# Patient Record
Sex: Male | Born: 1955 | Race: White | Hispanic: No | Marital: Married | Smoking: Former smoker
Health system: Southern US, Community
[De-identification: ages and names within clinical notes are randomized; demographics above are authoritative.]

## PROBLEM LIST (undated history)

## (undated) DIAGNOSIS — J449 Chronic obstructive pulmonary disease, unspecified: Secondary | ICD-10-CM

---

## 1998-10-09 ENCOUNTER — Encounter: Payer: Self-pay | Admitting: General Surgery

## 1998-10-09 ENCOUNTER — Inpatient Hospital Stay (HOSPITAL_COMMUNITY): Admission: EM | Admit: 1998-10-09 | Discharge: 1998-10-11 | Payer: Self-pay | Admitting: Emergency Medicine

## 1998-10-09 ENCOUNTER — Encounter: Payer: Self-pay | Admitting: Emergency Medicine

## 1999-01-22 ENCOUNTER — Emergency Department (HOSPITAL_COMMUNITY): Admission: EM | Admit: 1999-01-22 | Discharge: 1999-01-22 | Payer: Self-pay | Admitting: Emergency Medicine

## 1999-01-30 ENCOUNTER — Emergency Department (HOSPITAL_COMMUNITY): Admission: EM | Admit: 1999-01-30 | Discharge: 1999-01-30 | Payer: Self-pay | Admitting: Emergency Medicine

## 1999-02-13 ENCOUNTER — Emergency Department (HOSPITAL_COMMUNITY): Admission: EM | Admit: 1999-02-13 | Discharge: 1999-02-13 | Payer: Self-pay | Admitting: Emergency Medicine

## 1999-04-03 ENCOUNTER — Emergency Department (HOSPITAL_COMMUNITY): Admission: EM | Admit: 1999-04-03 | Discharge: 1999-04-03 | Payer: Self-pay | Admitting: Emergency Medicine

## 2006-07-14 ENCOUNTER — Emergency Department: Payer: Self-pay | Admitting: Emergency Medicine

## 2016-11-29 ENCOUNTER — Emergency Department
Admission: EM | Admit: 2016-11-29 | Discharge: 2016-11-29 | Disposition: A | Discharge status: Home / Self Care | Payer: Self-pay | Attending: Emergency Medicine | Admitting: Emergency Medicine

## 2016-11-29 ENCOUNTER — Emergency Department: Payer: Self-pay

## 2016-11-29 ENCOUNTER — Encounter: Payer: Self-pay | Admitting: Medical Oncology

## 2016-11-29 DIAGNOSIS — Z720 Tobacco use: Secondary | ICD-10-CM | POA: Insufficient documentation

## 2016-11-29 DIAGNOSIS — J40 Bronchitis, not specified as acute or chronic: Secondary | ICD-10-CM | POA: Insufficient documentation

## 2016-11-29 LAB — BASIC METABOLIC PANEL
Anion gap: 8 (ref 5–15)
BUN: 13 mg/dL (ref 6–20)
CALCIUM: 8.5 mg/dL — AB (ref 8.9–10.3)
CHLORIDE: 103 mmol/L (ref 101–111)
CO2: 24 mmol/L (ref 22–32)
CREATININE: 0.89 mg/dL (ref 0.61–1.24)
GFR calc non Af Amer: >60 mL/min (ref >60)
Glucose, Bld: 118 mg/dL — ABNORMAL HIGH (ref 65–99)
Potassium: 4.2 mmol/L (ref 3.5–5.1)
Sodium: 135 mmol/L (ref 135–145)

## 2016-11-29 LAB — CBC
HCT: 47.2 % (ref 40.0–52.0)
Hemoglobin: 16 g/dL (ref 13.0–18.0)
MCH: 32.6 pg (ref 26.0–34.0)
MCHC: 33.9 g/dL (ref 32.0–36.0)
MCV: 96.2 fL (ref 80.0–100.0)
PLATELETS: 250 10*3/uL (ref 150–440)
RBC: 4.91 MIL/uL (ref 4.40–5.90)
RDW: 12.9 % (ref 11.5–14.5)
WBC: 5.7 10*3/uL (ref 3.8–10.6)

## 2016-11-29 LAB — BRAIN NATRIURETIC PEPTIDE: B Natriuretic Peptide: 40 pg/mL (ref 0.0–100.0)

## 2016-11-29 LAB — TROPONIN I

## 2016-11-29 MED ORDER — ALBUTEROL SULFATE HFA 108 (90 BASE) MCG/ACT IN AERS: 2.0000 | INHALATION_SPRAY | Freq: Four times a day (QID) | RESPIRATORY_TRACT | 2 refills | Status: DC | PRN

## 2016-11-29 MED ORDER — IPRATROPIUM BROMIDE 0.02 % IN SOLN
0.5000 mg | Freq: Once | RESPIRATORY_TRACT | Status: AC
  Administered 2016-11-29: 0.5 mg via RESPIRATORY_TRACT
  Filled 2016-11-29: qty 2.5

## 2016-11-29 MED ORDER — AZITHROMYCIN 500 MG PO TABS
500.0000 mg | ORAL_TABLET | Freq: Once | ORAL | Status: AC
  Administered 2016-11-29: 500 mg via ORAL
  Filled 2016-11-29: qty 1

## 2016-11-29 MED ORDER — PREDNISONE 20 MG PO TABS
60.0000 mg | ORAL_TABLET | Freq: Once | ORAL | Status: AC
  Administered 2016-11-29: 60 mg via ORAL
  Filled 2016-11-29: qty 3

## 2016-11-29 MED ORDER — PREDNISONE 20 MG PO TABS: 60.0000 mg | ORAL_TABLET | Freq: Every day | ORAL | 0 refills | Status: AC

## 2016-11-29 MED ORDER — ALBUTEROL SULFATE (2.5 MG/3ML) 0.083% IN NEBU
5.0000 mg | INHALATION_SOLUTION | Freq: Once | RESPIRATORY_TRACT | Status: AC
  Administered 2016-11-29: 5 mg via RESPIRATORY_TRACT
  Filled 2016-11-29: qty 6

## 2016-11-29 MED ORDER — AZITHROMYCIN 250 MG PO TABS: ORAL_TABLET | ORAL | 0 refills | Status: AC

## 2016-11-29 NOTE — ED Triage Notes (Signed)
Pt reports he began yesterday having cough, congestion and sob. Pt denies pain.

## 2016-11-29 NOTE — ED Provider Notes (Addendum)
[facility] Emergency Department Provider Note  ____________________________________________   I have reviewed the triage vital signs and the nursing notes.   HISTORY  Chief Complaint Cough and Shortness of Breath    HPI Isaac Harrison is a 61 y.o. male presents today complaining of cough and shortness of breath. Cough is productive. He is a lifelong smoker. He does have a "smoker's cough" but since yesterday has been coughing more. Denies any chest pain or leg swelling. States that the cough is occasionally productive of yellowish sputum. Denies fever. Has no other URI symptoms at this time. Has not seen a doctor in 9 years. No leg swelling no recent travel no recent surgery no personal family history of PE or DVT   History reviewed. No pertinent past medical history.  There are no active problems to display for this patient.   No past surgical history on file.  Prior to Admission medications   Not on File    Allergies Patient has no known allergies.  No family history on file.  Social History Social History  Substance Use Topics  . Smoking status: Not on file  . Smokeless tobacco: Not on file  . Alcohol use Not on file    Review of Systems Constitutional: No fever/chills Eyes: No visual changes. ENT: No sore throat. No stiff neck no neck pain Cardiovascular: Denies chest pain. Respiratory: Positive cough and shortness of breath. Gastrointestinal:   no vomiting.  No diarrhea.  No constipation. Genitourinary: Negative for dysuria. Musculoskeletal: Negative lower extremity swelling Skin: Negative for rash. Neurological: Negative for severe headaches, focal weakness or numbness. 10-point ROS otherwise negative.  ____________________________________________   PHYSICAL EXAM:  VITAL SIGNS: ED Triage Vitals  Enc Vitals Group     BP 11/29/16 0812 (!) 167/87     Pulse Rate 11/29/16 0812 82     Resp 11/29/16 0812 20     Temp  11/29/16 0812 97.9 F (36.6 C)     Temp Source 11/29/16 0812 Oral     SpO2 11/29/16 0812 95 %     Weight 11/29/16 0810 160 lb (72.6 kg)     Height 11/29/16 0810 5\' 10"  (1.778 m)     Head Circumference --      Peak Flow --      Pain Score --      Pain Loc --      Pain Edu? --      Excl. in GC? --     Constitutional: Alert and oriented. Well appearing and in no acute distress. Eyes: Conjunctivae are normal. PERRL. EOMI. Head: Atraumatic. Nose: No congestion/rhinnorhea. Mouth/Throat: Mucous membranes are moist.  Oropharynx non-erythematous. Neck: No stridor.   Nontender with no meningismus Cardiovascular: Normal rate, regular rhythm. Grossly normal heart sounds.  Good peripheral circulation. Respiratory: Normal respiratory effort.  No retractions diminished in the bases with occasional wheeze noted. No rales or rhonchi Abdominal: Soft and nontender. No distention. No guarding no rebound Back:  There is no focal tenderness or step off.  there is no midline tenderness there are no lesions noted. there is no CVA tenderness Musculoskeletal: No lower extremity tenderness, no upper extremity tenderness. No joint effusions, no DVT signs strong distal pulses no edema Neurologic:  Normal speech and language. No gross focal neurologic deficits are appreciated.  Skin:  Skin is warm, dry and intact. No rash noted. Psychiatric: Mood and affect are normal. Speech and behavior are normal.  ____________________________________________   LABS (all labs ordered  are listed, but only abnormal results are displayed)  Labs Reviewed  CBC  BASIC METABOLIC PANEL  TROPONIN I  BRAIN NATRIURETIC PEPTIDE   ____________________________________________  EKG  I personally interpreted any EKGs ordered by me or triage Sinus rhythm at 77 bpm no acute ST elevation or depression nonspecific ST changes, RSR prime configuration noted. No acute  ischemia ____________________________________________  RADIOLOGY  I reviewed any imaging ordered by me or triage that were performed during my shift and, if possible, patient and/or family made aware of any abnormal findings. ____________________________________________   PROCEDURES  Procedure(s) performed: None  Procedures  Critical Care performed: None  ____________________________________________   INITIAL IMPRESSION / ASSESSMENT AND PLAN / ED COURSE  Pertinent labs & imaging results that were available during my care of the patient were reviewed by me and considered in my medical decision making (see chart for details).  *Left lung smoker with cough and wheeze today. Blood pressure mildly elevated but patient is anxious about being at the doctor. He does have some wheeze. Low suspicion for PE, patient doesn't really have any risk factors for that and he has not used cough which is productive etc. Low suspicion for ACS but given the patient's history of chronic tobacco abuse we will do blood work to ensure no evidence of acute occult ischemia don't think that he requires serial enzymes as he has had symptoms since yesterday with no chest pain. We will also send a BNP although otherwise I don't see any stigmata of CHF. Full it is negative we'll stress the need for close outpatient follow-up I will treat him empirically as a smoker with a atypical coverage antibiotic and steroids if he does not clear after the nebulizer I'm giving him. Vital signs are reassuring sats are good.  Smoking cessation, 6 minutes, provided.  ----------------------------------------- 9:19 AM on 11/29/2016 -----------------------------------------  Her treatment, patient's lungs are clear able to take deep breath he is clinically and subjectively feeling much better. We will give him steroids and a Z-Pak will continue to watch him here but sats are in the mid to high 90s which are probably baseline given  chronic smoking history and I am comfortable with discharge. No red phthisis suggest other acute pathology today patient understands he needs close outpatient follow-up.    ____________________________________________   FINAL CLINICAL IMPRESSION(S) / ED DIAGNOSES  Final diagnoses:  None      This chart was dictated using voice recognition software.  Despite best efforts to proofread,  errors can occur which can change meaning.      Jeanmarie PlantJames A McShane, MD 11/29/16 [id number]    Jeanmarie PlantJames A McShane, MD 11/29/16 [phone]

## 2017-09-26 ENCOUNTER — Other Ambulatory Visit: Payer: Self-pay

## 2017-09-26 ENCOUNTER — Emergency Department
Admission: EM | Admit: 2017-09-26 | Discharge: 2017-09-26 | Disposition: A | Discharge status: Home / Self Care | Payer: Self-pay | Attending: Emergency Medicine | Admitting: Emergency Medicine

## 2017-09-26 ENCOUNTER — Emergency Department: Payer: Self-pay

## 2017-09-26 DIAGNOSIS — J441 Chronic obstructive pulmonary disease with (acute) exacerbation: Secondary | ICD-10-CM | POA: Insufficient documentation

## 2017-09-26 MED ORDER — IPRATROPIUM-ALBUTEROL 0.5-2.5 (3) MG/3ML IN SOLN
3.0000 mL | Freq: Once | RESPIRATORY_TRACT | Status: AC
  Administered 2017-09-26: 3 mL via RESPIRATORY_TRACT
  Filled 2017-09-26: qty 3

## 2017-09-26 MED ORDER — SPACER/AERO CHAMBER MOUTHPIECE MISC: 1.0000 [IU] | 0 refills | Status: AC | PRN

## 2017-09-26 MED ORDER — AZITHROMYCIN 250 MG PO TABS: ORAL_TABLET | ORAL | 0 refills | Status: AC

## 2017-09-26 MED ORDER — PREDNISONE 50 MG PO TABS: 50.0000 mg | ORAL_TABLET | Freq: Every day | ORAL | 0 refills | Status: AC

## 2017-09-26 MED ORDER — ALBUTEROL SULFATE HFA 108 (90 BASE) MCG/ACT IN AERS: 2.0000 | INHALATION_SPRAY | Freq: Four times a day (QID) | RESPIRATORY_TRACT | 0 refills | Status: AC | PRN

## 2017-09-26 MED ORDER — PREDNISONE 20 MG PO TABS
60.0000 mg | ORAL_TABLET | Freq: Once | ORAL | Status: AC
  Administered 2017-09-26: 60 mg via ORAL
  Filled 2017-09-26: qty 3

## 2017-09-26 NOTE — ED Provider Notes (Signed)
[facility] Emergency Department Provider Note  ____________________________________________   First MD Initiated Contact with Patient 09/26/17 0145     (approximate)  I have reviewed the triage vital signs and the nursing notes.   HISTORY  Chief Complaint Shortness of Breath   HPI Isaac Harrison is a 62 y.o. male who self presents to the emergency department with insidious onset slowly progressive moderate severity shortness of breath that began last night.  He carries no formal diagnosis of COPD, although his primary care physician thinks that he may have it.  [facility] is [facility]  His symptoms seem to be worse with exertion and somewhat improved with rest.  He denies pain.  He denies productive cough.  He is a former smoker and quit about 6 months ago.  No past medical history on file.  There are no active problems to display for this patient.     Prior to Admission medications   Medication Sig Start Date End Date Taking? Authorizing Provider  albuterol (PROVENTIL HFA;VENTOLIN HFA) 108 (90 Base) MCG/ACT inhaler Inhale 2 puffs into the lungs every 6 (six) hours as needed for wheezing or shortness of breath. 09/26/17   Merrily Brittleifenbark, Sanjith Siwek, MD  azithromycin (ZITHROMAX Z-PAK) 250 MG tablet Take 2 tablets (500 mg) on  Day 1,  followed by 1 tablet (250 mg) once daily on Days 2 through 5. 09/26/17   Merrily Brittleifenbark, Rahel Carlton, MD  predniSONE (DELTASONE) 20 MG tablet Take 3 tablets (60 mg total) by mouth daily. 11/29/16   Jeanmarie PlantMcShane, James A, MD  predniSONE (DELTASONE) 50 MG tablet Take 1 tablet (50 mg total) by mouth daily for 4 days. 09/26/17 09/30/17  Merrily Brittleifenbark, Ronnica Dreese, MD  Spacer/Aero Chamber Mouthpiece MISC 1 Units by Does not apply route every 4 (four) hours as needed (wheezing). 09/26/17   Merrily Brittleifenbark, Ajane Novella, MD    Allergies Patient has no known allergies.  No family history on file.  Social History Social History    Tobacco Use  . Smoking status: Not on file  Substance Use Topics  . Alcohol use: Not on file  . Drug use: Not on file    Review of Systems Constitutional: No fever/chills Eyes: No visual changes. ENT: No sore throat. Cardiovascular: Denies chest pain. Respiratory: Positive for shortness of breath. Gastrointestinal: No abdominal pain.  No nausea, no vomiting.  No diarrhea.  No constipation. Genitourinary: Negative for dysuria. Musculoskeletal: Negative for back pain. Skin: Negative for rash. Neurological: Negative for headaches, focal weakness or numbness.   ____________________________________________   PHYSICAL EXAM:  VITAL SIGNS: ED Triage Vitals  Enc Vitals Group     BP 09/26/17 0139 (!) 134/104     Pulse Rate 09/26/17 0139 72     Resp 09/26/17 0139 (!) 23     Temp 09/26/17 0139 98.4 F (36.9 C)     Temp Source 09/26/17 0139 Oral     SpO2 09/26/17 0139 96 %     Weight 09/26/17 0140 162 lb (73.5 kg)     Height 09/26/17 0140 5\' 11"  (1.803 m)     Head Circumference --      Peak Flow --      Pain Score --      Pain Loc --      Pain Edu? --      Excl. in GC? --     Constitutional: Alert and oriented x4 appears somewhat uncomfortable elevated respiratory rate speaking in short sentences  Eyes: PERRL EOMI. Head: Atraumatic. Nose: No congestion/rhinnorhea. Mouth/Throat: No trismus Neck: No stridor.   Cardiovascular: Tachycardic rate, regular rhythm. Grossly normal heart sounds.  Good peripheral circulation. Respiratory: Increased respiratory rate mild accessory muscle use lungs are relatively quiet although some wheeze Gastrointestinal: Soft nontender Musculoskeletal: No lower extremity edema   Neurologic:  Normal speech and language. No gross focal neurologic deficits are appreciated. Skin:  Skin is warm, dry and intact. No rash noted. Psychiatric: Mood and affect are normal. Speech and behavior are normal.    ____________________________________________    DIFFERENTIAL includes but not limited to  Pneumonia, asthma, COPD exacerbation, pneumothorax, influenza ____________________________________________   LABS (all labs ordered are listed, but only abnormal results are displayed)  Labs Reviewed - No data to display   __________________________________________  EKG  ED ECG REPORT I, Merrily Brittle, the attending physician, personally viewed and interpreted this ECG.  Date: 09/26/2017 EKG Time:  Rate: 62 Rhythm: normal sinus rhythm QRS Axis: normal Intervals: normal ST/T Wave abnormalities: normal Narrative Interpretation: no evidence of acute ischemia _______________________________________  RADIOLOGY  Chest x-ray reviewed by me with no acute disease although is suggestive of emphysema ____________________________________________   PROCEDURES  Procedure(s) performed: no  Procedures  Critical Care performed: no  Observation: no ____________________________________________   INITIAL IMPRESSION / ASSESSMENT AND PLAN / ED COURSE  Pertinent labs & imaging results that were available during my care of the patient were reviewed by me and considered in my medical decision making (see chart for details).  On arrival the patient has an elevated work of breathing along with quiet lungs which is suggestive of reactive airway disease.  3 duo nebs as well as prednisone are     ----------------------------------------- 3:01 AM on 09/26/2017 -----------------------------------------  The patient's lungs are now clear and his shortness of breath is resolved.  His x-ray does show what is likely emphysema.  I had a lengthy discussion with the patient and his wife at bedside regarding his new diagnosis of COPD.  I will give him 4 more days of prednisone as well as Z-Pak.  He does have albuterol for home but does not have a spacer.  I have encouraged him to continue his cessation from smoking.  He does have follow-up 9 hours.  He  is discharged home in improved condition verbalizes understanding and agreement with the plan. ____________________________________________   FINAL CLINICAL IMPRESSION(S) / ED DIAGNOSES  Final diagnoses:  COPD exacerbation (HCC)      NEW MEDICATIONS STARTED DURING THIS VISIT:  New Prescriptions   AZITHROMYCIN (ZITHROMAX Z-PAK) 250 MG TABLET    Take 2 tablets (500 mg) on  Day 1,  followed by 1 tablet (250 mg) once daily on Days 2 through 5.   PREDNISONE (DELTASONE) 50 MG TABLET    Take 1 tablet (50 mg total) by mouth daily for 4 days.   SPACER/AERO CHAMBER MOUTHPIECE MISC    1 Units by Does not apply route every 4 (four) hours as needed (wheezing).     Note:  This document was prepared using Dragon voice recognition software and may include unintentional dictation errors.     Merrily Brittle, MD 09/26/17 [phone]

## 2017-09-26 NOTE — ED Triage Notes (Signed)
Pt arrived via POV from home d/t SOB that began yesterday. Pt reports having to breathe in a lot of smoke yesterday at work which lead to the SOB. Pt current O2 sat is 94% RA. Pt denies any medical hx or pain at this time. Pt is A&O x4.

## 2017-09-26 NOTE — Discharge Instructions (Signed)
Please take your steroids and antibiotics as prescribed.  It is critically important that you purchase a spacer for use with your inhaler as the inhaler does not work nearly as well without it.  Please keep your follow-up later on today as scheduled and return to the emergency department for any concerns.  It was a pleasure to take care of you today, and thank you for coming to our emergency department.  If you have any questions or concerns before leaving please ask the nurse to grab me and I'm more than happy to go through your aftercare instructions again.  If you were prescribed any opioid pain medication today such as Norco, Vicodin, Percocet, morphine, hydrocodone, or oxycodone please make sure you do not drive when you are taking this medication as it can alter your ability to drive safely.  If you have any concerns once you are home that you are not improving or are in fact getting worse before you can make it to your follow-up appointment, please do not hesitate to call [phone] and come back for further evaluation.  Merrily BrittleNeil Zacharius Funari, MD  Results for orders placed or performed during the hospital encounter of 11/29/16  Basic metabolic panel  Result Value Ref Range   Sodium 135 135 - 145 mmol/L   Potassium 4.2 3.5 - 5.1 mmol/L   Chloride 103 101 - 111 mmol/L   CO2 24 22 - 32 mmol/L   Glucose, Bld 118 (H) 65 - 99 mg/dL   BUN 13 6 - 20 mg/dL   Creatinine, Ser 9.600.89 0.61 - 1.24 mg/dL   Calcium 8.5 (L) 8.9 - 10.3 mg/dL   GFR calc non Af Amer >60 >60 mL/min   GFR calc Af Amer >60 >60 mL/min   Anion gap 8 5 - 15  CBC  Result Value Ref Range   WBC 5.7 3.8 - 10.6 K/uL   RBC 4.91 4.40 - 5.90 MIL/uL   Hemoglobin 16.0 13.0 - 18.0 g/dL   HCT 45.447.2 09.840.0 - 11.952.0 %   MCV 96.2 80.0 - 100.0 fL   MCH 32.6 26.0 - 34.0 pg   MCHC 33.9 32.0 - 36.0 g/dL   RDW 14.712.9 82.911.5 - 56.214.5 %   Platelets 250 150 - 440 K/uL  Troponin I  Result Value Ref Range   Troponin I <0.03 <0.03 ng/mL  Brain natriuretic peptide    Result Value Ref Range   B Natriuretic Peptide 40.0 0.0 - 100.0 pg/mL   Dg Chest Port 1 View  Result Date: 09/26/2017 CLINICAL DATA:  Cough and dyspnea EXAM: PORTABLE CHEST 1 VIEW COMPARISON:  11/29/2016 chest radiograph. FINDINGS: Stable cardiomediastinal silhouette with normal heart size. No pneumothorax. No pleural effusion. Suggestion of emphysema. No pulmonary edema. No acute consolidative airspace disease. IMPRESSION: No acute cardiopulmonary disease.  Suggestion of emphysema. Electronically Signed   By: Delbert PhenixJason A Poff M.D.   On: 09/26/2017 02:19

## 2017-11-19 IMAGING — CR DG CHEST 2V
1 series · 2 of 2 positions shown · non-contrast
Comparison: None.

CLINICAL DATA: Cough and congestion

EXAM:
CHEST  2 VIEW

[Series 1: dg chest 2 view · 0.14mm/px · 2 of 2 slices shown]
[im 1/2]
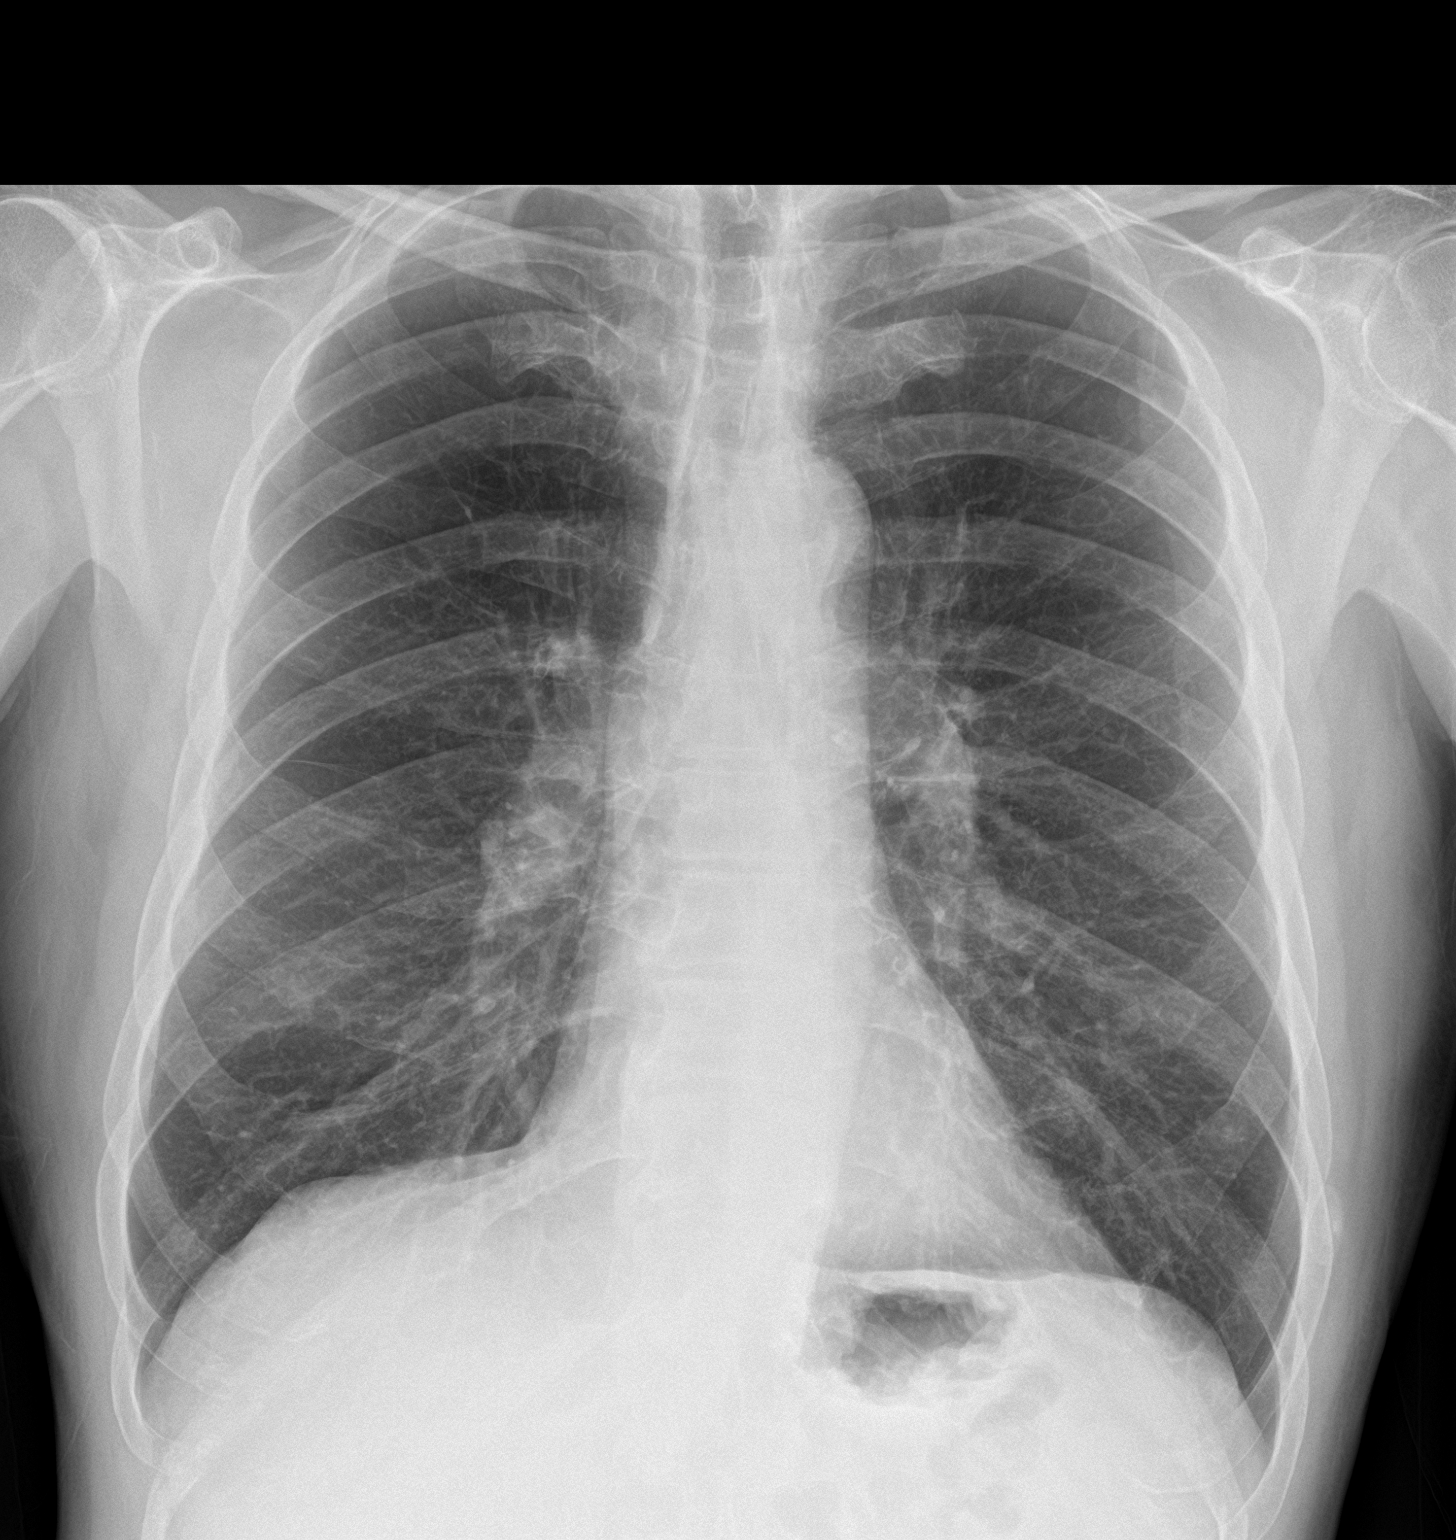
[im 2/2]
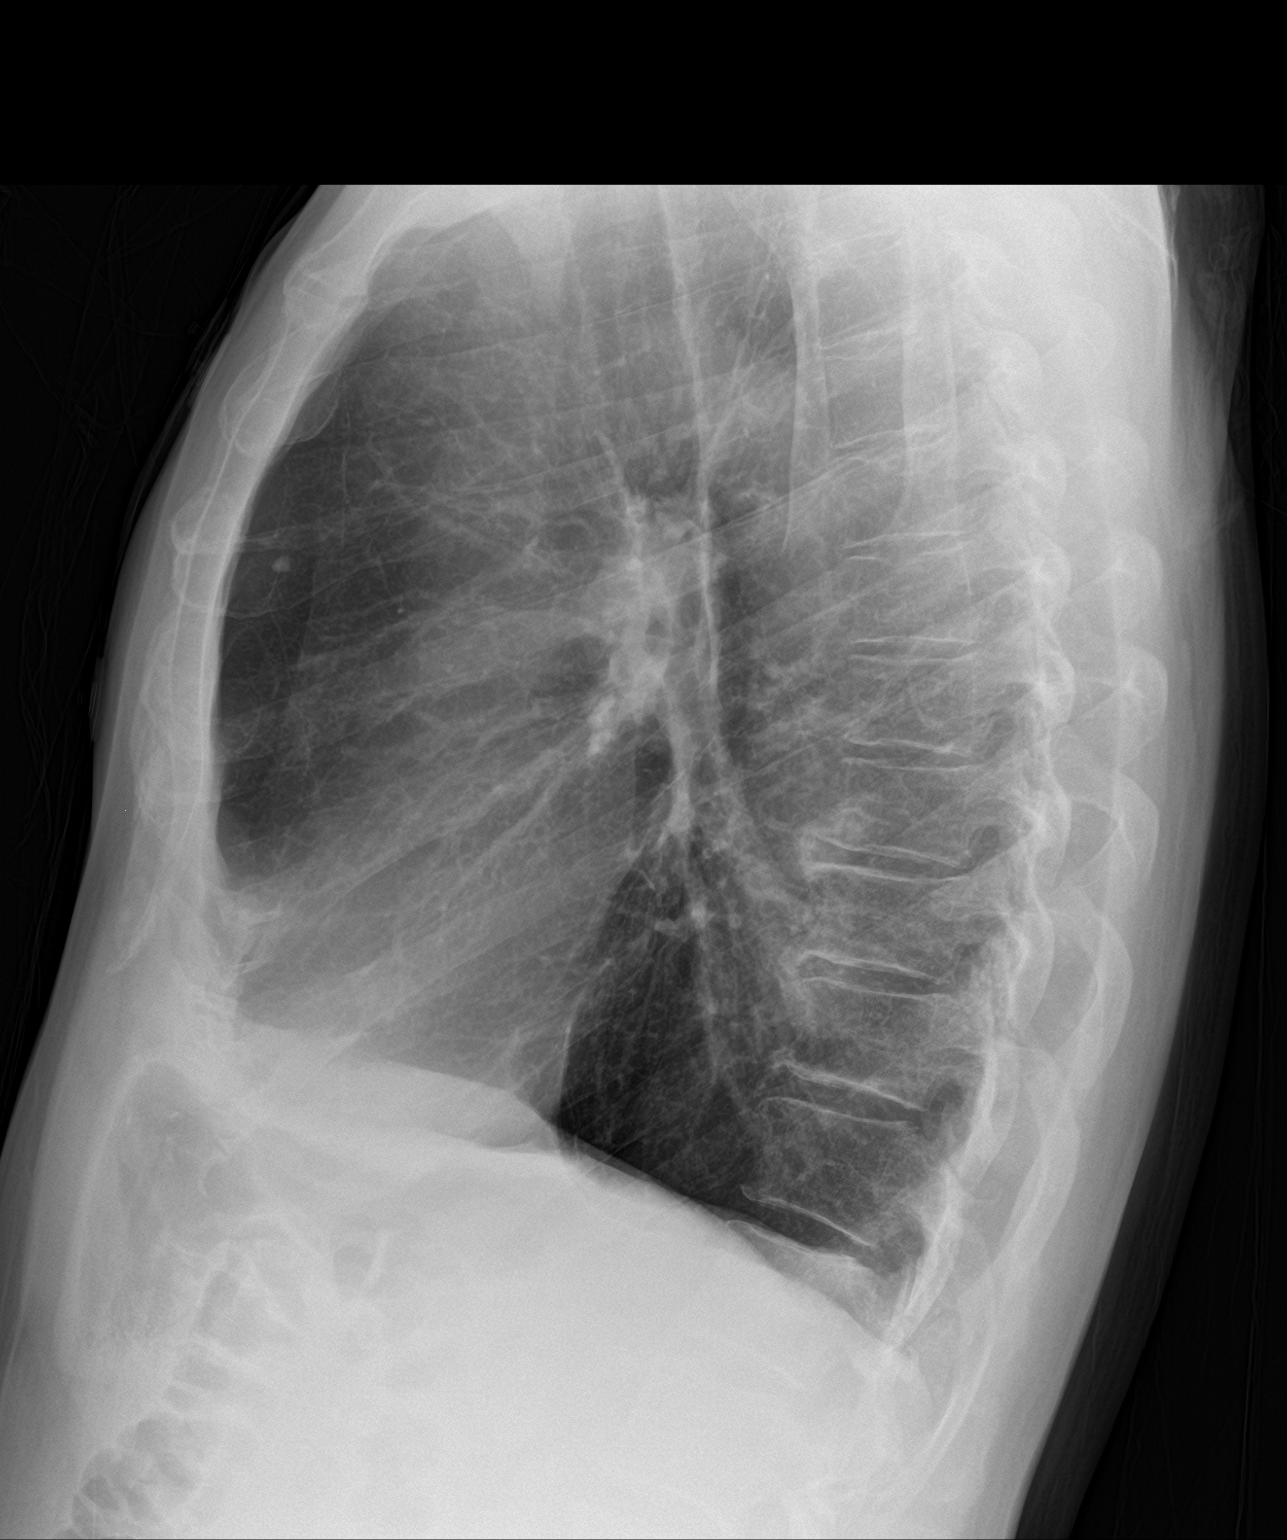

[2 of 2 positions shown; findings below may reference images not displayed]

FINDINGS: Cardiac shadow is within normal limits. The lungs are hyperinflated.
Calcified granuloma is noted best on the lateral projection
anteriorly. No focal infiltrate or sizable effusion is noted. Mild
degenerative changes of the thoracic spine are seen.
IMPRESSION: Changes of prior granulomatous disease.  No acute abnormality noted.

## 2018-04-07 ENCOUNTER — Encounter: Payer: Self-pay | Admitting: *Deleted

## 2018-04-07 ENCOUNTER — Emergency Department: Payer: Self-pay

## 2018-04-07 ENCOUNTER — Emergency Department
Admission: EM | Admit: 2018-04-07 | Discharge: 2018-04-07 | Disposition: A | Discharge status: Home / Self Care | Payer: Self-pay | Attending: Emergency Medicine | Admitting: Emergency Medicine

## 2018-04-07 ENCOUNTER — Other Ambulatory Visit: Payer: Self-pay

## 2018-04-07 DIAGNOSIS — R0602 Shortness of breath: Secondary | ICD-10-CM | POA: Insufficient documentation

## 2018-04-07 DIAGNOSIS — Z79899 Other long term (current) drug therapy: Secondary | ICD-10-CM | POA: Insufficient documentation

## 2018-04-07 DIAGNOSIS — J441 Chronic obstructive pulmonary disease with (acute) exacerbation: Secondary | ICD-10-CM

## 2018-04-07 DIAGNOSIS — J449 Chronic obstructive pulmonary disease, unspecified: Secondary | ICD-10-CM | POA: Insufficient documentation

## 2018-04-07 HISTORY — DX: Chronic obstructive pulmonary disease, unspecified: J44.9

## 2018-04-07 LAB — CBC WITH DIFFERENTIAL/PLATELET
Basophils Absolute: 0.1 10*3/uL (ref 0–0.1)
Basophils Relative: 1 %
Eosinophils Absolute: 0.6 10*3/uL (ref 0–0.7)
Eosinophils Relative: 8 %
HEMATOCRIT: 46.1 % (ref 40.0–52.0)
HEMOGLOBIN: 16.1 g/dL (ref 13.0–18.0)
LYMPHS ABS: 1.6 10*3/uL (ref 1.0–3.6)
Lymphocytes Relative: 19 %
MCH: 33.7 pg (ref 26.0–34.0)
MCHC: 34.9 g/dL (ref 32.0–36.0)
MCV: 96.5 fL (ref 80.0–100.0)
MONOS PCT: 9 %
Monocytes Absolute: 0.7 10*3/uL (ref 0.2–1.0)
NEUTROS ABS: 5.3 10*3/uL (ref 1.4–6.5)
NEUTROS PCT: 63 %
Platelets: 345 10*3/uL (ref 150–440)
RBC: 4.78 MIL/uL (ref 4.40–5.90)
RDW: 13 % (ref 11.5–14.5)
WBC: 8.3 10*3/uL (ref 3.8–10.6)

## 2018-04-07 LAB — COMPREHENSIVE METABOLIC PANEL
ALBUMIN: 4.4 g/dL (ref 3.5–5.0)
ALT: 31 U/L (ref 0–44)
AST: 23 U/L (ref 15–41)
Alkaline Phosphatase: 50 U/L (ref 38–126)
Anion gap: 6 (ref 5–15)
BUN: 20 mg/dL (ref 8–23)
CHLORIDE: 105 mmol/L (ref 98–111)
CO2: 28 mmol/L (ref 22–32)
CREATININE: 1.05 mg/dL (ref 0.61–1.24)
Calcium: 8.9 mg/dL (ref 8.9–10.3)
Glucose, Bld: 115 mg/dL — ABNORMAL HIGH (ref 70–99)
POTASSIUM: 4.2 mmol/L (ref 3.5–5.1)
SODIUM: 139 mmol/L (ref 135–145)
Total Bilirubin: 0.8 mg/dL (ref 0.3–1.2)
Total Protein: 7.8 g/dL (ref 6.5–8.1)

## 2018-04-07 LAB — TROPONIN I

## 2018-04-07 LAB — BRAIN NATRIURETIC PEPTIDE: B Natriuretic Peptide: 25 pg/mL (ref 0.0–100.0)

## 2018-04-07 MED ORDER — ALBUTEROL SULFATE (2.5 MG/3ML) 0.083% IN NEBU
5.0000 mg | INHALATION_SOLUTION | Freq: Once | RESPIRATORY_TRACT | Status: AC
  Administered 2018-04-07: 5 mg via RESPIRATORY_TRACT
  Filled 2018-04-07: qty 6

## 2018-04-07 MED ORDER — PREDNISONE 20 MG PO TABS: 60.0000 mg | ORAL_TABLET | Freq: Every day | ORAL | 0 refills | Status: AC

## 2018-04-07 NOTE — Discharge Instructions (Addendum)
Return to the emergency room for any new or worsening symptoms, any shortness of breath, chest pain, wheezing or other concerns.  Do not smoke anymore, [facility].

## 2018-04-07 NOTE — Progress Notes (Signed)
Called by ED MD to trial patient off of BIPAP.  Patient placed on 2lpm [place] and oxygen saturations 93%. Patient tolerating well. RN aware.

## 2018-04-07 NOTE — ED Provider Notes (Addendum)
[facility] Emergency Department Provider Note  ____________________________________________   I have reviewed the triage vital signs and the nursing notes. Where available I have reviewed prior notes and, if possible and indicated, outside hospital notes.    HISTORY  Chief Complaint Shortness of Breath    HPI Isaac Harrison is a 62 y.o. male history of significant COPD, began to have wheezing and cough yesterday.  According to family this is a event that happens almost every week usually goes to the [place] but today he was too sick to get there.  Called 911 they found him significantly labored with very poor movement of air, they gave him subcu epinephrine in addition to albuterol and Atrovent and Solu-Medrol, patient has no chest pain or productive cough, does wheeze. Has fever, nothing makes it better nothing makes it worse, no other prior treatment.   No past medical history on file.  There are no active problems to display for this patient.     Prior to Admission medications   Medication Sig Start Date End Date Taking? Authorizing Provider  albuterol (PROVENTIL HFA;VENTOLIN HFA) 108 (90 Base) MCG/ACT inhaler Inhale 2 puffs into the lungs every 6 (six) hours as needed for wheezing or shortness of breath. 09/26/17   Merrily Brittle, MD  azithromycin (ZITHROMAX Z-PAK) 250 MG tablet Take 2 tablets (500 mg) on  Day 1,  followed by 1 tablet (250 mg) once daily on Days 2 through 5. 09/26/17   Merrily Brittle, MD  predniSONE (DELTASONE) 20 MG tablet Take 3 tablets (60 mg total) by mouth daily. 11/29/16   Jeanmarie Plant, MD  Spacer/Aero Chamber Mouthpiece MISC 1 Units by Does not apply route every 4 (four) hours as needed (wheezing). 09/26/17   Merrily Brittle, MD    Allergies Morphine and related  No family history on file.  Social History Social History   Tobacco Use  . Smoking status: Not on file  Substance Use Topics  . Alcohol use: Not on file  . Drug  use: Not on file    Review of Systems Constitutional: No fever/chills Eyes: No visual changes. ENT: No sore throat. No stiff neck no neck pain Cardiovascular: Denies chest pain. Respiratory: + shortness of breath. Gastrointestinal:   no vomiting.  No diarrhea.  No constipation. Genitourinary: Negative for dysuria. Musculoskeletal: Negative lower extremity swelling Skin: Negative for rash. Neurological: Negative for severe headaches, focal weakness or numbness.   ____________________________________________   PHYSICAL EXAM:  VITAL SIGNS: ED Triage Vitals [04/07/18 1623]  Enc Vitals Group     BP (!) 149/103     Pulse Rate (!) 104     Resp (!) 24     Temp 98.6 F (37 C)     Temp Source Oral     SpO2 94 %     Weight      Height      Head Circumference      Peak Flow      Pain Score      Pain Loc      Pain Edu?      Excl. in GC?     Constitutional: Alert oriented, patient breathing with pursed lips exhalations, 3 word dyspnea  Eyes: Conjunctivae are normal Head: Atraumatic HEENT: No congestion/rhinnorhea. Mucous membranes are moist.  Oropharynx non-erythematous Neck:   Nontender with no meningismus, no masses, no stridor Cardiovascular: Normal rate, regular rhythm. Grossly normal heart sounds.  Good peripheral circulation. Respiratory: Respiratory rate and effort, pursed lips breathing,  wheezes appreciated diffusely no rales or rhonchi Abdominal: Soft and nontender. No distention. No guarding no rebound Back:  There is no focal tenderness or step off.  there is no midline tenderness there are no lesions noted. there is no CVA tenderness Musculoskeletal: No lower extremity tenderness, no upper extremity tenderness. No joint effusions, no DVT signs strong distal pulses no edema Neurologic:  Normal speech and language. No gross focal neurologic deficits are appreciated.  Skin:  Skin is warm, dry and intact. No rash noted. Psychiatric: Mood and affect are normal. Speech  and behavior are normal.  ____________________________________________   LABS (all labs ordered are listed, but only abnormal results are displayed)  Labs Reviewed  CBC WITH DIFFERENTIAL/PLATELET  BLOOD GAS, VENOUS  COMPREHENSIVE METABOLIC PANEL  TROPONIN I  BRAIN NATRIURETIC PEPTIDE    Pertinent labs  results that were available during my care of the patient were reviewed by me and considered in my medical decision making (see chart for details). ____________________________________________  EKG  I personally interpreted any EKGs ordered by me or triage Sinus tachycardia rate 102, right bundle branch block no acute ST elevation or depression, normal axis ____________________________________________  RADIOLOGY  Pertinent labs & imaging results that were available during my care of the patient were reviewed by me and considered in my medical decision making (see chart for details). If possible, patient and/or family made aware of any abnormal findings.  Dg Chest Port 1 View  Result Date: 04/07/2018 CLINICAL DATA:  Respiratory distress. EXAM: PORTABLE CHEST 1 VIEW COMPARISON:  09/26/2017. FINDINGS: Mediastinum and hilar structures normal. Heart size normal. Lungs are clear. No pleural effusion or pneumothorax. IMPRESSION: No acute cardiopulmonary disease. Electronically Signed   By: Maisie Fushomas  Register   On: 04/07/2018 16:44   ____________________________________________    PROCEDURES  Procedure(s) performed: None  Procedures  Critical Care performed: CRITICAL CARE Performed by: Jeanmarie PlantJAMES A Delois Silvester   Total critical care time: 45 minutes  Critical care time was exclusive of separately billable procedures and treating other patients.  Critical care was necessary to treat or prevent imminent or life-threatening deterioration.  Critical care was time spent personally by me on the following activities: development of treatment plan with patient and/or surrogate as well as  nursing, discussions with consultants, evaluation of patient's response to treatment, examination of patient, obtaining history from patient or surrogate, ordering and performing treatments and interventions, ordering and review of laboratory studies, ordering and review of radiographic studies, pulse oximetry and re-evaluation of patient's condition.   ____________________________________________   INITIAL IMPRESSION / ASSESSMENT AND PLAN / ED COURSE  Pertinent labs & imaging results that were available during my care of the patient were reviewed by me and considered in my medical decision making (see chart for details). She was very ill-appearing for EMS had extensive interventions from them, did appear to be doing somewhat better for us although still quite toxic in appearance with pursed lip breathing and fatigue apparent, we asked him immediately on BiPAP.  He is doing much better on BiPAP.  VBG is reassuring CBC is reassuring, chest x-ray to my read shows nothing but COPD, radiology agrees.  No pneumothorax.  EKG is reassuring.  This is all the hallmarks of a classic COPD flare, I do not think indication exists necessarily for antibiotics no fever no white count, no productive sputum,   ----------------------------------------- 6:33 PM on 04/07/2018 -----------------------------------------  Patient doing much better resting comfortably legs crossed watching TV tolerating BiPAP well lungs are clear at this  time we will see if we can wean him off the BiPAP and see if he needs to be shipped to the [place].  ----------------------------------------- 7:27 PM on 04/07/2018 -----------------------------------------  Patient feels that his breathing is at baseline, he is very comfortable and would prefer not to go to the [place] if it is not 100% indicated he is where they will just send him home.  Sats are in the low 90s here 93 at this time.  We will see if he can ambulate without significant  desaturation as he is not on home oxygen.  ----------------------------------------- 7:57 PM on 04/07/2018 -----------------------------------------  She ambulates without desatted, sats in the low to mid 90s, no respiratory distress lungs are clear, we discussed transfer to [place], he would very much prefer to go home.  I do not think is unreasonable.  Return precautions and follow-up given and understood.     ____________________________________________   FINAL CLINICAL IMPRESSION(S) / ED DIAGNOSES  Final diagnoses:  SOB (shortness of breath)      This chart was dictated using voice recognition software.  Despite best efforts to proofread,  errors can occur which can change meaning.      Jeanmarie Plant, MD 04/07/18 1654    Jeanmarie Plant, MD 04/07/18 9629    Jeanmarie Plant, MD 04/07/18 Serena Croissant    Jeanmarie Plant, MD 04/07/18 [phone]

## 2018-04-07 NOTE — ED Triage Notes (Addendum)
Per EMS report[facility] and has taken several duoneb treatments and used his rescue inhaler. Patient has a history of COPD, but is not on O2 at home. [place]. Patient was given a duo neb treatment and 125mg  Solu-medrol at his home by EMTs at 1545, and was given 2 duo neb treatments 0.5 Epinephrine 1:1 SubQ en route by EMTs. Patient arrived on nasal cannula with aerosol mask in place with duo neb treatment. Patient was 94% on room air upon arrival. Patient is unable to speak in complete sentences.

## 2018-04-07 NOTE — ED Notes (Signed)
O2  Removed by Dr Alphonzo LemmingsMcShane  Monitor pulse ox

## 2018-04-07 NOTE — ED Notes (Signed)
Patient placed on bipap

## 2018-04-07 NOTE — ED Notes (Signed)
Pt ambulated in room short distance  With pulse  Ox between 90 - 92  Per cent some very mild distress

## 2018-04-28 LAB — BLOOD GAS, VENOUS
ACID-BASE DEFICIT: 0.6 mmol/L (ref 0.0–2.0)
BICARBONATE: 27.1 mmol/L (ref 20.0–28.0)
PATIENT TEMPERATURE: 37
PCO2 VEN: 55 mmHg (ref 44.0–60.0)
pH, Ven: 7.3 (ref 7.250–7.430)

## 2019-03-28 IMAGING — DX DG CHEST 1V PORT
2 series · 2 of 2 positions shown · non-contrast
Comparison: 09/26/2017.

CLINICAL DATA: Respiratory distress.

EXAM:
PORTABLE CHEST 1 VIEW

[chest ap (1 of 2)]
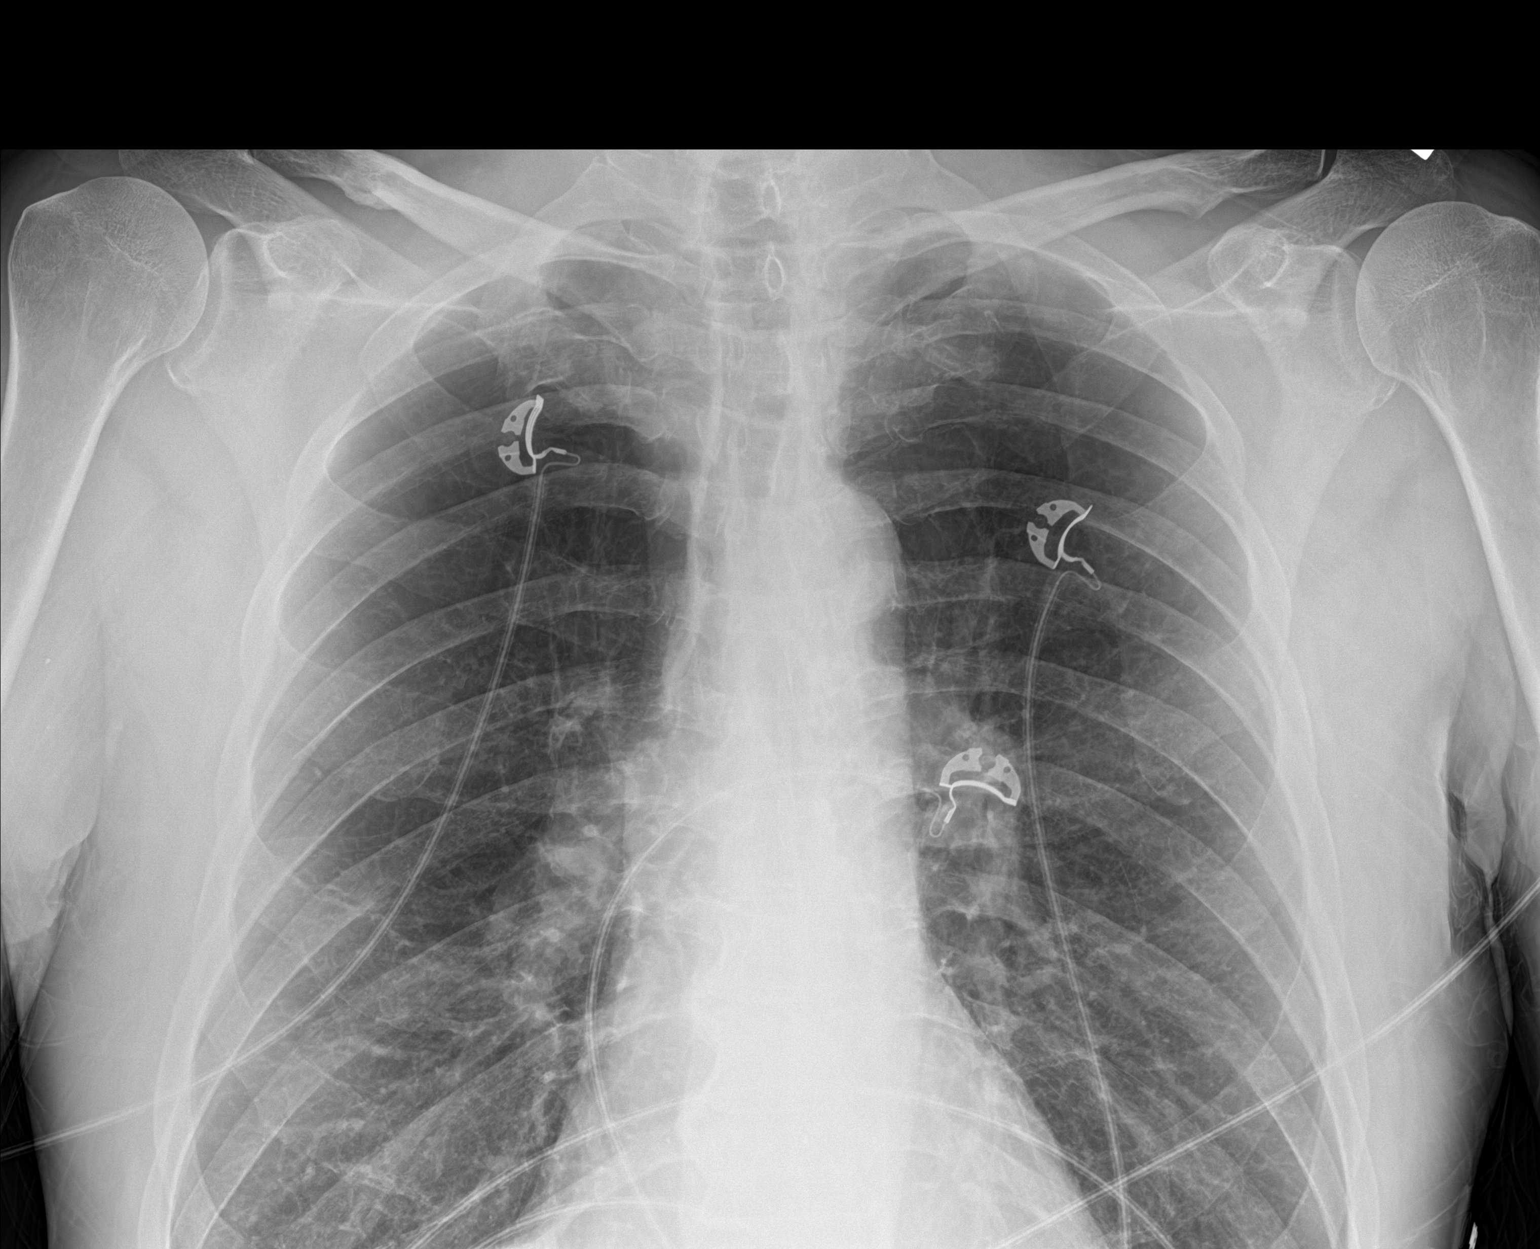

[chest ap (2 of 2)]
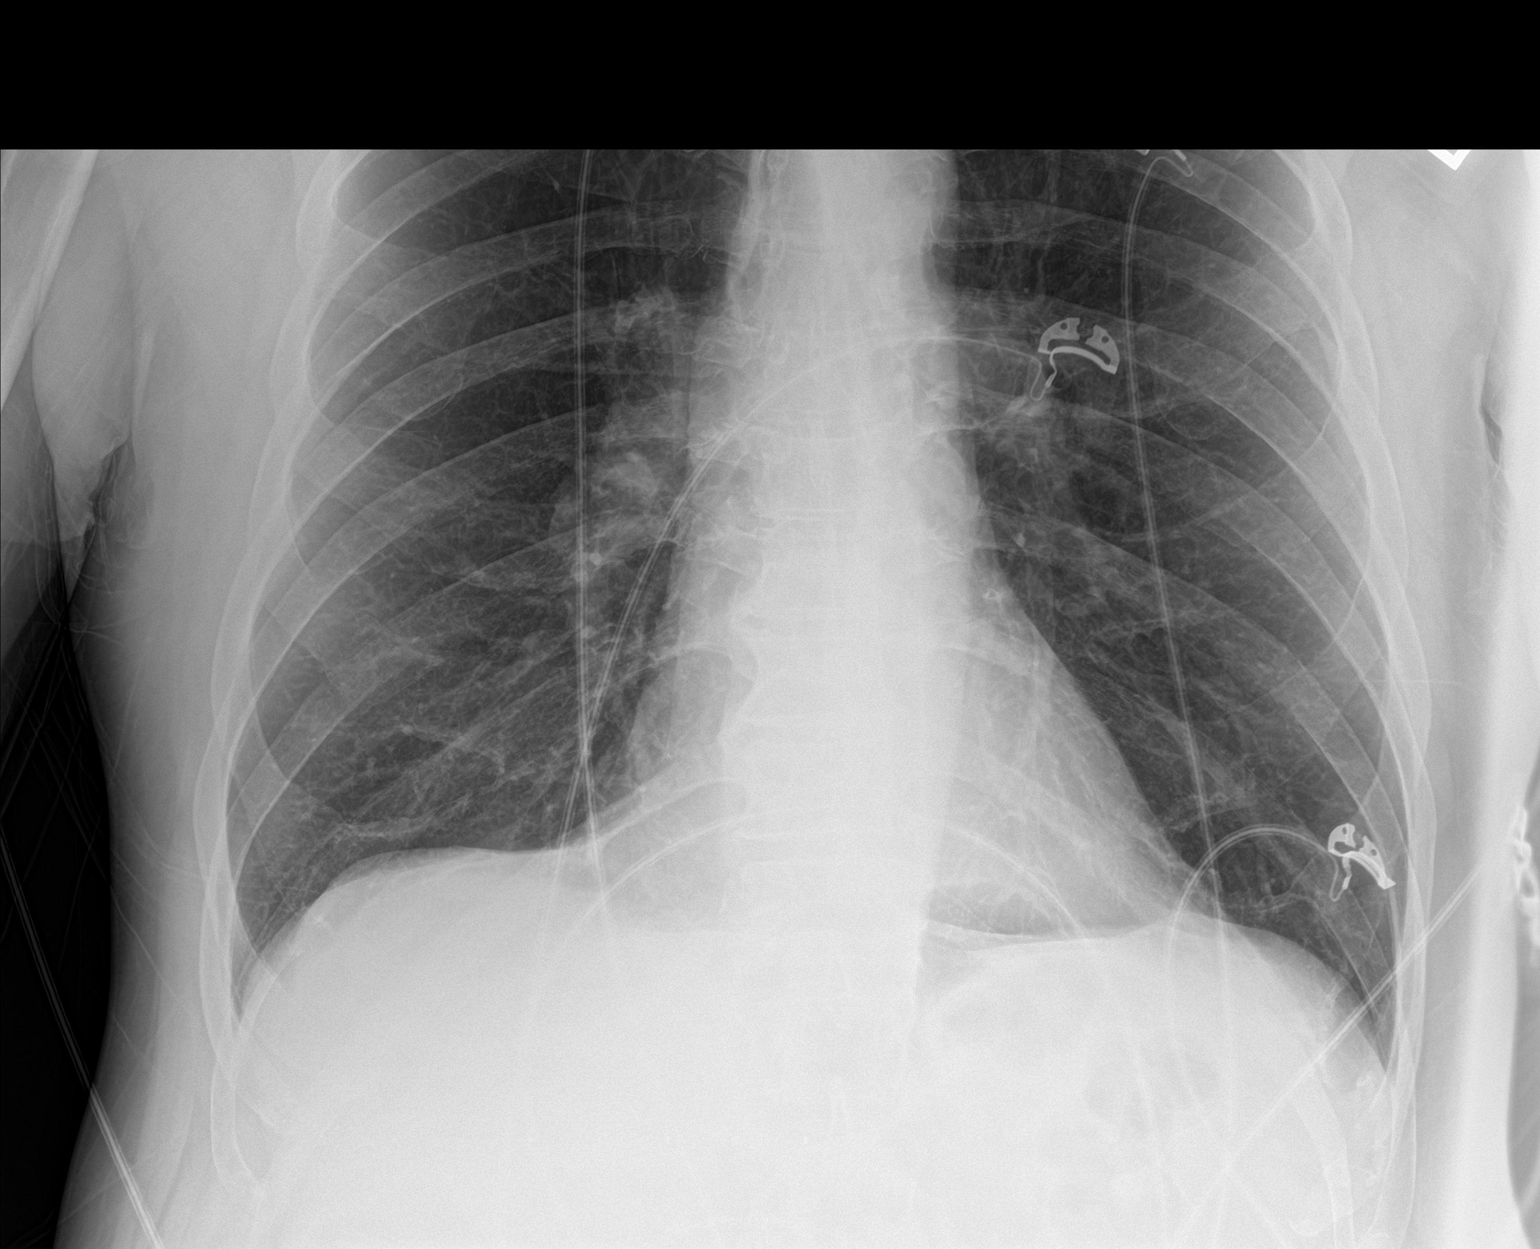

[2 of 2 positions shown; findings below may reference images not displayed]

FINDINGS: Mediastinum and hilar structures normal. Heart size normal. Lungs
are clear. No pleural effusion or pneumothorax.
IMPRESSION: No acute cardiopulmonary disease.

## 2021-05-18 ENCOUNTER — Encounter: Payer: Self-pay | Admitting: *Deleted

## 2021-05-18 ENCOUNTER — Other Ambulatory Visit: Payer: Self-pay

## 2021-05-18 ENCOUNTER — Encounter: Payer: No Typology Code available for payment source | Attending: Pulmonary Disease | Admitting: *Deleted

## 2021-05-18 DIAGNOSIS — J449 Chronic obstructive pulmonary disease, unspecified: Secondary | ICD-10-CM | POA: Insufficient documentation

## 2021-05-18 NOTE — Progress Notes (Signed)
Virtual orientation call completed today. he has an appointment on Date: 05/24/2021  for EP eval and gym Orientation.  Documentation of diagnosis can be found in [facility] Date: 08/02/2022[name] has recently quit tobacco use within the last 6 months. Intervention for relapse prevention was provided at the initial medical review. He was encouraged to continue to with tobacco cessation and was provided information on relapse prevention. Patient received information about combination therapy, tobacco cessation classes, quit line, and quit smoking apps in case of a relapse. Patient demonstrated understanding of this material.Staff will continue to provide encouragement and follow up with the patient throughout the program.

## 2021-05-24 ENCOUNTER — Other Ambulatory Visit: Payer: Self-pay

## 2021-05-24 VITALS — Ht 71.5 in | Wt 176.9 lb

## 2021-05-24 DIAGNOSIS — J449 Chronic obstructive pulmonary disease, unspecified: Secondary | ICD-10-CM | POA: Diagnosis not present

## 2021-05-24 NOTE — Patient Instructions (Signed)
Patient Instructions  Patient Details  Name: Isaac Harrison MRN: [id number] Date of Birth: 1956/04/15 Referring Provider:  Sabra Heck, MD  Below are your personal goals for exercise, nutrition, and risk factors. Our goal is to help you stay on track towards obtaining and maintaining these goals. We will be discussing your progress on these goals with you throughout the program.  Initial Exercise Prescription:  Initial Exercise Prescription - 05/24/21 1400       Date of Initial Exercise RX and Referring Provider   Date 05/24/21    Referring Provider Nena Alexander ([facility])      Recumbant Bike   Level 1    RPM 60    Watts 25    Minutes 15    METs 3      REL-XR   Level 1    Speed 50    Minutes 15    METs 3      T5 Nustep   Level 1    SPM 80    Minutes 15    METs 3      Track   Laps 17    Minutes 15    METs 1.92      Prescription Details   Frequency (times per week) 2    Duration Progress to 30 minutes of continuous aerobic without signs/symptoms of physical distress      Intensity   THRR 40-80% of Max Heartrate 103-136    Ratings of Perceived Exertion 11-13    Perceived Dyspnea 0-4      Progression   Progression Continue to progress workloads to maintain intensity without signs/symptoms of physical distress.      Resistance Training   Training Prescription Yes    Weight 3 lb    Reps 10-15             Exercise Goals: Frequency: Be able to perform aerobic exercise two to three times per week in program working toward 2-5 days per week of home exercise.  Intensity: Work with a perceived exertion of 11 (fairly light) - 15 (hard) while following your exercise prescription.  We will make changes to your prescription with you as you progress through the program.   Duration: Be able to do 30 to 45 minutes of continuous aerobic exercise in addition to a 5 minute warm-up and a 5 minute cool-down routine.   Nutrition Goals: Your personal nutrition  goals will be established when you do your nutrition analysis with the dietician.  The following are general nutrition guidelines to follow: Cholesterol < 200mg /day Sodium < 1500mg /day Fiber: Men over 50 yrs - 30 grams per day  Personal Goals:  Personal Goals and Risk Factors at Admission - 05/24/21 1418       Core Components/Risk Factors/Patient Goals on Admission    Weight Management Yes;Weight Loss   Patient goal; BMI < 25   Intervention Weight Management: Develop a combined nutrition and exercise program designed to reach desired caloric intake, while maintaining appropriate intake of nutrient and fiber, sodium and fats, and appropriate energy expenditure required for the weight goal.;Weight Management: Provide education and appropriate resources to help participant work on and attain dietary goals.;Weight Management/Obesity: Establish reasonable short term and long term weight goals.    Admit Weight 176 lb (79.8 kg)    Goal Weight: Short Term 171 lb (77.6 kg)    Goal Weight: Long Term 165 lb (74.8 kg)    Expected Outcomes Short Term: Continue to assess and modify interventions until  short term weight is achieved;Long Term: Adherence to nutrition and physical activity/exercise program aimed toward attainment of established weight goal;Weight Loss: Understanding of general recommendations for a balanced deficit meal plan, which promotes 1-2 lb weight loss per week and includes a negative energy balance of [id number] kcal/d;Understanding recommendations for meals to include 15-35% energy as protein, 25-35% energy from fat, 35-60% energy from carbohydrates, less than 200mg  of dietary cholesterol, 20-35 gm of total fiber daily;Understanding of distribution of calorie intake throughout the day with the consumption of 4-5 meals/snacks    Tobacco Cessation Yes    Number of packs per day Timtohy has recently quit tobacco use within the last 6 months. Intervention for relapse prevention was provided at  the initial medical review. He was encouraged to continue to with tobacco cessation and was provided information on relapse prevention. Patient received information about combination therapy, tobacco cessation classes, quit line, and quit smoking apps in case of a relapse. Patient demonstrated understanding of this material.Staff will continue to provide encouragement and follow up with the patient throughout the program.    Intervention Assist the participant in steps to quit. Provide individualized education and counseling about committing to Tobacco Cessation, relapse prevention, and pharmacological support that can be provided by physician.;Casimiro Needle, assist with locating and accessing local/national Quit Smoking programs, and support quit date choice.    Expected Outcomes Short Term: Will demonstrate readiness to quit, by selecting a quit date.;Short Term: Will quit all tobacco product use, adhering to prevention of relapse plan.;Long Term: Complete abstinence from all tobacco products for at least 12 months from quit date.    Improve shortness of breath with ADL's Yes    Intervention Provide education, individualized exercise plan and daily activity instruction to help decrease symptoms of SOB with activities of daily living.    Expected Outcomes Short Term: Improve cardiorespiratory fitness to achieve a reduction of symptoms when performing ADLs;Long Term: Be able to perform more ADLs without symptoms or delay the onset of symptoms    Increase knowledge of respiratory medications and ability to use respiratory devices properly  Yes    Intervention Provide education and demonstration as needed of appropriate use of medications, inhalers, and oxygen therapy.    Expected Outcomes Short Term: Achieves understanding of medications use. Understands that oxygen is a medication prescribed by physician. Demonstrates appropriate use of inhaler and oxygen therapy.;Long Term: Maintain  appropriate use of medications, inhalers, and oxygen therapy.    Intervention Provide education on lifestyle modifcations including regular physical activity/exercise, weight management, moderate sodium restriction and increased consumption of fresh fruit, vegetables, and low fat dairy, alcohol moderation, and smoking cessation.;Monitor prescription use compliance.    Expected Outcomes Short Term: Continued assessment and intervention until BP is < 140/47mm HG in hypertensive participants. < 130/3mm HG in hypertensive participants with diabetes, heart failure or chronic kidney disease.;Long Term: Maintenance of blood pressure at goal levels.             Tobacco Use Initial Evaluation: Social History   Tobacco Use  Smoking Status Former   Packs/day: 1.50   Years: 35.00   Pack years: 52.50   Types: Cigarettes   Quit date: 03/09/2021   Years since quitting: 0.2  Smokeless Tobacco Never  Tobacco Comments   Quit   not smoking any at all    Exercise Goals and Review:  Exercise Goals     Row Name 05/24/21 1410  Exercise Goals   Increase Physical Activity Yes       Intervention Provide advice, education, support and counseling about physical activity/exercise needs.;Develop an individualized exercise prescription for aerobic and resistive training based on initial evaluation findings, risk stratification, comorbidities and participant's personal goals.       Expected Outcomes Short Term: Attend rehab on a regular basis to increase amount of physical activity.;Long Term: Add in home exercise to make exercise part of routine and to increase amount of physical activity.;Long Term: Exercising regularly at least 3-5 days a week.       Increase Strength and Stamina Yes       Intervention Provide advice, education, support and counseling about physical activity/exercise needs.;Develop an individualized exercise prescription for aerobic and resistive training based on initial  evaluation findings, risk stratification, comorbidities and participant's personal goals.       Expected Outcomes Short Term: Increase workloads from initial exercise prescription for resistance, speed, and METs.;Short Term: Perform resistance training exercises routinely during rehab and add in resistance training at home;Long Term: Improve cardiorespiratory fitness, muscular endurance and strength as measured by increased METs and functional capacity ( )       Able to understand and use rate of perceived exertion (RPE) scale Yes       Intervention Provide education and explanation on how to use RPE scale       Expected Outcomes Short Term: Able to use RPE daily in rehab to express subjective intensity level;Long Term:  Able to use RPE to guide intensity level when exercising independently       Able to understand and use Dyspnea scale Yes       Intervention Provide education and explanation on how to use Dyspnea scale       Expected Outcomes Short Term: Able to use Dyspnea scale daily in rehab to express subjective sense of shortness of breath during exertion;Long Term: Able to use Dyspnea scale to guide intensity level when exercising independently       Knowledge and understanding of Target Heart Rate Range (THRR) Yes       Intervention Provide education and explanation of THRR including how the numbers were predicted and where they are located for reference       Expected Outcomes Long Term: Able to use THRR to govern intensity when exercising independently;Short Term: Able to state/look up THRR;Short Term: Able to use daily as guideline for intensity in rehab       Able to check pulse independently Yes       Intervention Provide education and demonstration on how to check pulse in carotid and radial arteries.;Review the importance of being able to check your own pulse for safety during independent exercise       Expected Outcomes Short Term: Able to explain why pulse checking is important  during independent exercise;Long Term: Able to check pulse independently and accurately       Understanding of Exercise Prescription Yes       Intervention Provide education, explanation, and written materials on patient's individual exercise prescription       Expected Outcomes Short Term: Able to explain program exercise prescription;Long Term: Able to explain home exercise prescription to exercise independently                Copy of goals given to participant.

## 2021-05-24 NOTE — Progress Notes (Addendum)
Pulmonary Individual Treatment Plan  Patient Details  Name: Isaac Harrison MRN: [id number] Date of Birth: [birth year] Referring Provider:   Flowsheet Row Pulmonary Rehab from 05/24/2021 in [facility] Cardiac and Pulmonary Rehab  Referring Provider Nena Alexander [facility])       Initial Encounter Date:  Flowsheet Row Pulmonary Rehab from 05/24/2021 in [facility] Cardiac and Pulmonary Rehab  Date 05/24/21       Visit Diagnosis: Chronic obstructive pulmonary disease, unspecified COPD type (HCC)  Patient's Home Medications on Admission:  Current Outpatient Medications:    albuterol (PROVENTIL HFA;VENTOLIN HFA) 108 (90 Base) MCG/ACT inhaler, Inhale 2 puffs into the lungs every 6 (six) hours as needed for wheezing or shortness of breath., Disp: 1 Inhaler, Rfl: 0   albuterol (PROVENTIL) (2.5 MG/3ML) 0.083% nebulizer solution, USE 1 AMPULE BY ORAL INHALATION FOUR TIMES A DAY AS NEEDED FOR BREATHING / SHORTNESS OF BREATH, Disp: , Rfl:    amLODipine (NORVASC) 10 MG tablet, Take by mouth., Disp: , Rfl:    azithromycin (ZITHROMAX Z-PAK) 250 MG tablet, Take 2 tablets (500 mg) on  Day 1,  followed by 1 tablet (250 mg) once daily on Days 2 through 5. (Patient not taking: Reported on 04/07/2018), Disp: 6 each, Rfl: 0   Budeson-Glycopyrrol-Formoterol (BREZTRI AEROSPHERE) 160-9-4.8 MCG/ACT AERO, Inhale 2 Inhalers into the lungs 2 (two) times daily., Disp: , Rfl:    fluticasone (FLONASE) 50 MCG/ACT nasal spray, INSTILL 2 SPRAYS INTO EACH NOSTRIL ONCE EVERY DAY MAXIMUM 2 SPRAYS IN EACH NOSTRIL DAILY. FOR ALLERGIES, Disp: , Rfl:    guaiFENesin (MUCINEX) 600 MG 12 hr tablet, Take 1 tablet by mouth 2 (two) times daily., Disp: , Rfl:    predniSONE (DELTASONE) 20 MG tablet, Take 3 tablets (60 mg total) by mouth daily., Disp: 12 tablet, Rfl: 0   predniSONE (DELTASONE) 20 MG tablet, Take 3 tablets (60 mg total) by mouth daily. (Patient not taking: Reported on 05/18/2021), Disp: 12 tablet, Rfl: 0   Spacer/Aero Chamber Mouthpiece  MISC, 1 Units by Does not apply route every 4 (four) hours as needed (wheezing). (Patient not taking: Reported on 04/07/2018), Disp: 1 each, Rfl: 0  Past Medical History: Past Medical History:  Diagnosis Date   COPD (chronic obstructive pulmonary disease) (HCC)     Tobacco Use: Social History   Tobacco Use  Smoking Status Former   Packs/day: 1.50   Years: 35.00   Pack years: 52.50   Types: Cigarettes   Quit date: 03/09/2021   Years since quitting: 0.2  Smokeless Tobacco Never  Tobacco Comments   Quit   not smoking any at all   Khyre has recently quit tobacco use within the last 6 months. Intervention for relapse prevention was provided at the initial medical review. He was encouraged to continue to with tobacco cessation and was provided information on relapse prevention. Patient received information about combination therapy, tobacco cessation classes, quit line, and quit smoking apps in case of a relapse. Patient demonstrated understanding of this material.Staff will continue to provide encouragement and follow up with the patient throughout the program.      Labs: Recent Review Flowsheet Data     Labs for ITP Cardiac and Pulmonary Rehab Latest Ref Rng & Units 04/07/2018   HCO3 20.0 - 28.0 mmol/L 27.1   ACIDBASEDEF 0.0 - 2.0 mmol/L 0.6        Pulmonary Assessment Scores:  Pulmonary Assessment Scores     Row Name 05/24/21 1154         ADL  UCSD   ADL Phase Entry     SOB Score total 52     Rest 1     Walk 3     Stairs 4     Bath 3     Dress 3     Shop 3           CAT Score   CAT Score 29           mMRC Score   mMRC Score 2.5              UCSD: Self-administered rating of dyspnea associated with activities of daily living (ADLs) 6-point scale (0 = "not at all" to 5 = "maximal or unable to do because of breathlessness")  Scoring Scores range from 0 to 120.  Minimally important difference is 5 units  CAT: CAT can identify the health impairment of  COPD patients and is better correlated with disease progression.  CAT has a scoring range of zero to 40. The CAT score is classified into four groups of low (less than 10), medium (10 - 20), high (21-30) and very high (31-40) based on the impact level of disease on health status. A CAT score over 10 suggests significant symptoms.  A worsening CAT score could be explained by an exacerbation, poor medication adherence, poor inhaler technique, or progression of COPD or comorbid conditions.  CAT MCID is 2 points  mMRC: mMRC (Modified Medical Research Council) Dyspnea Scale is used to assess the degree of baseline functional disability in patients of respiratory disease due to dyspnea. No minimal important difference is established. A decrease in score of 1 point or greater is considered a positive change.   Pulmonary Function Assessment:   Exercise Target Goals: Exercise Program Goal: Individual exercise prescription set using results from initial 6 min walk test and THRR while considering  patient's activity barriers and safety.   Exercise Prescription Goal: Initial exercise prescription builds to 30-45 minutes a day of aerobic activity, 2-3 days per week.  Home exercise guidelines will be given to patient during program as part of exercise prescription that the participant will acknowledge.  Education: Aerobic Exercise: - Group verbal and visual presentation on the components of exercise prescription. Introduces F.I.T.T principle from ACSM for exercise prescriptions.  Reviews F.I.T.T. principles of aerobic exercise including progression. Written material given at graduation.   Education: Resistance Exercise: - Group verbal and visual presentation on the components of exercise prescription. Introduces F.I.T.T principle from ACSM for exercise prescriptions  Reviews F.I.T.T. principles of resistance exercise including progression. Written material given at graduation.    Education: Exercise &  Equipment Safety: - Individual verbal instruction and demonstration of equipment use and safety with use of the equipment. Flowsheet Row Pulmonary Rehab from 05/24/2021 in [facility] Cardiac and Pulmonary Rehab  Education need identified 05/24/21  Date 05/24/21  Educator KL  Instruction Review Code 1- Verbalizes Understanding       Education: Exercise Physiology & General Exercise Guidelines: - Group verbal and written instruction with models to review the exercise physiology of the cardiovascular system and associated critical values. Provides general exercise guidelines with specific guidelines to those with heart or lung disease.    Education: Flexibility, Balance, Mind/Body Relaxation: - Group verbal and visual presentation with interactive activity on the components of exercise prescription. Introduces F.I.T.T principle from ACSM for exercise prescriptions. Reviews F.I.T.T. principles of flexibility and balance exercise training including progression. Also discusses the mind body connection.  Reviews various relaxation techniques to  help reduce and manage stress (i.e. Deep breathing, progressive muscle relaxation, and visualization). Balance handout provided to take home. Written material given at graduation.   Activity Barriers & Risk Stratification:  Activity Barriers & Cardiac Risk Stratification - 05/24/21 1202       Activity Barriers & Cardiac Risk Stratification   Activity Barriers Shortness of Breath;Deconditioning             6 Minute Walk:  6 Minute Walk     Row Name 05/24/21 1408         6 Minute Walk   Phase Initial     Distance 975 feet     Walk Time 6 minutes     # of Rest Breaks 0     MPH 1.84     METS 3.02     RPE 11     Perceived Dyspnea  2     VO2 Peak 10.57     Symptoms Yes (comment)     Comments SOB     Resting HR 71 bpm     Resting BP 144/74     Resting Oxygen Saturation  95 %     Exercise Oxygen Saturation  during 6 min walk 89 %     Max Ex.  HR 100 bpm     Max Ex. BP 164/76     2 Minute Post BP 144/74           Interval HR   1 Minute HR 92     2 Minute HR 93     3 Minute HR 95     4 Minute HR 100     5 Minute HR 97     6 Minute HR 98     2 Minute Post HR 78     Interval Heart Rate? Yes           Interval Oxygen   Interval Oxygen? Yes     Baseline Oxygen Saturation % 95 %     1 Minute Oxygen Saturation % 92 %     1 Minute Liters of Oxygen 0 L  RA     2 Minute Oxygen Saturation % 93 %     2 Minute Liters of Oxygen 0 L     3 Minute Oxygen Saturation % 91 %     3 Minute Liters of Oxygen 0 L     4 Minute Oxygen Saturation % 89 %     4 Minute Liters of Oxygen 0 L     5 Minute Oxygen Saturation % 90 %     5 Minute Liters of Oxygen 0 L     6 Minute Oxygen Saturation % 89 %     6 Minute Liters of Oxygen 0 L     2 Minute Post Oxygen Saturation % 96 %     2 Minute Post Liters of Oxygen 0 L             Oxygen Initial Assessment:  Oxygen Initial Assessment - 05/24/21 1154       Home Oxygen   Home Oxygen Device None    Sleep Oxygen Prescription None    Home Exercise Oxygen Prescription None    Home Resting Oxygen Prescription None      Initial 6 min Walk   Oxygen Used None      Program Oxygen Prescription   Program Oxygen Prescription None      Intervention   Short Term Goals To learn and demonstrate  proper pursed lip breathing techniques or other breathing techniques. ;To learn and understand importance of maintaining oxygen saturations>88%;To learn and demonstrate proper use of respiratory medications;To learn and understand importance of monitoring SPO2 with pulse oximeter and demonstrate accurate use of the pulse oximeter.    Long  Term Goals Verbalizes importance of monitoring SPO2 with pulse oximeter and return demonstration;Maintenance of O2 saturations>88%;Exhibits proper breathing techniques, such as pursed lip breathing or other method taught during program session;Compliance with respiratory  medication;Demonstrates proper use of MDI's             Oxygen Re-Evaluation:   Oxygen Discharge (Final Oxygen Re-Evaluation):   Initial Exercise Prescription:  Initial Exercise Prescription - 05/24/21 1400       Date of Initial Exercise RX and Referring Provider   Date 05/24/21    Referring Provider Nena Alexander ([place])      Recumbant Bike   Level 1    RPM 60    Watts 25    Minutes 15    METs 3      REL-XR   Level 1    Speed 50    Minutes 15    METs 3      T5 Nustep   Level 1    SPM 80    Minutes 15    METs 3      Track   Laps 17    Minutes 15    METs 1.92      Prescription Details   Frequency (times per week) 2    Duration Progress to 30 minutes of continuous aerobic without signs/symptoms of physical distress      Intensity   THRR 40-80% of Max Heartrate 103-136    Ratings of Perceived Exertion 11-13    Perceived Dyspnea 0-4      Progression   Progression Continue to progress workloads to maintain intensity without signs/symptoms of physical distress.      Resistance Training   Training Prescription Yes    Weight 3 lb    Reps 10-15             Perform Capillary Blood Glucose checks as needed.  Exercise Prescription Changes:   Exercise Prescription Changes     Row Name 05/24/21 1400             Response to Exercise   Blood Pressure (Admit) 144/74       Blood Pressure (Exercise) 164/76       Blood Pressure (Exit) 144/74       Heart Rate (Admit) 71 bpm       Heart Rate (Exercise) 100 bpm       Heart Rate (Exit) 72 bpm       Oxygen Saturation (Admit) 95 %       Oxygen Saturation (Exercise) 89 %       Oxygen Saturation (Exit) 96 %       Rating of Perceived Exertion (Exercise) 11       Perceived Dyspnea (Exercise) 2       Symptoms SOB       Comments walk test results                Exercise Comments:   Exercise Goals and Review:   Exercise Goals     Row Name 05/24/21 1410             Exercise Goals    Increase Physical Activity Yes       Intervention Provide advice, education,  support and counseling about physical activity/exercise needs.;Develop an individualized exercise prescription for aerobic and resistive training based on initial evaluation findings, risk stratification, comorbidities and participant's personal goals.       Expected Outcomes Short Term: Attend rehab on a regular basis to increase amount of physical activity.;Long Term: Add in home exercise to make exercise part of routine and to increase amount of physical activity.;Long Term: Exercising regularly at least 3-5 days a week.       Increase Strength and Stamina Yes       Intervention Provide advice, education, support and counseling about physical activity/exercise needs.;Develop an individualized exercise prescription for aerobic and resistive training based on initial evaluation findings, risk stratification, comorbidities and participant's personal goals.       Expected Outcomes Short Term: Increase workloads from initial exercise prescription for resistance, speed, and METs.;Short Term: Perform resistance training exercises routinely during rehab and add in resistance training at home;Long Term: Improve cardiorespiratory fitness, muscular endurance and strength as measured by increased METs and functional capacity ( )       Able to understand and use rate of perceived exertion (RPE) scale Yes       Intervention Provide education and explanation on how to use RPE scale       Expected Outcomes Short Term: Able to use RPE daily in rehab to express subjective intensity level;Long Term:  Able to use RPE to guide intensity level when exercising independently       Able to understand and use Dyspnea scale Yes       Intervention Provide education and explanation on how to use Dyspnea scale       Expected Outcomes Short Term: Able to use Dyspnea scale daily in rehab to express subjective sense of shortness of breath during  exertion;Long Term: Able to use Dyspnea scale to guide intensity level when exercising independently       Knowledge and understanding of Target Heart Rate Range (THRR) Yes       Intervention Provide education and explanation of THRR including how the numbers were predicted and where they are located for reference       Expected Outcomes Long Term: Able to use THRR to govern intensity when exercising independently;Short Term: Able to state/look up THRR;Short Term: Able to use daily as guideline for intensity in rehab       Able to check pulse independently Yes       Intervention Provide education and demonstration on how to check pulse in carotid and radial arteries.;Review the importance of being able to check your own pulse for safety during independent exercise       Expected Outcomes Short Term: Able to explain why pulse checking is important during independent exercise;Long Term: Able to check pulse independently and accurately       Understanding of Exercise Prescription Yes       Intervention Provide education, explanation, and written materials on patient's individual exercise prescription       Expected Outcomes Short Term: Able to explain program exercise prescription;Long Term: Able to explain home exercise prescription to exercise independently                Exercise Goals Re-Evaluation :   Discharge Exercise Prescription (Final Exercise Prescription Changes):  Exercise Prescription Changes - 05/24/21 1400       Response to Exercise   Blood Pressure (Admit) 144/74    Blood Pressure (Exercise) 164/76    Blood Pressure (Exit) 144/74  Heart Rate (Admit) 71 bpm    Heart Rate (Exercise) 100 bpm    Heart Rate (Exit) 72 bpm    Oxygen Saturation (Admit) 95 %    Oxygen Saturation (Exercise) 89 %    Oxygen Saturation (Exit) 96 %    Rating of Perceived Exertion (Exercise) 11    Perceived Dyspnea (Exercise) 2    Symptoms SOB    Comments walk test results              Nutrition:  Target Goals: Understanding of nutrition guidelines, daily intake of sodium 1500mg , cholesterol 200mg , calories 30% from fat and 7% or less from saturated fats, daily to have 5 or more servings of fruits and vegetables.  Education: All About Nutrition: -Group instruction provided by verbal, written material, interactive activities, discussions, models, and posters to present general guidelines for heart healthy nutrition including fat, fiber, MyPlate, the role of sodium in heart healthy nutrition, utilization of the nutrition label, and utilization of this knowledge for meal planning. Follow up email sent as well. Written material given at graduation.   Biometrics:  Pre Biometrics - 05/24/21 1201       Pre Biometrics   Height 5' 11.5" (1.816 m)    Weight 176 lb 14.4 oz (80.2 kg)    BMI (Calculated) 24.33    Single Leg Stand 30 seconds              Nutrition Therapy Plan and Nutrition Goals:  Nutrition Therapy & Goals - 05/24/21 1208       Intervention Plan   Intervention Prescribe, educate and counsel regarding individualized specific dietary modifications aiming towards targeted core components such as weight, hypertension, lipid management, diabetes, heart failure and other comorbidities.    Expected Outcomes Short Term Goal: Understand basic principles of dietary content, such as calories, fat, sodium, cholesterol and nutrients.;Short Term Goal: A plan has been developed with personal nutrition goals set during dietitian appointment.;Long Term Goal: Adherence to prescribed nutrition plan.             Nutrition Assessments:  MEDIFICTS Score Key: ?70 Need to make dietary changes  40-70 Heart Healthy Diet ? 40 Therapeutic Level Cholesterol Diet  Flowsheet Row Pulmonary Rehab from 05/24/2021 in [facility] Cardiac and Pulmonary Rehab  Picture Your Plate Total Score on Admission 54      Picture Your Plate Scores: <02 Unhealthy dietary pattern with much  room for improvement. 41-50 Dietary pattern unlikely to meet recommendations for good health and room for improvement. 51-60 More healthful dietary pattern, with some room for improvement.  >60 Healthy dietary pattern, although there may be some specific behaviors that could be improved.   Nutrition Goals Re-Evaluation:   Nutrition Goals Discharge (Final Nutrition Goals Re-Evaluation):   Psychosocial: Target Goals: Acknowledge presence or absence of significant depression and/or stress, maximize coping skills, provide positive support system. Participant is able to verbalize types and ability to use techniques and skills needed for reducing stress and depression.   Education: Stress, Anxiety, and Depression - Group verbal and visual presentation to define topics covered.  Reviews how body is impacted by stress, anxiety, and depression.  Also discusses healthy ways to reduce stress and to treat/manage anxiety and depression.  Written material given at graduation.   Education: Sleep Hygiene -Provides group verbal and written instruction about how sleep can affect your health.  Define sleep hygiene, discuss sleep cycles and impact of sleep habits. Review good sleep hygiene tips.    Initial Review &  Psychosocial Screening:  Initial Psych Review & Screening - 05/18/21 1025       Initial Review   Current issues with None Identified      Family Dynamics   Good Support System? Yes   wife, few friends     Barriers   Psychosocial barriers to participate in program There are no identifiable barriers or psychosocial needs.;The patient should benefit from training in stress management and relaxation.      Screening Interventions   Interventions Encouraged to exercise;Provide feedback about the scores to participant;To provide support and resources with identified psychosocial needs    Expected Outcomes Short Term goal: Utilizing psychosocial counselor, staff and physician to assist with  identification of specific Stressors or current issues interfering with healing process. Setting desired goal for each stressor or current issue identified.;Long Term Goal: Stressors or current issues are controlled or eliminated.;Short Term goal: Identification and review with participant of any Quality of Life or Depression concerns found by scoring the questionnaire.;Long Term goal: The participant improves quality of Life and PHQ9 Scores as seen by post scores and/or verbalization of changes             Quality of Life Scores:  Scores of 19 and below usually indicate a poorer quality of life in these areas.  A difference of  2-3 points is a clinically meaningful difference.  A difference of 2-3 points in the total score of the Quality of Life Index has been associated with significant improvement in overall quality of life, self-image, physical symptoms, and general health in studies assessing change in quality of life.  PHQ-9: Recent Review Flowsheet Data     Depression screen [facility] 2/9 05/24/2021   Decreased Interest 1   Down, Depressed, Hopeless 0   PHQ - 2 Score 1   Altered sleeping 1   Tired, decreased energy 2   Change in appetite 0   Feeling bad or failure about yourself  0   Trouble concentrating 0   Moving slowly or fidgety/restless 1   Suicidal thoughts 0   PHQ-9 Score 5   Difficult doing work/chores Not difficult at all      Interpretation of Total Score  Total Score Depression Severity:  1-4 = Minimal depression, 5-9 = Mild depression, 10-14 = Moderate depression, 15-19 = Moderately severe depression, 20-27 = Severe depression   Psychosocial Evaluation and Intervention:  Psychosocial Evaluation - 05/18/21 1044       Psychosocial Evaluation & Interventions   Interventions Encouraged to exercise with the program and follow exercise prescription    Comments MIke has no barriers to attending the program. He is looking forward to improving his stamina and decreasing  his shortness of breath. He lives with his wife and their 2 dogs.   At the moment he can only walk his dogs a short distance. HIs goal is to be able to walk further. HIs wife and some friends are his support. He does not like to share his health issues with others.  He is going to be a subject in a study being done at [facility].  He is hoping this will help with his breathing.    Expected Outcomes STG; MIke will be able to attend all his scheduled sessions and gain some more stamina and decrease his shortness of breath. LTG Kathlene November will continue to keep his progress after discharge    Continue Psychosocial Services  Follow up required by staff  Psychosocial Re-Evaluation:   Psychosocial Discharge (Final Psychosocial Re-Evaluation):   Education: Education Goals: Education classes will be provided on a weekly basis, covering required topics. Participant will state understanding/return demonstration of topics presented.  Learning Barriers/Preferences:  Learning Barriers/Preferences - 05/18/21 1027       Learning Barriers/Preferences   Learning Barriers None    Learning Preferences None             General Pulmonary Education Topics:  Infection Prevention: - Provides verbal and written material to individual with discussion of infection control including proper hand washing and proper equipment cleaning during exercise session. Flowsheet Row Pulmonary Rehab from 05/24/2021 in [facility] Cardiac and Pulmonary Rehab  Education need identified 05/24/21  Date 05/24/21  Educator KL  Instruction Review Code 1- Verbalizes Understanding       Falls Prevention: - Provides verbal and written material to individual with discussion of falls prevention and safety. Flowsheet Row Pulmonary Rehab from 05/24/2021 in [facility] Cardiac and Pulmonary Rehab  Education need identified 05/24/21  Date 05/24/21  Educator KL  Instruction Review Code 1- Verbalizes Understanding       Chronic Lung  Disease Review: - Group verbal instruction with posters, models, PowerPoint presentations and videos,  to review new updates, new respiratory medications, new advancements in procedures and treatments. Providing information on websites and "800" numbers for continued self-education. Includes information about supplement oxygen, available portable oxygen systems, continuous and intermittent flow rates, oxygen safety, concentrators, and Medicare reimbursement for oxygen. Explanation of Pulmonary Drugs, including class, frequency, complications, importance of spacers, rinsing mouth after steroid MDI's, and proper cleaning methods for nebulizers. Review of basic lung anatomy and physiology related to function, structure, and complications of lung disease. Review of risk factors. Discussion about methods for diagnosing sleep apnea and types of masks and machines for OSA. Includes a review of the use of types of environmental controls: home humidity, furnaces, filters, dust mite/pet prevention, HEPA vacuums. Discussion about weather changes, air quality and the benefits of nasal washing. Instruction on Warning signs, infection symptoms, calling MD promptly, preventive modes, and value of vaccinations. Review of effective airway clearance, coughing and/or vibration techniques. Emphasizing that all should Create an Action Plan. Written material given at graduation. Flowsheet Row Pulmonary Rehab from 05/24/2021 in [facility] Cardiac and Pulmonary Rehab  Education need identified 05/24/21       AED/CPR: - Group verbal and written instruction with the use of models to demonstrate the basic use of the AED with the basic ABC's of resuscitation.    Anatomy and Cardiac Procedures: - Group verbal and visual presentation and models provide information about basic cardiac anatomy and function. Reviews the testing methods done to diagnose heart disease and the outcomes of the test results. Describes the treatment choices:  Medical Management, Angioplasty, or Coronary Bypass Surgery for treating various heart conditions including Myocardial Infarction, Angina, Valve Disease, and Cardiac Arrhythmias.  Written material given at graduation.   Medication Safety: - Group verbal and visual instruction to review commonly prescribed medications for heart and lung disease. Reviews the medication, class of the drug, and side effects. Includes the steps to properly store meds and maintain the prescription regimen.  Written material given at graduation.   Other: -Provides group and verbal instruction on various topics (see comments)   Knowledge Questionnaire Score:  Knowledge Questionnaire Score - 05/24/21 1208       Knowledge Questionnaire Score   Pre Score 17/18: Oxygen  Core Components/Risk Factors/Patient Goals at Admission:  Personal Goals and Risk Factors at Admission - 05/24/21 1418       Core Components/Risk Factors/Patient Goals on Admission    Weight Management Yes;Weight Loss   Patient goal; BMI < 25   Intervention Weight Management: Develop a combined nutrition and exercise program designed to reach desired caloric intake, while maintaining appropriate intake of nutrient and fiber, sodium and fats, and appropriate energy expenditure required for the weight goal.;Weight Management: Provide education and appropriate resources to help participant work on and attain dietary goals.;Weight Management/Obesity: Establish reasonable short term and long term weight goals.    Admit Weight 176 lb (79.8 kg)    Goal Weight: Short Term 171 lb (77.6 kg)    Goal Weight: Long Term 165 lb (74.8 kg)    Expected Outcomes Short Term: Continue to assess and modify interventions until short term weight is achieved;Long Term: Adherence to nutrition and physical activity/exercise program aimed toward attainment of established weight goal;Weight Loss: Understanding of general recommendations for a balanced deficit  meal plan, which promotes 1-2 lb weight loss per week and includes a negative energy balance of [phone] kcal/d;Understanding recommendations for meals to include 15-35% energy as protein, 25-35% energy from fat, 35-60% energy from carbohydrates, less than 200mg  of dietary cholesterol, 20-35 gm of total fiber daily;Understanding of distribution of calorie intake throughout the day with the consumption of 4-5 meals/snacks    Tobacco Cessation Yes    Number of packs per day Marqus has recently quit tobacco use within the last 6 months. Intervention for relapse prevention was provided at the initial medical review. He was encouraged to continue to with tobacco cessation and was provided information on relapse prevention. Patient received information about combination therapy, tobacco cessation classes, quit line, and quit smoking apps in case of a relapse. Patient demonstrated understanding of this material.Staff will continue to provide encouragement and follow up with the patient throughout the program.    Intervention Assist the participant in steps to quit. Provide individualized education and counseling about committing to Tobacco Cessation, relapse prevention, and pharmacological support that can be provided by physician.;Casimiro Needle, assist with locating and accessing local/national Quit Smoking programs, and support quit date choice.    Expected Outcomes Short Term: Will demonstrate readiness to quit, by selecting a quit date.;Short Term: Will quit all tobacco product use, adhering to prevention of relapse plan.;Long Term: Complete abstinence from all tobacco products for at least 12 months from quit date.    Improve shortness of breath with ADL's Yes    Intervention Provide education, individualized exercise plan and daily activity instruction to help decrease symptoms of SOB with activities of daily living.    Expected Outcomes Short Term: Improve cardiorespiratory fitness to achieve  a reduction of symptoms when performing ADLs;Long Term: Be able to perform more ADLs without symptoms or delay the onset of symptoms    Increase knowledge of respiratory medications and ability to use respiratory devices properly  Yes    Intervention Provide education and demonstration as needed of appropriate use of medications, inhalers, and oxygen therapy.    Expected Outcomes Short Term: Achieves understanding of medications use. Understands that oxygen is a medication prescribed by physician. Demonstrates appropriate use of inhaler and oxygen therapy.;Long Term: Maintain appropriate use of medications, inhalers, and oxygen therapy.    Intervention Provide education on lifestyle modifcations including regular physical activity/exercise, weight management, moderate sodium restriction and increased consumption of fresh fruit, vegetables, and low fat  dairy, alcohol moderation, and smoking cessation.;Monitor prescription use compliance.    Expected Outcomes Short Term: Continued assessment and intervention until BP is < 140/46mm HG in hypertensive participants. < 130/79mm HG in hypertensive participants with diabetes, heart failure or chronic kidney disease.;Long Term: Maintenance of blood pressure at goal levels.             Education:Diabetes - Individual verbal and written instruction to review signs/symptoms of diabetes, desired ranges of glucose level fasting, after meals and with exercise. Acknowledge that pre and post exercise glucose checks will be done for 3 sessions at entry of program.   Know Your Numbers and Heart Failure: - Group verbal and visual instruction to discuss disease risk factors for cardiac and pulmonary disease and treatment options.  Reviews associated critical values for Overweight/Obesity, Hypertension, Cholesterol, and Diabetes.  Discusses basics of heart failure: signs/symptoms and treatments.  Introduces Heart Failure Zone chart for action plan for heart failure.   Written material given at graduation.   Core Components/Risk Factors/Patient Goals Review:    Core Components/Risk Factors/Patient Goals at Discharge (Final Review):    ITP Comments:  ITP Comments     Row Name 05/18/21 1049 05/24/21 1407         ITP Comments Virtual orientation call completed today. he has an appointment on Date: 05/24/2021  for EP eval and gym Orientation.  Documentation of diagnosis can be found in [facility] Date: 08/02/2022[name] has recently quit tobacco use within the last 6 months. Intervention for relapse prevention was provided at the initial medical review. He was encouraged to continue to with tobacco cessation and was provided information on relapse prevention. Patient received information about combination therapy, tobacco cessation classes, quit line, and quit smoking apps in case of a relapse. Patient demonstrated understanding of this material.Staff will continue to provide encouragement and follow up with the patient throughout the program. Completed and gym orientation. Initial ITP created and sent for review to Dr. Vida Rigger, Medical Director.               Comments: Initial ITP

## 2021-05-29 ENCOUNTER — Other Ambulatory Visit: Payer: Self-pay

## 2021-05-29 DIAGNOSIS — J449 Chronic obstructive pulmonary disease, unspecified: Secondary | ICD-10-CM

## 2021-05-29 NOTE — Progress Notes (Signed)
Daily Session Note  Patient Details  Name: Isaac Harrison MRN: [id number] Date of Birth: 1956-07-30 Referring Provider:   Flowsheet Row Pulmonary Rehab from 05/24/2021 in [facility] Cardiac and Pulmonary Rehab  Referring Provider [name] [facility])       Encounter Date: 05/29/2021  Check In:  Session Check In - 05/29/21 0924       Check-In   Supervising physician immediately available to respond to emergencies See telemetry face sheet for immediately available ER MD    Location [facility]-Cardiac & Pulmonary Rehab    Staff Present [name], MPA, RN;Jessica Bogota, MA, RCEP, CCRP, CCET;Amanda Sommer, BA, ACSM CEP, Exercise Physiologist    Virtual Visit No    Medication changes reported     No    Fall or balance concerns reported    No    Tobacco Cessation --   quit smoking year ago   Warm-up and Cool-down Performed on first and last piece of equipment    Resistance Training Performed Yes    VAD Patient? No    PAD/SET Patient? No      Pain Assessment   Currently in Pain? No/denies                Social History   Tobacco Use  Smoking Status Former   Packs/day: 1.50   Years: 35.00   Pack years: 52.50   Types: Cigarettes   Quit date: 03/09/2021   Years since quitting: 0.2  Smokeless Tobacco Never  Tobacco Comments   Quit   not smoking any at all    Goals Met:  Independence with exercise equipment Exercise tolerated well No report of concerns or symptoms today Strength training completed today  Goals Unmet:  Not Applicable  Comments: First full day of exercise!  Patient was oriented to gym and equipment including functions, settings, policies, and procedures.  Patient's individual exercise prescription and treatment plan were reviewed.  All starting workloads were established based on the results of the 6 minute walk test done at initial orientation visit.  The plan for exercise progression was also introduced and progression will be customized based on  patient's performance and goals.    Dr. Emily Filbert is Medical Director for [facility].  Dr. Ottie Glazier is Medical Director for [facility] Pulmonary Rehabilitation.

## 2021-05-31 ENCOUNTER — Other Ambulatory Visit: Payer: Self-pay

## 2021-05-31 DIAGNOSIS — J449 Chronic obstructive pulmonary disease, unspecified: Secondary | ICD-10-CM | POA: Diagnosis not present

## 2021-05-31 NOTE — Progress Notes (Signed)
Daily Session Note  Patient Details  Name: Isaac Harrison MRN: [id number] Date of Birth: [birth year] Referring Provider:   Flowsheet Row Pulmonary Rehab from 05/24/2021 in [facility] Cardiac and Pulmonary Rehab  Referring Provider [name] [facility])       Encounter Date: 05/31/2021  Check In:  Session Check In - 05/31/21 0913       Check-In   Supervising physician immediately available to respond to emergencies See telemetry face sheet for immediately available ER MD    Location [facility]-Cardiac & Pulmonary Rehab    Staff Present [name], MPA, Elveria Rising, BA, ACSM CEP, Exercise Physiologist;Silver Achey Amedeo Plenty, BS, ACSM CEP, Exercise Physiologist    Virtual Visit No    Medication changes reported     No    Fall or balance concerns reported    No    Warm-up and Cool-down Performed on first and last piece of equipment    Resistance Training Performed Yes    VAD Patient? No    PAD/SET Patient? No      Pain Assessment   Currently in Pain? No/denies                Social History   Tobacco Use  Smoking Status Former   Packs/day: 1.50   Years: 35.00   Pack years: 52.50   Types: Cigarettes   Quit date: 03/09/2021   Years since quitting: 0.2  Smokeless Tobacco Never  Tobacco Comments   Quit   not smoking any at all    Goals Met:  Independence with exercise equipment Exercise tolerated well No report of concerns or symptoms today Strength training completed today  Goals Unmet:  Not Applicable  Comments: Pt able to follow exercise prescription today without complaint.  Will continue to monitor for progression.    Dr. Emily Filbert is Medical Director for [facility].  Dr. Ottie Glazier is Medical Director for [facility] Pulmonary Rehabilitation.

## 2021-06-05 ENCOUNTER — Other Ambulatory Visit: Payer: Self-pay

## 2021-06-05 DIAGNOSIS — J449 Chronic obstructive pulmonary disease, unspecified: Secondary | ICD-10-CM

## 2021-06-05 NOTE — Progress Notes (Signed)
Daily Session Note  Patient Details  Name: Isaac Harrison MRN: [id number] Date of Birth: 1956/08/11 Referring Provider:   Flowsheet Row Pulmonary Rehab from 05/24/2021 in [facility] Cardiac and Pulmonary Rehab  Referring Provider [name] [facility])       Encounter Date: 06/05/2021  Check In:  Session Check In - 06/05/21 0918       Check-In   Supervising physician immediately available to respond to emergencies See telemetry face sheet for immediately available ER MD    Location [facility]-Cardiac & Pulmonary Rehab    Staff Present [name], MPA, RN;Amanda Sommer, BA, ACSM CEP, Exercise Physiologist;Jessica Luan Pulling, MA, RCEP, CCRP, CCET    Virtual Visit No    Medication changes reported     No    Fall or balance concerns reported    No    Warm-up and Cool-down Performed on first and last piece of equipment    Resistance Training Performed Yes    VAD Patient? No    PAD/SET Patient? No      Pain Assessment   Currently in Pain? No/denies                Social History   Tobacco Use  Smoking Status Former   Packs/day: 1.50   Years: 35.00   Pack years: 52.50   Types: Cigarettes   Quit date: 03/09/2021   Years since quitting: 0.2  Smokeless Tobacco Never  Tobacco Comments   Quit   not smoking any at all    Goals Met:  Independence with exercise equipment Exercise tolerated well No report of concerns or symptoms today Strength training completed today  Goals Unmet:  Not Applicable  Comments: Pt able to follow exercise prescription today without complaint.  Will continue to monitor for progression.    Dr. Emily Filbert is Medical Director for [place].  Dr. Ottie Glazier is Medical Director for [facility] Pulmonary Rehabilitation.

## 2021-06-07 ENCOUNTER — Other Ambulatory Visit: Payer: Self-pay

## 2021-06-07 ENCOUNTER — Encounter: Payer: No Typology Code available for payment source | Admitting: *Deleted

## 2021-06-07 DIAGNOSIS — J449 Chronic obstructive pulmonary disease, unspecified: Secondary | ICD-10-CM

## 2021-06-07 NOTE — Progress Notes (Signed)
Daily Session Note  Patient Details  Name: Isaac Harrison MRN: [id number] Date of Birth: Jan 14, 1956 Referring Provider:   Flowsheet Row Pulmonary Rehab from 05/24/2021 in [facility] Cardiac and Pulmonary Rehab  Referring Provider [name] [facility])       Encounter Date: 06/07/2021  Check In:  Session Check In - 06/07/21 0913       Check-In   Supervising physician immediately available to respond to emergencies See telemetry face sheet for immediately available ER MD    Location [facility]-Cardiac & Pulmonary Rehab    Staff Present Nyoka Cowden, RN, BSN, Kela Millin, BA, ACSM CEP, Exercise Physiologist;Kelly Amedeo Plenty, BS, ACSM CEP, Exercise Physiologist    Virtual Visit No    Medication changes reported     No    Fall or balance concerns reported    No    Warm-up and Cool-down Performed on first and last piece of equipment    Resistance Training Performed Yes    VAD Patient? No    PAD/SET Patient? No      Pain Assessment   Currently in Pain? No/denies                Social History   Tobacco Use  Smoking Status Former   Packs/day: 1.50   Years: 35.00   Pack years: 52.50   Types: Cigarettes   Quit date: 03/09/2021   Years since quitting: 0.2  Smokeless Tobacco Never  Tobacco Comments   Quit   not smoking any at all    Goals Met:  Proper associated with RPD/PD & O2 Sat Independence with exercise equipment Using PLB without cueing & demonstrates good technique Exercise tolerated well No report of concerns or symptoms today Strength training completed today  Goals Unmet:  Not Applicable  Comments: Pt able to follow exercise prescription today without complaint.  Will continue to monitor for progression.    Dr. Emily Filbert is Medical Director for [facility].  Dr. Ottie Glazier is Medical Director for [facility] Pulmonary Rehabilitation.

## 2021-06-12 ENCOUNTER — Encounter: Payer: No Typology Code available for payment source | Attending: Pulmonary Disease

## 2021-06-12 ENCOUNTER — Other Ambulatory Visit: Payer: Self-pay

## 2021-06-12 DIAGNOSIS — J449 Chronic obstructive pulmonary disease, unspecified: Secondary | ICD-10-CM | POA: Diagnosis present

## 2021-06-12 NOTE — Progress Notes (Signed)
Daily Session Note  Patient Details  Name: Isaac Harrison MRN: [id number] Date of Birth: 15-Jun-1956 Referring Provider:   Flowsheet Row Pulmonary Rehab from 05/24/2021 in [facility] Cardiac and Pulmonary Rehab  Referring Provider Isaac Harrison [facility])       Encounter Date: 06/12/2021  Check In:  Session Check In - 06/12/21 0934       Check-In   Supervising physician immediately available to respond to emergencies See telemetry face sheet for immediately available ER MD    Location [facility]-Cardiac & Pulmonary Rehab    Staff Present Isaac Harrison, MPA, RN;Isaac Harrison, BA, ACSM CEP, Exercise Physiologist;Isaac Luan Pulling, MA, RCEP, CCRP, CCET    Virtual Visit No    Medication changes reported     No    Fall or balance concerns reported    No    Warm-up and Cool-down Performed on first and last piece of equipment    Resistance Training Performed Yes    VAD Patient? No    PAD/SET Patient? No      Pain Assessment   Currently in Pain? No/denies                Social History   Tobacco Use  Smoking Status Former   Packs/day: 1.50   Years: 35.00   Pack years: 52.50   Types: Cigarettes   Quit date: 03/09/2021   Years since quitting: 0.2  Smokeless Tobacco Never  Tobacco Comments   Quit   not smoking any at all    Goals Met:  Independence with exercise equipment Exercise tolerated well No report of concerns or symptoms today Strength training completed today  Goals Unmet:  Not Applicable  Comments: Pt able to follow exercise prescription today without complaint.  Will continue to monitor for progression.    Dr. Emily Harrison is Medical Director for [place].  Dr. Ottie Harrison is Medical Director for [facility] Pulmonary Rehabilitation.

## 2021-06-13 ENCOUNTER — Encounter: Payer: Self-pay | Admitting: *Deleted

## 2021-06-13 DIAGNOSIS — J449 Chronic obstructive pulmonary disease, unspecified: Secondary | ICD-10-CM

## 2021-06-13 NOTE — Progress Notes (Signed)
Pulmonary Individual Treatment Plan  Patient Details  Name: Isaac Harrison MRN: [id number] Date of Birth: 17-Jun-1956 Referring Provider:   Flowsheet Row Pulmonary Rehab from 05/24/2021 in [facility] Cardiac and Pulmonary Rehab  Referring Provider Nena Alexander [facility])       Initial Encounter Date:  Flowsheet Row Pulmonary Rehab from 05/24/2021 in [facility] Cardiac and Pulmonary Rehab  Date 05/24/21       Visit Diagnosis: Chronic obstructive pulmonary disease, unspecified COPD type (HCC)  Patient's Home Medications on Admission:  Current Outpatient Medications:    albuterol (PROVENTIL HFA;VENTOLIN HFA) 108 (90 Base) MCG/ACT inhaler, Inhale 2 puffs into the lungs every 6 (six) hours as needed for wheezing or shortness of breath., Disp: 1 Inhaler, Rfl: 0   albuterol (PROVENTIL) (2.5 MG/3ML) 0.083% nebulizer solution, USE 1 AMPULE BY ORAL INHALATION FOUR TIMES A DAY AS NEEDED FOR BREATHING / SHORTNESS OF BREATH, Disp: , Rfl:    amLODipine (NORVASC) 10 MG tablet, Take by mouth., Disp: , Rfl:    azithromycin (ZITHROMAX Z-PAK) 250 MG tablet, Take 2 tablets (500 mg) on  Day 1,  followed by 1 tablet (250 mg) once daily on Days 2 through 5. (Patient not taking: Reported on 04/07/2018), Disp: 6 each, Rfl: 0   Budeson-Glycopyrrol-Formoterol (BREZTRI AEROSPHERE) 160-9-4.8 MCG/ACT AERO, Inhale 2 Inhalers into the lungs 2 (two) times daily., Disp: , Rfl:    fluticasone (FLONASE) 50 MCG/ACT nasal spray, INSTILL 2 SPRAYS INTO EACH NOSTRIL ONCE EVERY DAY MAXIMUM 2 SPRAYS IN EACH NOSTRIL DAILY. FOR ALLERGIES, Disp: , Rfl:    guaiFENesin (MUCINEX) 600 MG 12 hr tablet, Take 1 tablet by mouth 2 (two) times daily., Disp: , Rfl:    predniSONE (DELTASONE) 20 MG tablet, Take 3 tablets (60 mg total) by mouth daily., Disp: 12 tablet, Rfl: 0   predniSONE (DELTASONE) 20 MG tablet, Take 3 tablets (60 mg total) by mouth daily. (Patient not taking: Reported on 05/18/2021), Disp: 12 tablet, Rfl: 0   Spacer/Aero Chamber Mouthpiece  MISC, 1 Units by Does not apply route every 4 (four) hours as needed (wheezing). (Patient not taking: Reported on 04/07/2018), Disp: 1 each, Rfl: 0  Past Medical History: Past Medical History:  Diagnosis Date   COPD (chronic obstructive pulmonary disease) (HCC)     Tobacco Use: Social History   Tobacco Use  Smoking Status Former   Packs/day: 1.50   Years: 35.00   Pack years: 52.50   Types: Cigarettes   Quit date: 03/09/2021   Years since quitting: 0.2  Smokeless Tobacco Never  Tobacco Comments   Quit   not smoking any at all    Labs: Recent Review Flowsheet Data     Labs for ITP Cardiac and Pulmonary Rehab Latest Ref Rng & Units 04/07/2018   HCO3 20.0 - 28.0 mmol/L 27.1   ACIDBASEDEF 0.0 - 2.0 mmol/L 0.6        Pulmonary Assessment Scores:  Pulmonary Assessment Scores     Row Name 05/24/21 1154         ADL UCSD   ADL Phase Entry     SOB Score total 52     Rest 1     Walk 3     Stairs 4     Bath 3     Dress 3     Shop 3           CAT Score   CAT Score 29           mMRC Score   mMRC  Score 2.5              UCSD: Self-administered rating of dyspnea associated with activities of daily living (ADLs) 6-point scale (0 = "not at all" to 5 = "maximal or unable to do because of breathlessness")  Scoring Scores range from 0 to 120.  Minimally important difference is 5 units  CAT: CAT can identify the health impairment of COPD patients and is better correlated with disease progression.  CAT has a scoring range of zero to 40. The CAT score is classified into four groups of low (less than 10), medium (10 - 20), high (21-30) and very high (31-40) based on the impact level of disease on health status. A CAT score over 10 suggests significant symptoms.  A worsening CAT score could be explained by an exacerbation, poor medication adherence, poor inhaler technique, or progression of COPD or comorbid conditions.  CAT MCID is 2 points  mMRC: mMRC (Modified Medical  Research Council) Dyspnea Scale is used to assess the degree of baseline functional disability in patients of respiratory disease due to dyspnea. No minimal important difference is established. A decrease in score of 1 point or greater is considered a positive change.   Pulmonary Function Assessment:   Exercise Target Goals: Exercise Program Goal: Individual exercise prescription set using results from initial 6 min walk test and THRR while considering  patient's activity barriers and safety.   Exercise Prescription Goal: Initial exercise prescription builds to 30-45 minutes a day of aerobic activity, 2-3 days per week.  Home exercise guidelines will be given to patient during program as part of exercise prescription that the participant will acknowledge.  Education: Aerobic Exercise: - Group verbal and visual presentation on the components of exercise prescription. Introduces F.I.T.T principle from ACSM for exercise prescriptions.  Reviews F.I.T.T. principles of aerobic exercise including progression. Written material given at graduation.   Education: Resistance Exercise: - Group verbal and visual presentation on the components of exercise prescription. Introduces F.I.T.T principle from ACSM for exercise prescriptions  Reviews F.I.T.T. principles of resistance exercise including progression. Written material given at graduation.    Education: Exercise & Equipment Safety: - Individual verbal instruction and demonstration of equipment use and safety with use of the equipment. Flowsheet Row Pulmonary Rehab from 05/31/2021 in [facility] Cardiac and Pulmonary Rehab  Education need identified 05/24/21  Date 05/24/21  Educator KL  Instruction Review Code 1- Verbalizes Understanding       Education: Exercise Physiology & General Exercise Guidelines: - Group verbal and written instruction with models to review the exercise physiology of the cardiovascular system and associated critical values.  Provides general exercise guidelines with specific guidelines to those with heart or lung disease.    Education: Flexibility, Balance, Mind/Body Relaxation: - Group verbal and visual presentation with interactive activity on the components of exercise prescription. Introduces F.I.T.T principle from ACSM for exercise prescriptions. Reviews F.I.T.T. principles of flexibility and balance exercise training including progression. Also discusses the mind body connection.  Reviews various relaxation techniques to help reduce and manage stress (i.e. Deep breathing, progressive muscle relaxation, and visualization). Balance handout provided to take home. Written material given at graduation.   Activity Barriers & Risk Stratification:  Activity Barriers & Cardiac Risk Stratification - 05/24/21 1202       Activity Barriers & Cardiac Risk Stratification   Activity Barriers Shortness of Breath;Deconditioning             6 Minute Walk:  6 Minute Walk  Row Name 05/24/21 1408         6 Minute Walk   Phase Initial     Distance 975 feet     Walk Time 6 minutes     # of Rest Breaks 0     MPH 1.84     METS 3.02     RPE 11     Perceived Dyspnea  2     VO2 Peak 10.57     Symptoms Yes (comment)     Comments SOB     Resting HR 71 bpm     Resting BP 144/74     Resting Oxygen Saturation  95 %     Exercise Oxygen Saturation  during 6 min walk 89 %     Max Ex. HR 100 bpm     Max Ex. BP 164/76     2 Minute Post BP 144/74           Interval HR   1 Minute HR 92     2 Minute HR 93     3 Minute HR 95     4 Minute HR 100     5 Minute HR 97     6 Minute HR 98     2 Minute Post HR 78     Interval Heart Rate? Yes           Interval Oxygen   Interval Oxygen? Yes     Baseline Oxygen Saturation % 95 %     1 Minute Oxygen Saturation % 92 %     1 Minute Liters of Oxygen 0 L  RA     2 Minute Oxygen Saturation % 93 %     2 Minute Liters of Oxygen 0 L     3 Minute Oxygen Saturation % 91 %      3 Minute Liters of Oxygen 0 L     4 Minute Oxygen Saturation % 89 %     4 Minute Liters of Oxygen 0 L     5 Minute Oxygen Saturation % 90 %     5 Minute Liters of Oxygen 0 L     6 Minute Oxygen Saturation % 89 %     6 Minute Liters of Oxygen 0 L     2 Minute Post Oxygen Saturation % 96 %     2 Minute Post Liters of Oxygen 0 L             Oxygen Initial Assessment:  Oxygen Initial Assessment - 05/24/21 1154       Home Oxygen   Home Oxygen Device None    Sleep Oxygen Prescription None    Home Exercise Oxygen Prescription None    Home Resting Oxygen Prescription None      Initial 6 min Walk   Oxygen Used None      Program Oxygen Prescription   Program Oxygen Prescription None      Intervention   Short Term Goals To learn and demonstrate proper pursed lip breathing techniques or other breathing techniques. ;To learn and understand importance of maintaining oxygen saturations>88%;To learn and demonstrate proper use of respiratory medications;To learn and understand importance of monitoring SPO2 with pulse oximeter and demonstrate accurate use of the pulse oximeter.    Long  Term Goals Verbalizes importance of monitoring SPO2 with pulse oximeter and return demonstration;Maintenance of O2 saturations>88%;Exhibits proper breathing techniques, such as pursed lip breathing or other method taught during program session;Compliance with respiratory medication;Demonstrates  proper use of MDI's             Oxygen Re-Evaluation:  Oxygen Re-Evaluation     Row Name 05/29/21 0925             Program Oxygen Prescription   Program Oxygen Prescription None               Home Oxygen   Home Oxygen Device None       Sleep Oxygen Prescription None       Home Exercise Oxygen Prescription None       Home Resting Oxygen Prescription None               Goals/Expected Outcomes   Short Term Goals To learn and demonstrate proper pursed lip breathing techniques or other breathing  techniques. ;To learn and understand importance of maintaining oxygen saturations>88%;To learn and understand importance of monitoring SPO2 with pulse oximeter and demonstrate accurate use of the pulse oximeter.       Long  Term Goals Verbalizes importance of monitoring SPO2 with pulse oximeter and return demonstration;Maintenance of O2 saturations>88%;Exhibits proper breathing techniques, such as pursed lip breathing or other method taught during program session;Compliance with respiratory medication       Comments Reviewed PLB technique with pt.  Talked about how it works and it's importance in maintaining their exercise saturations.       Goals/Expected Outcomes Short: Become more profiecient at using PLB.   Long: Become independent at using PLB.                Oxygen Discharge (Final Oxygen Re-Evaluation):  Oxygen Re-Evaluation - 05/29/21 0925       Program Oxygen Prescription   Program Oxygen Prescription None      Home Oxygen   Home Oxygen Device None    Sleep Oxygen Prescription None    Home Exercise Oxygen Prescription None    Home Resting Oxygen Prescription None      Goals/Expected Outcomes   Short Term Goals To learn and demonstrate proper pursed lip breathing techniques or other breathing techniques. ;To learn and understand importance of maintaining oxygen saturations>88%;To learn and understand importance of monitoring SPO2 with pulse oximeter and demonstrate accurate use of the pulse oximeter.    Long  Term Goals Verbalizes importance of monitoring SPO2 with pulse oximeter and return demonstration;Maintenance of O2 saturations>88%;Exhibits proper breathing techniques, such as pursed lip breathing or other method taught during program session;Compliance with respiratory medication    Comments Reviewed PLB technique with pt.  Talked about how it works and it's importance in maintaining their exercise saturations.    Goals/Expected Outcomes Short: Become more profiecient at  using PLB.   Long: Become independent at using PLB.             Initial Exercise Prescription:  Initial Exercise Prescription - 05/24/21 1400       Date of Initial Exercise RX and Referring Provider   Date 05/24/21    Referring Provider Nena Alexander ([place])      Recumbant Bike   Level 1    RPM 60    Watts 25    Minutes 15    METs 3      REL-XR   Level 1    Speed 50    Minutes 15    METs 3      T5 Nustep   Level 1    SPM 80    Minutes 15    METs 3  Track   Laps 17    Minutes 15    METs 1.92      Prescription Details   Frequency (times per week) 2    Duration Progress to 30 minutes of continuous aerobic without signs/symptoms of physical distress      Intensity   THRR 40-80% of Max Heartrate 103-136    Ratings of Perceived Exertion 11-13    Perceived Dyspnea 0-4      Progression   Progression Continue to progress workloads to maintain intensity without signs/symptoms of physical distress.      Resistance Training   Training Prescription Yes    Weight 3 lb    Reps 10-15             Perform Capillary Blood Glucose checks as needed.  Exercise Prescription Changes:   Exercise Prescription Changes     Row Name 05/24/21 1400 06/05/21 0700           Response to Exercise   Blood Pressure (Admit) 144/74 122/64      Blood Pressure (Exercise) 164/76 140/70      Blood Pressure (Exit) 144/74 112/62      Heart Rate (Admit) 71 bpm 93 bpm      Heart Rate (Exercise) 100 bpm 119 bpm      Heart Rate (Exit) 72 bpm 98 bpm      Oxygen Saturation (Admit) 95 % 95 %      Oxygen Saturation (Exercise) 89 % 93 %      Oxygen Saturation (Exit) 96 % 95 %      Rating of Perceived Exertion (Exercise) 11 13      Perceived Dyspnea (Exercise) 2 3      Symptoms SOB SOB      Comments walk test results second full day of exercise      Duration -- Continue with 30 min of aerobic exercise without signs/symptoms of physical distress.      Intensity -- THRR unchanged              Progression   Progression -- Continue to progress workloads to maintain intensity without signs/symptoms of physical distress.      Average METs -- 2.56             Resistance Training   Training Prescription -- Yes      Weight -- 3 lb      Reps -- 10-15             Interval Training   Interval Training -- No             Recumbant Bike   Level -- 1      Watts -- 15      Minutes -- 15      METs -- 2.59             REL-XR   Level -- 1      Minutes -- 15      METs -- 3.6             T5 Nustep   Level -- 1      Minutes -- 15      METs -- 1.8             Track   Laps -- 23      Minutes -- 15      METs -- 2.25             Oxygen   Maintain Oxygen Saturation --  88% or higher               Exercise Comments:   Exercise Comments     Row Name 05/29/21 [phone]           Exercise Comments First full day of exercise!  Patient was oriented to gym and equipment including functions, settings, policies, and procedures.  Patient's individual exercise prescription and treatment plan were reviewed.  All starting workloads were established based on the results of the 6 minute walk test done at initial orientation visit.  The plan for exercise progression was also introduced and progression will be customized based on patient's performance and goals.                Exercise Goals and Review:   Exercise Goals     Row Name 05/24/21 1410             Exercise Goals   Increase Physical Activity Yes       Intervention Provide advice, education, support and counseling about physical activity/exercise needs.;Develop an individualized exercise prescription for aerobic and resistive training based on initial evaluation findings, risk stratification, comorbidities and participant's personal goals.       Expected Outcomes Short Term: Attend rehab on a regular basis to increase amount of physical activity.;Long Term: Add in home exercise to make exercise part of  routine and to increase amount of physical activity.;Long Term: Exercising regularly at least 3-5 days a week.       Increase Strength and Stamina Yes       Intervention Provide advice, education, support and counseling about physical activity/exercise needs.;Develop an individualized exercise prescription for aerobic and resistive training based on initial evaluation findings, risk stratification, comorbidities and participant's personal goals.       Expected Outcomes Short Term: Increase workloads from initial exercise prescription for resistance, speed, and METs.;Short Term: Perform resistance training exercises routinely during rehab and add in resistance training at home;Long Term: Improve cardiorespiratory fitness, muscular endurance and strength as measured by increased METs and functional capacity ( )       Able to understand and use rate of perceived exertion (RPE) scale Yes       Intervention Provide education and explanation on how to use RPE scale       Expected Outcomes Short Term: Able to use RPE daily in rehab to express subjective intensity level;Long Term:  Able to use RPE to guide intensity level when exercising independently       Able to understand and use Dyspnea scale Yes       Intervention Provide education and explanation on how to use Dyspnea scale       Expected Outcomes Short Term: Able to use Dyspnea scale daily in rehab to express subjective sense of shortness of breath during exertion;Long Term: Able to use Dyspnea scale to guide intensity level when exercising independently       Knowledge and understanding of Target Heart Rate Range (THRR) Yes       Intervention Provide education and explanation of THRR including how the numbers were predicted and where they are located for reference       Expected Outcomes Long Term: Able to use THRR to govern intensity when exercising independently;Short Term: Able to state/look up THRR;Short Term: Able to use daily as guideline for  intensity in rehab       Able to check pulse independently Yes       Intervention Provide education and demonstration on  how to check pulse in carotid and radial arteries.;Review the importance of being able to check your own pulse for safety during independent exercise       Expected Outcomes Short Term: Able to explain why pulse checking is important during independent exercise;Long Term: Able to check pulse independently and accurately       Understanding of Exercise Prescription Yes       Intervention Provide education, explanation, and written materials on patient's individual exercise prescription       Expected Outcomes Short Term: Able to explain program exercise prescription;Long Term: Able to explain home exercise prescription to exercise independently                Exercise Goals Re-Evaluation :  Exercise Goals Re-Evaluation     Row Name 05/29/21 0925 06/05/21 0749           Exercise Goal Re-Evaluation   Exercise Goals Review Increase Physical Activity;Able to understand and use rate of perceived exertion (RPE) scale;Knowledge and understanding of Target Heart Rate Range (THRR);Understanding of Exercise Prescription;Increase Strength and Stamina;Able to understand and use Dyspnea scale;Able to check pulse independently Increase Physical Activity;Increase Strength and Stamina;Understanding of Exercise Prescription      Comments Reviewed RPE and dyspnea scales, THR and program prescription with pt today.  Pt voiced understanding and was given a copy of goals to take home. Isaac Harrison is off to a good start in rehab.  He is already at 23 laps on the track on his third visit.  We will continue to monitor his progress.      Expected Outcomes Short: Use RPE daily to regulate intensity. Long: Follow program prescription in THR. Short: Continue to attend rehab regularly Long: Continue to follow program prescription               Discharge Exercise Prescription (Final Exercise  Prescription Changes):  Exercise Prescription Changes - 06/05/21 0700       Response to Exercise   Blood Pressure (Admit) 122/64    Blood Pressure (Exercise) 140/70    Blood Pressure (Exit) 112/62    Heart Rate (Admit) 93 bpm    Heart Rate (Exercise) 119 bpm    Heart Rate (Exit) 98 bpm    Oxygen Saturation (Admit) 95 %    Oxygen Saturation (Exercise) 93 %    Oxygen Saturation (Exit) 95 %    Rating of Perceived Exertion (Exercise) 13    Perceived Dyspnea (Exercise) 3    Symptoms SOB    Comments second full day of exercise    Duration Continue with 30 min of aerobic exercise without signs/symptoms of physical distress.    Intensity THRR unchanged      Progression   Progression Continue to progress workloads to maintain intensity without signs/symptoms of physical distress.    Average METs 2.56      Resistance Training   Training Prescription Yes    Weight 3 lb    Reps 10-15      Interval Training   Interval Training No      Recumbant Bike   Level 1    Watts 15    Minutes 15    METs 2.59      REL-XR   Level 1    Minutes 15    METs 3.6      T5 Nustep   Level 1    Minutes 15    METs 1.8      Track   Laps 23  Minutes 15    METs 2.25      Oxygen   Maintain Oxygen Saturation 88% or higher             Nutrition:  Target Goals: Understanding of nutrition guidelines, daily intake of sodium 1500mg , cholesterol 200mg , calories 30% from fat and 7% or less from saturated fats, daily to have 5 or more servings of fruits and vegetables.  Education: All About Nutrition: -Group instruction provided by verbal, written material, interactive activities, discussions, models, and posters to present general guidelines for heart healthy nutrition including fat, fiber, MyPlate, the role of sodium in heart healthy nutrition, utilization of the nutrition label, and utilization of this knowledge for meal planning. Follow up email sent as well. Written material given at  graduation. Flowsheet Row Pulmonary Rehab from 05/31/2021 in [facility] Cardiac and Pulmonary Rehab  Date 05/31/21  Educator [facility]  Instruction Review Code 1- Verbalizes Understanding       Biometrics:  Pre Biometrics - 05/24/21 1201       Pre Biometrics   Height 5' 11.5" (1.816 m)    Weight 176 lb 14.4 oz (80.2 kg)    BMI (Calculated) 24.33    Single Leg Stand 30 seconds              Nutrition Therapy Plan and Nutrition Goals:  Nutrition Therapy & Goals - 05/24/21 1208       Intervention Plan   Intervention Prescribe, educate and counsel regarding individualized specific dietary modifications aiming towards targeted core components such as weight, hypertension, lipid management, diabetes, heart failure and other comorbidities.    Expected Outcomes Short Term Goal: Understand basic principles of dietary content, such as calories, fat, sodium, cholesterol and nutrients.;Short Term Goal: A plan has been developed with personal nutrition goals set during dietitian appointment.;Long Term Goal: Adherence to prescribed nutrition plan.             Nutrition Assessments:  MEDIFICTS Score Key: ?70 Need to make dietary changes  40-70 Heart Healthy Diet ? 40 Therapeutic Level Cholesterol Diet  Flowsheet Row Pulmonary Rehab from 05/24/2021 in [facility] Cardiac and Pulmonary Rehab  Picture Your Plate Total Score on Admission 54      Picture Your Plate Scores: <81 Unhealthy dietary pattern with much room for improvement. 41-50 Dietary pattern unlikely to meet recommendations for good health and room for improvement. 51-60 More healthful dietary pattern, with some room for improvement.  >60 Healthy dietary pattern, although there may be some specific behaviors that could be improved.   Nutrition Goals Re-Evaluation:   Nutrition Goals Discharge (Final Nutrition Goals Re-Evaluation):   Psychosocial: Target Goals: Acknowledge presence or absence of significant depression and/or stress,  maximize coping skills, provide positive support system. Participant is able to verbalize types and ability to use techniques and skills needed for reducing stress and depression.   Education: Stress, Anxiety, and Depression - Group verbal and visual presentation to define topics covered.  Reviews how body is impacted by stress, anxiety, and depression.  Also discusses healthy ways to reduce stress and to treat/manage anxiety and depression.  Written material given at graduation.   Education: Sleep Hygiene -Provides group verbal and written instruction about how sleep can affect your health.  Define sleep hygiene, discuss sleep cycles and impact of sleep habits. Review good sleep hygiene tips.    Initial Review & Psychosocial Screening:  Initial Psych Review & Screening - 05/18/21 1025       Initial Review   Current issues  with None Identified      Family Dynamics   Good Support System? Yes   wife, few friends     Barriers   Psychosocial barriers to participate in program There are no identifiable barriers or psychosocial needs.;The patient should benefit from training in stress management and relaxation.      Screening Interventions   Interventions Encouraged to exercise;Provide feedback about the scores to participant;To provide support and resources with identified psychosocial needs    Expected Outcomes Short Term goal: Utilizing psychosocial counselor, staff and physician to assist with identification of specific Stressors or current issues interfering with healing process. Setting desired goal for each stressor or current issue identified.;Long Term Goal: Stressors or current issues are controlled or eliminated.;Short Term goal: Identification and review with participant of any Quality of Life or Depression concerns found by scoring the questionnaire.;Long Term goal: The participant improves quality of Life and PHQ9 Scores as seen by post scores and/or verbalization of changes              Quality of Life Scores:  Scores of 19 and below usually indicate a poorer quality of life in these areas.  A difference of  2-3 points is a clinically meaningful difference.  A difference of 2-3 points in the total score of the Quality of Life Index has been associated with significant improvement in overall quality of life, self-image, physical symptoms, and general health in studies assessing change in quality of life.  PHQ-9: Recent Review Flowsheet Data     Depression screen [facility] 2/9 05/24/2021   Decreased Interest 1   Down, Depressed, Hopeless 0   PHQ - 2 Score 1   Altered sleeping 1   Tired, decreased energy 2   Change in appetite 0   Feeling bad or failure about yourself  0   Trouble concentrating 0   Moving slowly or fidgety/restless 1   Suicidal thoughts 0   PHQ-9 Score 5   Difficult doing work/chores Not difficult at all      Interpretation of Total Score  Total Score Depression Severity:  1-4 = Minimal depression, 5-9 = Mild depression, 10-14 = Moderate depression, 15-19 = Moderately severe depression, 20-27 = Severe depression   Psychosocial Evaluation and Intervention:  Psychosocial Evaluation - 05/18/21 1044       Psychosocial Evaluation & Interventions   Interventions Encouraged to exercise with the program and follow exercise prescription    Comments Isaac Harrison has no barriers to attending the program. He is looking forward to improving his stamina and decreasing his shortness of breath. He lives with his wife and their 2 dogs.   At the moment he can only walk his dogs a short distance. HIs goal is to be able to walk further. HIs wife and some friends are his support. He does not like to share his health issues with others.  He is going to be a subject in a study being done at [facility].  He is hoping this will help with his breathing.    Expected Outcomes STG; Isaac Harrison will be able to attend all his scheduled sessions and gain some more stamina and decrease his shortness  of breath. LTG Isaac Harrison will continue to keep his progress after discharge    Continue Psychosocial Services  Follow up required by staff             Psychosocial Re-Evaluation:   Psychosocial Discharge (Final Psychosocial Re-Evaluation):   Education: Education Goals: Education classes will be provided on a  weekly basis, covering required topics. Participant will state understanding/return demonstration of topics presented.  Learning Barriers/Preferences:  Learning Barriers/Preferences - 05/18/21 1027       Learning Barriers/Preferences   Learning Barriers None    Learning Preferences None             General Pulmonary Education Topics:  Infection Prevention: - Provides verbal and written material to individual with discussion of infection control including proper hand washing and proper equipment cleaning during exercise session. Flowsheet Row Pulmonary Rehab from 05/31/2021 in [facility] Cardiac and Pulmonary Rehab  Education need identified 05/24/21  Date 05/24/21  Educator KL  Instruction Review Code 1- Verbalizes Understanding       Falls Prevention: - Provides verbal and written material to individual with discussion of falls prevention and safety. Flowsheet Row Pulmonary Rehab from 05/31/2021 in [facility] Cardiac and Pulmonary Rehab  Education need identified 05/24/21  Date 05/24/21  Educator KL  Instruction Review Code 1- Verbalizes Understanding       Chronic Lung Disease Review: - Group verbal instruction with posters, models, PowerPoint presentations and videos,  to review new updates, new respiratory medications, new advancements in procedures and treatments. Providing information on websites and "800" numbers for continued self-education. Includes information about supplement oxygen, available portable oxygen systems, continuous and intermittent flow rates, oxygen safety, concentrators, and Medicare reimbursement for oxygen. Explanation of Pulmonary Drugs,  including class, frequency, complications, importance of spacers, rinsing mouth after steroid MDI's, and proper cleaning methods for nebulizers. Review of basic lung anatomy and physiology related to function, structure, and complications of lung disease. Review of risk factors. Discussion about methods for diagnosing sleep apnea and types of masks and machines for OSA. Includes a review of the use of types of environmental controls: home humidity, furnaces, filters, dust mite/pet prevention, HEPA vacuums. Discussion about weather changes, air quality and the benefits of nasal washing. Instruction on Warning signs, infection symptoms, calling MD promptly, preventive modes, and value of vaccinations. Review of effective airway clearance, coughing and/or vibration techniques. Emphasizing that all should Create an Action Plan. Written material given at graduation. Flowsheet Row Pulmonary Rehab from 05/31/2021 in [facility] Cardiac and Pulmonary Rehab  Education need identified 05/24/21       AED/CPR: - Group verbal and written instruction with the use of models to demonstrate the basic use of the AED with the basic ABC's of resuscitation.    Anatomy and Cardiac Procedures: - Group verbal and visual presentation and models provide information about basic cardiac anatomy and function. Reviews the testing methods done to diagnose heart disease and the outcomes of the test results. Describes the treatment choices: Medical Management, Angioplasty, or Coronary Bypass Surgery for treating various heart conditions including Myocardial Infarction, Angina, Valve Disease, and Cardiac Arrhythmias.  Written material given at graduation.   Medication Safety: - Group verbal and visual instruction to review commonly prescribed medications for heart and lung disease. Reviews the medication, class of the drug, and side effects. Includes the steps to properly store meds and maintain the prescription regimen.  Written material  given at graduation.   Other: -Provides group and verbal instruction on various topics (see comments)   Knowledge Questionnaire Score:  Knowledge Questionnaire Score - 05/24/21 1208       Knowledge Questionnaire Score   Pre Score 17/18: Oxygen              Core Components/Risk Factors/Patient Goals at Admission:  Personal Goals and Risk Factors at Admission - 05/24/21 1418  Core Components/Risk Factors/Patient Goals on Admission    Weight Management Yes;Weight Loss   Patient goal; BMI < 25   Intervention Weight Management: Develop a combined nutrition and exercise program designed to reach desired caloric intake, while maintaining appropriate intake of nutrient and fiber, sodium and fats, and appropriate energy expenditure required for the weight goal.;Weight Management: Provide education and appropriate resources to help participant work on and attain dietary goals.;Weight Management/Obesity: Establish reasonable short term and long term weight goals.    Admit Weight 176 lb (79.8 kg)    Goal Weight: Short Term 171 lb (77.6 kg)    Goal Weight: Long Term 165 lb (74.8 kg)    Expected Outcomes Short Term: Continue to assess and modify interventions until short term weight is achieved;Long Term: Adherence to nutrition and physical activity/exercise program aimed toward attainment of established weight goal;Weight Loss: Understanding of general recommendations for a balanced deficit meal plan, which promotes 1-2 lb weight loss per week and includes a negative energy balance of [id number] kcal/d;Understanding recommendations for meals to include 15-35% energy as protein, 25-35% energy from fat, 35-60% energy from carbohydrates, less than 200mg  of dietary cholesterol, 20-35 gm of total fiber daily;Understanding of distribution of calorie intake throughout the day with the consumption of 4-5 meals/snacks    Tobacco Cessation Yes    Number of packs per day Isaac Harrison has recently quit tobacco  use within the last 6 months. Intervention for relapse prevention was provided at the initial medical review. He was encouraged to continue to with tobacco cessation and was provided information on relapse prevention. Patient received information about combination therapy, tobacco cessation classes, quit line, and quit smoking apps in case of a relapse. Patient demonstrated understanding of this material.Staff will continue to provide encouragement and follow up with the patient throughout the program.    Intervention Assist the participant in steps to quit. Provide individualized education and counseling about committing to Tobacco Cessation, relapse prevention, and pharmacological support that can be provided by physician.;Casimiro Needle, assist with locating and accessing local/national Quit Smoking programs, and support quit date choice.    Expected Outcomes Short Term: Will demonstrate readiness to quit, by selecting a quit date.;Short Term: Will quit all tobacco product use, adhering to prevention of relapse plan.;Long Term: Complete abstinence from all tobacco products for at least 12 months from quit date.    Improve shortness of breath with ADL's Yes    Intervention Provide education, individualized exercise plan and daily activity instruction to help decrease symptoms of SOB with activities of daily living.    Expected Outcomes Short Term: Improve cardiorespiratory fitness to achieve a reduction of symptoms when performing ADLs;Long Term: Be able to perform more ADLs without symptoms or delay the onset of symptoms    Increase knowledge of respiratory medications and ability to use respiratory devices properly  Yes    Intervention Provide education and demonstration as needed of appropriate use of medications, inhalers, and oxygen therapy.    Expected Outcomes Short Term: Achieves understanding of medications use. Understands that oxygen is a medication prescribed by physician.  Demonstrates appropriate use of inhaler and oxygen therapy.;Long Term: Maintain appropriate use of medications, inhalers, and oxygen therapy.    Intervention Provide education on lifestyle modifcations including regular physical activity/exercise, weight management, moderate sodium restriction and increased consumption of fresh fruit, vegetables, and low fat dairy, alcohol moderation, and smoking cessation.;Monitor prescription use compliance.    Expected Outcomes Short Term: Continued assessment and intervention until BP is <  140/28mm HG in hypertensive participants. < 130/47mm HG in hypertensive participants with diabetes, heart failure or chronic kidney disease.;Long Term: Maintenance of blood pressure at goal levels.             Education:Diabetes - Individual verbal and written instruction to review signs/symptoms of diabetes, desired ranges of glucose level fasting, after meals and with exercise. Acknowledge that pre and post exercise glucose checks will be done for 3 sessions at entry of program.   Know Your Numbers and Heart Failure: - Group verbal and visual instruction to discuss disease risk factors for cardiac and pulmonary disease and treatment options.  Reviews associated critical values for Overweight/Obesity, Hypertension, Cholesterol, and Diabetes.  Discusses basics of heart failure: signs/symptoms and treatments.  Introduces Heart Failure Zone chart for action plan for heart failure.  Written material given at graduation.   Core Components/Risk Factors/Patient Goals Review:    Core Components/Risk Factors/Patient Goals at Discharge (Final Review):    ITP Comments:  ITP Comments     Row Name 05/18/21 1049 05/24/21 1407 05/29/21 0925 06/13/21 1411     ITP Comments Virtual orientation call completed today. he has an appointment on Date: 05/24/2021  for EP eval and gym Orientation.  Documentation of diagnosis [facility] in [facility] Date: 08/02/2022[name] has recently quit  tobacco use within the last 6 months. Intervention for relapse prevention was provided at the initial medical review. He was encouraged to continue to with tobacco cessation and was provided information on relapse prevention. Patient received information about combination therapy, tobacco cessation classes, quit line, and quit smoking apps in case of a relapse. Patient demonstrated understanding of this material.Staff will continue to provide encouragement and follow up with the patient throughout the program. Completed and gym orientation. Initial ITP created and sent for review to Dr. Vida Rigger, Medical Director. First full day of exercise!  Patient was oriented to gym and equipment including functions, settings, policies, and procedures.  Patient's individual exercise prescription and treatment plan were reviewed.  All starting workloads were established based on the results of the 6 minute walk test done at initial orientation visit.  The plan for exercise progression was also introduced and progression will be customized based on patient's performance and goals. 30 day review completed. ITP sent to Dr. Jinny Sanders, Medical Director of  Pulmonary Rehab. Continue with ITP unless changes are made by physician.             Comments: 30 day review

## 2021-06-14 ENCOUNTER — Other Ambulatory Visit: Payer: Self-pay

## 2021-06-14 DIAGNOSIS — J449 Chronic obstructive pulmonary disease, unspecified: Secondary | ICD-10-CM | POA: Diagnosis not present

## 2021-06-14 NOTE — Progress Notes (Signed)
Daily Session Note  Patient Details  Name: Isaac Harrison MRN: [id number] Date of Birth: 08/11/1956 Referring Provider:   Flowsheet Row Pulmonary Rehab from 05/24/2021 in [facility] Cardiac and Pulmonary Rehab  Referring Provider [name] [facility])       Encounter Date: 06/14/2021  Check In:  Session Check In - 06/14/21 0917       Check-In   Supervising physician immediately available to respond to emergencies See telemetry face sheet for immediately available ER MD    Location [facility]-Cardiac & Pulmonary Rehab    Staff Present [name], MPA, RN;Jessica Luan Pulling, MA, RCEP, CCRP, CCET;Amanda Sommer, BA, ACSM CEP, Exercise Physiologist    Virtual Visit No    Medication changes reported     No    Fall or balance concerns reported    No    Warm-up and Cool-down Performed on first and last piece of equipment    Resistance Training Performed Yes    VAD Patient? No    PAD/SET Patient? No      Pain Assessment   Currently in Pain? No/denies                Social History   Tobacco Use  Smoking Status Former   Packs/day: 1.50   Years: 35.00   Pack years: 52.50   Types: Cigarettes   Quit date: 03/09/2021   Years since quitting: 0.2  Smokeless Tobacco Never  Tobacco Comments   Quit   not smoking any at all    Goals Met:  Independence with exercise equipment Exercise tolerated well No report of concerns or symptoms today Strength training completed today  Goals Unmet:  Not Applicable  Comments: Pt able to follow exercise prescription today without complaint.  Will continue to monitor for progression.    Dr. Emily Filbert is Medical Director for [place].  Dr. Ottie Glazier is Medical Director for [facility] Pulmonary Rehabilitation.

## 2021-06-19 DIAGNOSIS — J449 Chronic obstructive pulmonary disease, unspecified: Secondary | ICD-10-CM

## 2021-06-19 NOTE — Progress Notes (Signed)
Daily Session Note  Patient Details  Name: Isaac Harrison MRN: [id number] Date of Birth: 1956/07/14 Referring Provider:   Flowsheet Row Pulmonary Rehab from 05/24/2021 in [facility] Cardiac and Pulmonary Rehab  Referring Provider [name] [facility])       Encounter Date: 06/19/2021  Check In:  Session Check In - 06/19/21 0913       Check-In   Supervising physician immediately available to respond to emergencies See telemetry face sheet for immediately available ER MD    Location [facility]-Cardiac & Pulmonary Rehab    Staff Present [name], MPA, RN;Jessica Luan Pulling, MA, RCEP, CCRP, CCET;Amanda Sommer, BA, ACSM CEP, Exercise Physiologist    Virtual Visit No    Medication changes reported     No    Fall or balance concerns reported    No    Warm-up and Cool-down Performed on first and last piece of equipment    Resistance Training Performed Yes    VAD Patient? No    PAD/SET Patient? No      Pain Assessment   Currently in Pain? No/denies                Social History   Tobacco Use  Smoking Status Former   Packs/day: 1.50   Years: 35.00   Pack years: 52.50   Types: Cigarettes   Quit date: 03/09/2021   Years since quitting: 0.2  Smokeless Tobacco Never  Tobacco Comments   Quit   not smoking any at all    Goals Met:  Independence with exercise equipment Exercise tolerated well No report of concerns or symptoms today Strength training completed today  Goals Unmet:  Not Applicable  Comments: Pt able to follow exercise prescription today without complaint.  Will continue to monitor for progression. Reviewed home exercise with pt today.  Pt plans to walk and consider [place] for exercise.  Reviewed THR, pulse, RPE, sign and symptoms, pulse oximetery and when to call 911 or MD.  Also discussed weather considerations and indoor options.  Pt voiced understanding.    Dr. Emily Filbert is Medical Director for [place].  Dr. Ottie Glazier is Medical Director for [facility] Pulmonary Rehabilitation.

## 2021-06-21 ENCOUNTER — Other Ambulatory Visit: Payer: Self-pay

## 2021-06-21 DIAGNOSIS — J449 Chronic obstructive pulmonary disease, unspecified: Secondary | ICD-10-CM | POA: Diagnosis not present

## 2021-06-21 NOTE — Progress Notes (Signed)
Daily Session Note  Patient Details  Name: Isaac Harrison MRN: [id number] Date of Birth: [birth year] Referring Provider:   Flowsheet Row Pulmonary Rehab from 05/24/2021 in [facility] Cardiac and Pulmonary Rehab  Referring Provider [name] [facility])       Encounter Date: 06/21/2021  Check In:  Session Check In - 06/21/21 0926       Check-In   Supervising physician immediately available to respond to emergencies See telemetry face sheet for immediately available ER MD    Location [facility]-Cardiac & Pulmonary Rehab    Staff Present [name], MPA, RN;Amanda Oletta Darter, BA, ACSM CEP, Exercise Physiologist;Melissa Caiola, RDN, LDN    Virtual Visit No    Medication changes reported     No    Fall or balance concerns reported    No    Warm-up and Cool-down Performed on first and last piece of equipment    Resistance Training Performed Yes    VAD Patient? No    PAD/SET Patient? No      Pain Assessment   Currently in Pain? No/denies                Social History   Tobacco Use  Smoking Status Former   Packs/day: 1.50   Years: 35.00   Pack years: 52.50   Types: Cigarettes   Quit date: 03/09/2021   Years since quitting: 0.2  Smokeless Tobacco Never  Tobacco Comments   Quit   not smoking any at all    Goals Met:  Independence with exercise equipment Exercise tolerated well No report of concerns or symptoms today Strength training completed today  Goals Unmet:  Not Applicable  Comments: Pt able to follow exercise prescription today without complaint.  Will continue to monitor for progression.    Dr. Emily Filbert is Medical Director for [place].  Dr. Ottie Glazier is Medical Director for [facility] Pulmonary Rehabilitation.

## 2021-06-26 ENCOUNTER — Other Ambulatory Visit: Payer: Self-pay

## 2021-06-26 DIAGNOSIS — J449 Chronic obstructive pulmonary disease, unspecified: Secondary | ICD-10-CM

## 2021-06-26 NOTE — Progress Notes (Signed)
Daily Session Note  Patient Details  Name: Isaac Harrison MRN: [id number] Date of Birth: 1956-09-02 Referring Provider:   Flowsheet Row Pulmonary Rehab from 05/24/2021 in [facility] Cardiac and Pulmonary Rehab  Referring Provider [name] [facility])       Encounter Date: 06/26/2021  Check In:  Session Check In - 06/26/21 0937       Check-In   Supervising physician immediately available to respond to emergencies See telemetry face sheet for immediately available ER MD    Location [facility]-Cardiac & Pulmonary Rehab    Staff Present [name], MPA, RN;Jessica Luan Pulling, MA, RCEP, CCRP, CCET;Amanda Sommer, BA, ACSM CEP, Exercise Physiologist    Virtual Visit No    Medication changes reported     No    Fall or balance concerns reported    No    Warm-up and Cool-down Performed on first and last piece of equipment    Resistance Training Performed Yes    VAD Patient? No    PAD/SET Patient? No      Pain Assessment   Currently in Pain? No/denies                Social History   Tobacco Use  Smoking Status Former   Packs/day: 1.50   Years: 35.00   Pack years: 52.50   Types: Cigarettes   Quit date: 03/09/2021   Years since quitting: 0.2  Smokeless Tobacco Never  Tobacco Comments   Quit   not smoking any at all    Goals Met:  Independence with exercise equipment Exercise tolerated well No report of concerns or symptoms today Strength training completed today  Goals Unmet:  Not Applicable  Comments: Pt able to follow exercise prescription today without complaint.  Will continue to monitor for progression.    Dr. Emily Filbert is Medical Director for [place].  Dr. Ottie Glazier is Medical Director for [facility] Pulmonary Rehabilitation.

## 2021-06-26 NOTE — Progress Notes (Signed)
Completed initial RD consultation ?

## 2021-06-28 ENCOUNTER — Other Ambulatory Visit: Payer: Self-pay

## 2021-06-28 DIAGNOSIS — J449 Chronic obstructive pulmonary disease, unspecified: Secondary | ICD-10-CM | POA: Diagnosis not present

## 2021-06-28 NOTE — Progress Notes (Signed)
Daily Session Note  Patient Details  Name: Isaac Harrison MRN: [id number] Date of Birth: Feb 05, 1956 Referring Provider:   Flowsheet Row Pulmonary Rehab from 05/24/2021 in [facility] Cardiac and Pulmonary Rehab  Referring Provider [name] [facility])       Encounter Date: 06/28/2021  Check In:  Session Check In - 06/28/21 0958       Check-In   Supervising physician immediately available to respond to emergencies See telemetry face sheet for immediately available ER MD    Location [facility]-Cardiac & Pulmonary Rehab    Staff Present [name], MPA, RN;Jessica [place], MA, RCEP, CCRP, CCET;Amanda Sommer, BA, ACSM CEP, Exercise Physiologist;Idalys Konecny Amedeo Plenty, BS, ACSM CEP, Exercise Physiologist    Virtual Visit No    Medication changes reported     No    Fall or balance concerns reported    No    Warm-up and Cool-down Performed on first and last piece of equipment    Resistance Training Performed Yes    VAD Patient? No    PAD/SET Patient? No      Pain Assessment   Currently in Pain? No/denies                Social History   Tobacco Use  Smoking Status Former   Packs/day: 1.50   Years: 35.00   Pack years: 52.50   Types: Cigarettes   Quit date: 03/09/2021   Years since quitting: 0.3  Smokeless Tobacco Never  Tobacco Comments   Quit   not smoking any at all    Goals Met:  Independence with exercise equipment Exercise tolerated well No report of concerns or symptoms today Strength training completed today  Goals Unmet:  Not Applicable  Comments: Pt able to follow exercise prescription today without complaint.  Will continue to monitor for progression.    Dr. Emily Filbert is Medical Director for [place].  Dr. Ottie Glazier is Medical Director for [facility] Pulmonary Rehabilitation.

## 2021-07-03 ENCOUNTER — Other Ambulatory Visit: Payer: Self-pay

## 2021-07-03 DIAGNOSIS — J449 Chronic obstructive pulmonary disease, unspecified: Secondary | ICD-10-CM

## 2021-07-03 NOTE — Progress Notes (Signed)
Daily Session Note  Patient Details  Name: Isaac Harrison MRN: [id number] Date of Birth: 07-Apr-1956 Referring Provider:   Flowsheet Row Pulmonary Rehab from 05/24/2021 in [facility] Cardiac and Pulmonary Rehab  Referring Provider [name] [facility])       Encounter Date: 07/03/2021  Check In:  Session Check In - 07/03/21 0947       Check-In   Supervising physician immediately available to respond to emergencies See telemetry face sheet for immediately available ER MD    Location [facility]-Cardiac & Pulmonary Rehab    Staff Present [name], MPA, RN;Melissa [place], RDN, Rowe Pavy, BA, ACSM CEP, Exercise Physiologist    Virtual Visit No    Medication changes reported     No    Fall or balance concerns reported    No    Warm-up and Cool-down Performed on first and last piece of equipment    Resistance Training Performed Yes    VAD Patient? No    PAD/SET Patient? No      Pain Assessment   Currently in Pain? No/denies                Social History   Tobacco Use  Smoking Status Former   Packs/day: 1.50   Years: 35.00   Pack years: 52.50   Types: Cigarettes   Quit date: 03/09/2021   Years since quitting: 0.3  Smokeless Tobacco Never  Tobacco Comments   Quit   not smoking any at all    Goals Met:  Independence with exercise equipment Exercise tolerated well No report of concerns or symptoms today Strength training completed today  Goals Unmet:  Not Applicable  Comments: Pt able to follow exercise prescription today without complaint.  Will continue to monitor for progression.    Dr. Emily Filbert is Medical Director for [facility].  Dr. Ottie Glazier is Medical Director for [facility] Pulmonary Rehabilitation.

## 2021-07-05 ENCOUNTER — Other Ambulatory Visit: Payer: Self-pay

## 2021-07-05 DIAGNOSIS — J449 Chronic obstructive pulmonary disease, unspecified: Secondary | ICD-10-CM | POA: Diagnosis not present

## 2021-07-05 NOTE — Progress Notes (Signed)
Daily Session Note  Patient Details  Name: Isaac Harrison MRN: [id number] Date of Birth: February 21, 1956 Referring Provider:   Flowsheet Row Pulmonary Rehab from 05/24/2021 in [facility] Cardiac and Pulmonary Rehab  Referring Provider [name] [facility])       Encounter Date: 07/05/2021  Check In:  Session Check In - 07/05/21 0915       Check-In   Supervising physician immediately available to respond to emergencies See telemetry face sheet for immediately available ER MD    Location [facility]-Cardiac & Pulmonary Rehab    Staff Present [name], MPA, RN;Joseph Tessie Fass, RCP,RRT,BSRT;Dimple Bastyr Amedeo Plenty, BS, ACSM CEP, Exercise Physiologist    Virtual Visit No    Medication changes reported     No    Fall or balance concerns reported    No    Warm-up and Cool-down Performed on first and last piece of equipment    Resistance Training Performed Yes    VAD Patient? No    PAD/SET Patient? No      Pain Assessment   Currently in Pain? No/denies                Social History   Tobacco Use  Smoking Status Former   Packs/day: 1.50   Years: 35.00   Pack years: 52.50   Types: Cigarettes   Quit date: 03/09/2021   Years since quitting: 0.3  Smokeless Tobacco Never  Tobacco Comments   Quit   not smoking any at all    Goals Met:  Independence with exercise equipment Exercise tolerated well No report of concerns or symptoms today Strength training completed today  Goals Unmet:  Not Applicable  Comments: Pt able to follow exercise prescription today without complaint.  Will continue to monitor for progression.    Dr. Emily Filbert is Medical Director for [place].  Dr. Ottie Glazier is Medical Director for [facility] Pulmonary Rehabilitation.

## 2021-07-10 ENCOUNTER — Encounter: Payer: No Typology Code available for payment source | Attending: Pulmonary Disease

## 2021-07-10 ENCOUNTER — Other Ambulatory Visit: Payer: Self-pay

## 2021-07-10 DIAGNOSIS — J449 Chronic obstructive pulmonary disease, unspecified: Secondary | ICD-10-CM | POA: Diagnosis not present

## 2021-07-10 NOTE — Progress Notes (Signed)
Daily Session Note  Patient Details  Name: Isaac Harrison MRN: [id number] Date of Birth: [birth year] Referring Provider:   Flowsheet Row Pulmonary Rehab from 05/24/2021 in [facility] Cardiac and Pulmonary Rehab  Referring Provider [name] [facility])       Encounter Date: 07/10/2021  Check In:  Session Check In - 07/10/21 1848       Check-In   Supervising physician immediately available to respond to emergencies See telemetry face sheet for immediately available ER MD    Location [facility]-Cardiac & Pulmonary Rehab    Staff Present [name], MPA, RN;Jessica Luan Pulling, MA, RCEP, CCRP, CCET;Amanda Sommer, BA, ACSM CEP, Exercise Physiologist    Virtual Visit No    Medication changes reported     No    Fall or balance concerns reported    No    Warm-up and Cool-down Performed on first and last piece of equipment    Resistance Training Performed Yes    VAD Patient? No    PAD/SET Patient? No      Pain Assessment   Currently in Pain? No/denies                Social History   Tobacco Use  Smoking Status Former   Packs/day: 1.50   Years: 35.00   Pack years: 52.50   Types: Cigarettes   Quit date: 03/09/2021   Years since quitting: 0.3  Smokeless Tobacco Never  Tobacco Comments   Quit   not smoking any at all    Goals Met:  Independence with exercise equipment Exercise tolerated well No report of concerns or symptoms today Strength training completed today  Goals Unmet:  Not Applicable  Comments: Pt able to follow exercise prescription today without complaint.  Will continue to monitor for progression.    Dr. Emily Filbert is Medical Director for [place].  Dr. Ottie Glazier is Medical Director for [facility] Pulmonary Rehabilitation.

## 2021-07-11 ENCOUNTER — Encounter: Payer: Self-pay | Admitting: *Deleted

## 2021-07-11 DIAGNOSIS — J449 Chronic obstructive pulmonary disease, unspecified: Secondary | ICD-10-CM

## 2021-07-11 NOTE — Progress Notes (Signed)
Pulmonary Individual Treatment Plan  Patient Details  Name: Isaac Harrison MRN: [id number] Date of Birth: [birth year] Referring Provider:   Flowsheet Row Pulmonary Rehab from 05/24/2021 in [facility] Cardiac and Pulmonary Rehab  Referring Provider [name] [facility])       Initial Encounter Date:  Flowsheet Row Pulmonary Rehab from 05/24/2021 in [facility] Cardiac and Pulmonary Rehab  Date 05/24/21       Visit Diagnosis: Chronic obstructive pulmonary disease, unspecified COPD type ([place])  Patient's Home Medications on Admission:  Current Outpatient Medications:    albuterol (PROVENTIL HFA;VENTOLIN HFA) 108 (90 Base) MCG/ACT inhaler, Inhale 2 puffs into the lungs every 6 (six) hours as needed for wheezing or shortness of breath., Disp: 1 Inhaler, Rfl: 0   albuterol (PROVENTIL) (2.5 MG/3ML) 0.083% nebulizer solution, USE 1 AMPULE BY ORAL INHALATION FOUR TIMES A DAY AS NEEDED FOR BREATHING / SHORTNESS OF BREATH, Disp: , Rfl:    amLODipine (NORVASC) 10 MG tablet, Take by mouth., Disp: , Rfl:    azithromycin (ZITHROMAX Z-PAK) 250 MG tablet, Take 2 tablets (500 mg) on  Day 1,  followed by 1 tablet (250 mg) once daily on Days 2 through 5. (Patient not taking: Reported on 04/07/2018), Disp: 6 each, Rfl: 0   Budeson-Glycopyrrol-Formoterol (BREZTRI AEROSPHERE) 160-9-4.8 MCG/ACT AERO, Inhale 2 Inhalers into the lungs 2 (two) times daily., Disp: , Rfl:    fluticasone (FLONASE) 50 MCG/ACT nasal spray, INSTILL 2 SPRAYS INTO EACH NOSTRIL ONCE EVERY DAY MAXIMUM 2 SPRAYS IN EACH NOSTRIL DAILY. FOR ALLERGIES, Disp: , Rfl:    guaiFENesin (MUCINEX) 600 MG 12 hr tablet, Take 1 tablet by mouth 2 (two) times daily., Disp: , Rfl:    predniSONE (DELTASONE) 20 MG tablet, Take 3 tablets (60 mg total) by mouth daily., Disp: 12 tablet, Rfl: 0   predniSONE (DELTASONE) 20 MG tablet, Take 3 tablets (60 mg total) by mouth daily. (Patient not taking: Reported on 05/18/2021), Disp: 12 tablet, Rfl: 0   Spacer/Aero Chamber Mouthpiece  MISC, 1 Units by Does not apply route every 4 (four) hours as needed (wheezing). (Patient not taking: Reported on 04/07/2018), Disp: 1 each, Rfl: 0  Past Medical History: Past Medical History:  Diagnosis Date   COPD (chronic obstructive pulmonary disease) ([place])     Tobacco Use: Social History   Tobacco Use  Smoking Status Former   Packs/day: 1.50   Years: 35.00   Pack years: 52.50   Types: Cigarettes   Quit date: 03/09/2021   Years since quitting: 0.3  Smokeless Tobacco Never  Tobacco Comments   Quit   not smoking any at all    Labs: Recent Review Flowsheet Data     Labs for ITP Cardiac and Pulmonary Rehab Latest Ref Rng & Units 04/07/2018   HCO3 20.0 - 28.0 mmol/L 27.1   ACIDBASEDEF 0.0 - 2.0 mmol/L 0.6        Pulmonary Assessment Scores:  Pulmonary Assessment Scores     Row Name 05/24/21 1154         ADL UCSD   ADL Phase Entry     SOB Score total 52     Rest 1     Walk 3     Stairs 4     Bath 3     Dress 3     Shop 3       CAT Score   CAT Score 29       mMRC Score   mMRC Score 2.5  UCSD: Self-administered rating of dyspnea associated with activities of daily living (ADLs) 6-point scale (0 = "not at all" to 5 = "maximal or unable to do because of breathlessness")  Scoring Scores range from 0 to 120.  Minimally important difference is 5 units  CAT: CAT can identify the health impairment of COPD patients and is better correlated with disease progression.  CAT has a scoring range of zero to 40. The CAT score is classified into four groups of low (less than 10), medium (10 - 20), high (21-30) and very high (31-40) based on the impact level of disease on health status. A CAT score over 10 suggests significant symptoms.  A worsening CAT score could be explained by an exacerbation, poor medication adherence, poor inhaler technique, or progression of COPD or comorbid conditions.  CAT MCID is 2 points  mMRC: mMRC (Modified Medical Research  Council) Dyspnea Scale is used to assess the degree of baseline functional disability in patients of respiratory disease due to dyspnea. No minimal important difference is established. A decrease in score of 1 point or greater is considered a positive change.   Pulmonary Function Assessment:   Exercise Target Goals: Exercise Program Goal: Individual exercise prescription set using results from initial 6 min walk test and THRR while considering  patient's activity barriers and safety.   Exercise Prescription Goal: Initial exercise prescription builds to 30-45 minutes a day of aerobic activity, 2-3 days per week.  Home exercise guidelines will be given to patient during program as part of exercise prescription that the participant will acknowledge.  Education: Aerobic Exercise: - Group verbal and visual presentation on the components of exercise prescription. Introduces F.I.T.T principle from ACSM for exercise prescriptions.  Reviews F.I.T.T. principles of aerobic exercise including progression. Written material given at graduation. Flowsheet Row Pulmonary Rehab from 07/05/2021 in [facility] Cardiac and Pulmonary Rehab  Date 07/05/21  Educator [facility]  Instruction Review Code 1- Verbalizes Understanding       Education: Resistance Exercise: - Group verbal and visual presentation on the components of exercise prescription. Introduces F.I.T.T principle from ACSM for exercise prescriptions  Reviews F.I.T.T. principles of resistance exercise including progression. Written material given at graduation.    Education: Exercise & Equipment Safety: - Individual verbal instruction and demonstration of equipment use and safety with use of the equipment. Flowsheet Row Pulmonary Rehab from 07/05/2021 in [facility] Cardiac and Pulmonary Rehab  Education need identified 05/24/21  Date 05/24/21  Educator [place]  Instruction Review Code 1- Verbalizes Understanding       Education: Exercise Physiology & General  Exercise Guidelines: - Group verbal and written instruction with models to review the exercise physiology of the cardiovascular system and associated critical values. Provides general exercise guidelines with specific guidelines to those with heart or lung disease.  Flowsheet Row Pulmonary Rehab from 07/05/2021 in [facility] Cardiac and Pulmonary Rehab  Date 06/28/21  Educator [facility]  Instruction Review Code 1- Verbalizes Understanding       Education: Flexibility, Balance, Mind/Body Relaxation: - Group verbal and visual presentation with interactive activity on the components of exercise prescription. Introduces F.I.T.T principle from ACSM for exercise prescriptions. Reviews F.I.T.T. principles of flexibility and balance exercise training including progression. Also discusses the mind body connection.  Reviews various relaxation techniques to help reduce and manage stress (i.e. Deep breathing, progressive muscle relaxation, and visualization). Balance handout provided to take home. Written material given at graduation.   Activity Barriers & Risk Stratification:  Activity Barriers & Cardiac Risk Stratification -  05/24/21 1202       Activity Barriers & Cardiac Risk Stratification   Activity Barriers Shortness of Breath;Deconditioning             6 Minute Walk:  6 Minute Walk     Row Name 05/24/21 1408         6 Minute Walk   Phase Initial     Distance 975 feet     Walk Time 6 minutes     # of Rest Breaks 0     MPH 1.84     METS 3.02     RPE 11     Perceived Dyspnea  2     VO2 Peak 10.57     Symptoms Yes (comment)     Comments SOB     Resting HR 71 bpm     Resting BP 144/74     Resting Oxygen Saturation  95 %     Exercise Oxygen Saturation  during 6 min walk 89 %     Max Ex. HR 100 bpm     Max Ex. BP 164/76     2 Minute Post BP 144/74       Interval HR   1 Minute HR 92     2 Minute HR 93     3 Minute HR 95     4 Minute HR 100     5 Minute HR 97     6 Minute HR 98      2 Minute Post HR 78     Interval Heart Rate? Yes       Interval Oxygen   Interval Oxygen? Yes     Baseline Oxygen Saturation % 95 %     1 Minute Oxygen Saturation % 92 %     1 Minute Liters of Oxygen 0 L  RA     2 Minute Oxygen Saturation % 93 %     2 Minute Liters of Oxygen 0 L     3 Minute Oxygen Saturation % 91 %     3 Minute Liters of Oxygen 0 L     4 Minute Oxygen Saturation % 89 %     4 Minute Liters of Oxygen 0 L     5 Minute Oxygen Saturation % 90 %     5 Minute Liters of Oxygen 0 L     6 Minute Oxygen Saturation % 89 %     6 Minute Liters of Oxygen 0 L     2 Minute Post Oxygen Saturation % 96 %     2 Minute Post Liters of Oxygen 0 L             Oxygen Initial Assessment:  Oxygen Initial Assessment - 05/24/21 1154       Home Oxygen   Home Oxygen Device None    Sleep Oxygen Prescription None    Home Exercise Oxygen Prescription None    Home Resting Oxygen Prescription None      Initial 6 min Walk   Oxygen Used None      Program Oxygen Prescription   Program Oxygen Prescription None      Intervention   Short Term Goals To learn and demonstrate proper pursed lip breathing techniques or other breathing techniques. ;To learn and understand importance of maintaining oxygen saturations>88%;To learn and demonstrate proper use of respiratory medications;To learn and understand importance of monitoring SPO2 with pulse oximeter and demonstrate accurate use of the pulse oximeter.  Long  Term Goals Verbalizes importance of monitoring SPO2 with pulse oximeter and return demonstration;Maintenance of O2 saturations>88%;Exhibits proper breathing techniques, such as pursed lip breathing or other method taught during program session;Compliance with respiratory medication;Demonstrates proper use of MDI's             Oxygen Re-Evaluation:  Oxygen Re-Evaluation     Row Name 05/29/21 0925 06/19/21 1023           Program Oxygen Prescription   Program Oxygen Prescription  None None        Home Oxygen   Home Oxygen Device None None      Sleep Oxygen Prescription None None      Home Exercise Oxygen Prescription None None      Home Resting Oxygen Prescription None None      Compliance with Home Oxygen Use -- Yes        Goals/Expected Outcomes   Short Term Goals To learn and demonstrate proper pursed lip breathing techniques or other breathing techniques. ;To learn and understand importance of maintaining oxygen saturations>88%;To learn and understand importance of monitoring SPO2 with pulse oximeter and demonstrate accurate use of the pulse oximeter. To learn and demonstrate proper pursed lip breathing techniques or other breathing techniques. ;To learn and understand importance of maintaining oxygen saturations>88%;To learn and understand importance of monitoring SPO2 with pulse oximeter and demonstrate accurate use of the pulse oximeter.      Long  Term Goals Verbalizes importance of monitoring SPO2 with pulse oximeter and return demonstration;Maintenance of O2 saturations>88%;Exhibits proper breathing techniques, such as pursed lip breathing or other method taught during program session;Compliance with respiratory medication Verbalizes importance of monitoring SPO2 with pulse oximeter and return demonstration;Maintenance of O2 saturations>88%;Exhibits proper breathing techniques, such as pursed lip breathing or other method taught during program session;Compliance with respiratory medication      Comments Reviewed PLB technique with pt.  Talked about how it works and it's importance in maintaining their exercise saturations. Isaac Harrison uses PLB as needed and takes medication as directed.      Goals/Expected Outcomes Short: Become more profiecient at using PLB.   Long: Become independent at using PLB. Short: continue to use PLB Long: be able to do ADLS without shortness of breath               Oxygen Discharge (Final Oxygen Re-Evaluation):  Oxygen Re-Evaluation -  06/19/21 1023       Program Oxygen Prescription   Program Oxygen Prescription None      Home Oxygen   Home Oxygen Device None    Sleep Oxygen Prescription None    Home Exercise Oxygen Prescription None    Home Resting Oxygen Prescription None    Compliance with Home Oxygen Use Yes      Goals/Expected Outcomes   Short Term Goals To learn and demonstrate proper pursed lip breathing techniques or other breathing techniques. ;To learn and understand importance of maintaining oxygen saturations>88%;To learn and understand importance of monitoring SPO2 with pulse oximeter and demonstrate accurate use of the pulse oximeter.    Long  Term Goals Verbalizes importance of monitoring SPO2 with pulse oximeter and return demonstration;Maintenance of O2 saturations>88%;Exhibits proper breathing techniques, such as pursed lip breathing or other method taught during program session;Compliance with respiratory medication    Comments Isaac Harrison uses PLB as needed and takes medication as directed.    Goals/Expected Outcomes Short: continue to use PLB Long: be able to do ADLS without shortness of breath  Initial Exercise Prescription:  Initial Exercise Prescription - 05/24/21 1400       Date of Initial Exercise RX and Referring Provider   Date 05/24/21    Referring Provider [name] ([place])      Recumbant Bike   Level 1    RPM 60    Watts 25    Minutes 15    METs 3      REL-XR   Level 1    Speed 50    Minutes 15    METs 3      T5 Nustep   Level 1    SPM 80    Minutes 15    METs 3      Track   Laps 17    Minutes 15    METs 1.92      Prescription Details   Frequency (times per week) 2    Duration Progress to 30 minutes of continuous aerobic without signs/symptoms of physical distress      Intensity   THRR 40-80% of Max Heartrate 103-136    Ratings of Perceived Exertion 11-13    Perceived Dyspnea 0-4      Progression   Progression Continue to progress workloads to  maintain intensity without signs/symptoms of physical distress.      Resistance Training   Training Prescription Yes    Weight 3 lb    Reps 10-15             Perform Capillary Blood Glucose checks as needed.  Exercise Prescription Changes:   Exercise Prescription Changes     Row Name 05/24/21 1400 06/05/21 0700 06/18/21 1300 07/02/21 1000       Response to Exercise   Blood Pressure (Admit) 144/74 122/64 128/64 120/60    Blood Pressure (Exercise) 164/76 140/70 158/64 134/70    Blood Pressure (Exit) 144/74 112/62 122/70 112/62    Heart Rate (Admit) 71 bpm 93 bpm 89 bpm 84 bpm    Heart Rate (Exercise) 100 bpm 119 bpm 104 bpm 103 bpm    Heart Rate (Exit) 72 bpm 98 bpm 92 bpm 82 bpm    Oxygen Saturation (Admit) 95 % 95 % 92 % 96 %    Oxygen Saturation (Exercise) 89 % 93 % 93 % 93 %    Oxygen Saturation (Exit) 96 % 95 % 95 % 94 %    Rating of Perceived Exertion (Exercise) 11 13 12 12     Perceived Dyspnea (Exercise) 2 3 1 2     Symptoms SOB SOB -- SOB    Comments walk test results second full day of exercise -- --    Duration -- Continue with 30 min of aerobic exercise without signs/symptoms of physical distress. Continue with 30 min of aerobic exercise without signs/symptoms of physical distress. Continue with 30 min of aerobic exercise without signs/symptoms of physical distress.    Intensity -- THRR unchanged THRR unchanged THRR unchanged      Progression   Progression -- Continue to progress workloads to maintain intensity without signs/symptoms of physical distress. Continue to progress workloads to maintain intensity without signs/symptoms of physical distress. Continue to progress workloads to maintain intensity without signs/symptoms of physical distress.    Average METs -- 2.56 3.13 3.06      Resistance Training   Training Prescription -- Yes Yes Yes    Weight -- 3 lb 3 lb 3 lb    Reps -- 10-15 10-15 10-15      Interval Training   Interval  Training -- No No No       Recumbant Bike   Level -- 1 -- --    Watts -- 15 -- --    Minutes -- 15 -- --    METs -- 2.59 -- --      REL-XR   Level -- $Remove'1 3 3    'SqBvFtZ$ Minutes -- $RemoveBe'15 15 15    'vqlfGsvCx$ METs -- 3.6 4 4.4      T5 Nustep   Level -- 1 -- 3    Minutes -- 15 -- 15    METs -- 1.8 -- --      Track   Laps -- $Remov'23 23 25    'ANoVcg$ Minutes -- $RemoveBe'15 15 15    'pgVVfbGtg$ METs -- 2.25 2.25 2.36      Oxygen   Maintain Oxygen Saturation -- 88% or higher 88% or higher 88% or higher             Exercise Comments:   Exercise Comments     Row Name 05/29/21 8638           Exercise Comments First full day of exercise!  Patient was oriented to gym and equipment including functions, settings, policies, and procedures.  Patient's individual exercise prescription and treatment plan were reviewed.  All starting workloads were established based on the results of the 6 minute walk test done at initial orientation visit.  The plan for exercise progression was also introduced and progression will be customized based on patient's performance and goals.                Exercise Goals and Review:   Exercise Goals     Row Name 05/24/21 1410             Exercise Goals   Increase Physical Activity Yes       Intervention Provide advice, education, support and counseling about physical activity/exercise needs.;Develop an individualized exercise prescription for aerobic and resistive training based on initial evaluation findings, risk stratification, comorbidities and participant's personal goals.       Expected Outcomes Short Term: Attend rehab on a regular basis to increase amount of physical activity.;Long Term: Add in home exercise to make exercise part of routine and to increase amount of physical activity.;Long Term: Exercising regularly at least 3-5 days a week.       Increase Strength and Stamina Yes       Intervention Provide advice, education, support and counseling about physical activity/exercise needs.;Develop an individualized exercise  prescription for aerobic and resistive training based on initial evaluation findings, risk stratification, comorbidities and participant's personal goals.       Expected Outcomes Short Term: Increase workloads from initial exercise prescription for resistance, speed, and METs.;Short Term: Perform resistance training exercises routinely during rehab and add in resistance training at home;Long Term: Improve cardiorespiratory fitness, muscular endurance and strength as measured by increased METs and functional capacity (6MWT)       Able to understand and use rate of perceived exertion (RPE) scale Yes       Intervention Provide education and explanation on how to use RPE scale       Expected Outcomes Short Term: Able to use RPE daily in rehab to express subjective intensity level;Long Term:  Able to use RPE to guide intensity level when exercising independently       Able to understand and use Dyspnea scale Yes       Intervention Provide education and explanation on how to  use Dyspnea scale       Expected Outcomes Short Term: Able to use Dyspnea scale daily in rehab to express subjective sense of shortness of breath during exertion;Long Term: Able to use Dyspnea scale to guide intensity level when exercising independently       Knowledge and understanding of Target Heart Rate Range (THRR) Yes       Intervention Provide education and explanation of THRR including how the numbers were predicted and where they are located for reference       Expected Outcomes Long Term: Able to use THRR to govern intensity when exercising independently;Short Term: Able to state/look up THRR;Short Term: Able to use daily as guideline for intensity in rehab       Able to check pulse independently Yes       Intervention Provide education and demonstration on how to check pulse in carotid and radial arteries.;Review the importance of being able to check your own pulse for safety during independent exercise       Expected Outcomes  Short Term: Able to explain why pulse checking is important during independent exercise;Long Term: Able to check pulse independently and accurately       Understanding of Exercise Prescription Yes       Intervention Provide education, explanation, and written materials on patient's individual exercise prescription       Expected Outcomes Short Term: Able to explain program exercise prescription;Long Term: Able to explain home exercise prescription to exercise independently                Exercise Goals Re-Evaluation :  Exercise Goals Re-Evaluation     Row Name 05/29/21 0925 06/05/21 0749 06/18/21 1352 06/19/21 1022 07/02/21 1103     Exercise Goal Re-Evaluation   Exercise Goals Review Increase Physical Activity;Able to understand and use rate of perceived exertion (RPE) scale;Knowledge and understanding of Target Heart Rate Range (THRR);Understanding of Exercise Prescription;Increase Strength and Stamina;Able to understand and use Dyspnea scale;Able to check pulse independently Increase Physical Activity;Increase Strength and Stamina;Understanding of Exercise Prescription Increase Physical Activity;Increase Strength and Stamina Increase Physical Activity;Increase Strength and Stamina;Able to understand and use rate of perceived exertion (RPE) scale;Knowledge and understanding of Target Heart Rate Range (THRR);Able to check pulse independently Increase Physical Activity;Increase Strength and Stamina   Comments Reviewed RPE and dyspnea scales, THR and program prescription with pt today.  Pt voiced understanding and was given a copy of goals to take home. Isaac Harrison is off to a good start in rehab.  He is already at 23 laps on the track on his third visit.  We will continue to monitor his progress. Isaac Harrison is doing well so far.  Staff will encourag etrying 4 lb for strength work. Reviewed home exercise with pt today.  Pt plans to walk and consider [place] for exercise.  Reviewed THR, pulse, RPE, sign and  symptoms, pulse oximetery and when to call 911 or MD.  Also discussed weather considerations and indoor options.  Pt voiced understanding. Isaac Harrison is continuing to do well in rehab. He has increased to level 3 on the T5 Nustep and completed 25 laps on the track which is the most thus far. He should be encouraged to increase to 4 lbs for his handweights. Will continue to monitor   Expected Outcomes Short: Use RPE daily to regulate intensity. Long: Follow program prescription in THR. Short: Continue to attend rehab regularly Long: Continue to follow program prescription Short: try 4 lb Long: build overall stamina  Short: add one day per week in addition to program sessions  Long:exercise 3-5 days per week Short: Continue to increase loads on the track and increase handweights to 4 lbs Long: Continue to increase overall MET level            Discharge Exercise Prescription (Final Exercise Prescription Changes):  Exercise Prescription Changes - 07/02/21 1000       Response to Exercise   Blood Pressure (Admit) 120/60    Blood Pressure (Exercise) 134/70    Blood Pressure (Exit) 112/62    Heart Rate (Admit) 84 bpm    Heart Rate (Exercise) 103 bpm    Heart Rate (Exit) 82 bpm    Oxygen Saturation (Admit) 96 %    Oxygen Saturation (Exercise) 93 %    Oxygen Saturation (Exit) 94 %    Rating of Perceived Exertion (Exercise) 12    Perceived Dyspnea (Exercise) 2    Symptoms SOB    Duration Continue with 30 min of aerobic exercise without signs/symptoms of physical distress.    Intensity THRR unchanged      Progression   Progression Continue to progress workloads to maintain intensity without signs/symptoms of physical distress.    Average METs 3.06      Resistance Training   Training Prescription Yes    Weight 3 lb    Reps 10-15      Interval Training   Interval Training No      REL-XR   Level 3    Minutes 15    METs 4.4      T5 Nustep   Level 3    Minutes 15      Track   Laps 25     Minutes 15    METs 2.36      Oxygen   Maintain Oxygen Saturation 88% or higher             Nutrition:  Target Goals: Understanding of nutrition guidelines, daily intake of sodium '1500mg'$ , cholesterol '200mg'$ , calories 30% from fat and 7% or less from saturated fats, daily to have 5 or more servings of fruits and vegetables.  Education: All About Nutrition: -Group instruction provided by verbal, written material, interactive activities, discussions, models, and posters to present general guidelines for heart healthy nutrition including fat, fiber, MyPlate, the role of sodium in heart healthy nutrition, utilization of the nutrition label, and utilization of this knowledge for meal planning. Follow up email sent as well. Written material given at graduation. Flowsheet Row Pulmonary Rehab from 07/05/2021 in [facility] [facility]  Date 05/31/21  Educator [facility]  Instruction Review Code 1- Verbalizes Understanding       Biometrics:  Pre Biometrics - 05/24/21 1201       Pre Biometrics   Height 5' 11.5" (1.816 m)    Weight 176 lb 14.4 oz (80.2 kg)    BMI (Calculated) 24.33    Single Leg Stand 30 seconds              Nutrition Therapy Plan and Nutrition Goals:  Nutrition Therapy & Goals - 06/26/21 1018       Nutrition Therapy   Diet Heart healthy, low Na, pulmonary MNT    Protein (specify units) 95g    Fiber 30 grams    Whole Grain Foods 3 servings    Saturated Fats 12 max. grams    Fruits and Vegetables 8 servings/day    Sodium 1.5 grams      Personal Nutrition Goals  Nutrition Goal ST: have 1 good protein source at each meal/snack LT: limit Na <1.5g, meet protein and calorie needs consistently    Comments He has surgeries on his lungs in [place] - it is experimental and he may be a part of the placebo group. He feels short of breath after cooking. He has small frequent meals (4-6 meals) and has some shortness of breath. B: biscuit with country ham L: pot pie  D: shrimp or fish 1x/week, beef stew, pork with rice and potatoes and salads and other green vegetables. He gets a good variety of vegetables. He adds spices and not much salt, bacon grease sometimes, olive oil mostly with cooking. He enjoys cheese and eggs as well, he has peanuts as snack with sundrop. Drinks: 1 gallon of milk every two day, sundrop, sometimes rum and coke. Discussed heart healthy eating and pulmonary MNT.      Intervention Plan   Intervention Prescribe, educate and counsel regarding individualized specific dietary modifications aiming towards targeted core components such as weight, hypertension, lipid management, diabetes, heart failure and other comorbidities.    Expected Outcomes Short Term Goal: Understand basic principles of dietary content, such as calories, fat, sodium, cholesterol and nutrients.;Short Term Goal: A plan has been developed with personal nutrition goals set during dietitian appointment.;Long Term Goal: Adherence to prescribed nutrition plan.             Nutrition Assessments:  MEDIFICTS Score Key: ?70 Need to make dietary changes  40-70 Heart Healthy Diet ? 40 Therapeutic Level Cholesterol Diet  Flowsheet Row Pulmonary Rehab from 05/24/2021 in [facility] Cardiac and Pulmonary Rehab  Picture Your Plate Total Score on Admission 54      Picture Your Plate Scores: <09 Unhealthy dietary pattern with much room for improvement. 41-50 Dietary pattern unlikely to meet recommendations for good health and room for improvement. 51-60 More healthful dietary pattern, with some room for improvement.  >60 Healthy dietary pattern, although there may be some specific behaviors that could be improved.   Nutrition Goals Re-Evaluation:   Nutrition Goals Discharge (Final Nutrition Goals Re-Evaluation):   Psychosocial: Target Goals: Acknowledge presence or absence of significant depression and/or stress, maximize coping skills, provide positive support system.  Participant is able to verbalize types and ability to use techniques and skills needed for reducing stress and depression.   Education: Stress, Anxiety, and Depression - Group verbal and visual presentation to define topics covered.  Reviews how body is impacted by stress, anxiety, and depression.  Also discusses healthy ways to reduce stress and to treat/manage anxiety and depression.  Written material given at graduation. Flowsheet Row Pulmonary Rehab from 07/05/2021 in [facility] Cardiac and Pulmonary Rehab  Date 06/21/21  Educator [facility]  Instruction Review Code 1- [employer] Understanding       Education: Sleep Hygiene -Provides group verbal and written instruction about how sleep can affect your health.  Define sleep hygiene, discuss sleep cycles and impact of sleep habits. Review good sleep hygiene tips.    Initial Review & Psychosocial Screening:  Initial Psych Review & Screening - 05/18/21 1025       Initial Review   Current issues with None Identified      Family Dynamics   Good Support System? Yes   wife, few friends     Barriers   Psychosocial barriers to participate in program There are no identifiable barriers or psychosocial needs.;The patient should benefit from training in stress management and relaxation.      Screening Interventions  Interventions Encouraged to exercise;Provide feedback about the scores to participant;To provide support and resources with identified psychosocial needs    Expected Outcomes Short Term goal: Utilizing psychosocial counselor, staff and physician to assist with identification of specific Stressors or current issues interfering with healing process. Setting desired goal for each stressor or current issue identified.;Long Term Goal: Stressors or current issues are controlled or eliminated.;Short Term goal: Identification and review with participant of any Quality of Life or Depression concerns found by scoring the questionnaire.;Long Term goal: The  participant improves quality of Life and PHQ9 Scores as seen by post scores and/or verbalization of changes             Quality of Life Scores:  Scores of 19 and below usually indicate a poorer quality of life in these areas.  A difference of  2-3 points is a clinically meaningful difference.  A difference of 2-3 points in the total score of the Quality of Life Index has been associated with significant improvement in overall quality of life, self-image, physical symptoms, and general health in studies assessing change in quality of life.  PHQ-9: Recent Review Flowsheet Data     Depression screen [facility] 2/9 06/21/2021 05/24/2021   Decreased Interest 1 1   Down, Depressed, Hopeless 0 0   PHQ - 2 Score 1 1   Altered sleeping 0 1   Tired, decreased energy 1 2   Change in appetite 0 0   Feeling bad or failure about yourself  0 0   Trouble concentrating 0 0   Moving slowly or fidgety/restless 1 1   Suicidal thoughts 0 0   PHQ-9 Score 3 5   Difficult doing work/chores Somewhat difficult  Not difficult at all      Interpretation of Total Score  Total Score Depression Severity:  1-4 = Minimal depression, 5-9 = Mild depression, 10-14 = Moderate depression, 15-19 = Moderately severe depression, 20-27 = Severe depression   Psychosocial Evaluation and Intervention:  Psychosocial Evaluation - 06/19/21 0941       Psychosocial Evaluation & Interventions   Comments Isaac [place] states he has no symptoms of anxiety or depression.  He remains tobacco free in anticipation of the procedure at [facility].  He was able to walk all the way in today which was one of his goals when he started the program.    Expected Outcomes Short: continue to exercise to help manage stress Long: maintain positive outlook             Psychosocial Re-Evaluation:   Psychosocial Discharge (Final Psychosocial Re-Evaluation):   Education: Education Goals: Education classes will be provided on a weekly basis, covering  required topics. Participant will state understanding/return demonstration of topics presented.  Learning Barriers/Preferences:  Learning Barriers/Preferences - 05/18/21 1027       Learning Barriers/Preferences   Learning Barriers None    Learning Preferences None             General Pulmonary Education Topics:  Infection Prevention: - Provides verbal and written material to individual with discussion of infection control including proper hand washing and proper equipment cleaning during exercise session. Flowsheet Row Pulmonary Rehab from 07/05/2021 in [facility] Cardiac and Pulmonary Rehab  Education need identified 05/24/21  Date 05/24/21  Educator [place]  Instruction Review Code 1- Verbalizes Understanding       Falls Prevention: - Provides verbal and written material to individual with discussion of falls prevention and safety. Flowsheet Row Pulmonary Rehab from 07/05/2021 in [facility] Cardiac  and Pulmonary Rehab  Education need identified 05/24/21  Date 05/24/21  Educator [place]  Instruction Review Code 1- Verbalizes Understanding       Chronic Lung Disease Review: - Group verbal instruction with posters, models, PowerPoint presentations and videos,  to review new updates, new respiratory medications, new advancements in procedures and treatments. Providing information on websites and "800" numbers for continued self-education. Includes information about supplement oxygen, available portable oxygen systems, continuous and intermittent flow rates, oxygen safety, concentrators, and Medicare reimbursement for oxygen. Explanation of Pulmonary Drugs, including class, frequency, complications, importance of spacers, rinsing mouth after steroid MDI's, and proper cleaning methods for nebulizers. Review of basic lung anatomy and physiology related to function, structure, and complications of lung disease. Review of risk factors. Discussion about methods for diagnosing sleep apnea and types of  masks and machines for OSA. Includes a review of the use of types of environmental controls: home humidity, furnaces, filters, dust mite/pet prevention, HEPA vacuums. Discussion about weather changes, air quality and the benefits of nasal washing. Instruction on Warning signs, infection symptoms, calling MD promptly, preventive modes, and value of vaccinations. Review of effective airway clearance, coughing and/or vibration techniques. Emphasizing that all should Create an Action Plan. Written material given at graduation. Flowsheet Row Pulmonary Rehab from 07/05/2021 in [facility] Cardiac and Pulmonary Rehab  Education need identified 05/24/21       AED/CPR: - Group verbal and written instruction with the use of models to demonstrate the basic use of the AED with the basic ABC's of resuscitation.    Anatomy and Cardiac Procedures: - Group verbal and visual presentation and models provide information about basic cardiac anatomy and function. Reviews the testing methods done to diagnose heart disease and the outcomes of the test results. Describes the treatment choices: Medical Management, Angioplasty, or Coronary Bypass Surgery for treating various heart conditions including Myocardial Infarction, Angina, Valve Disease, and Cardiac Arrhythmias.  Written material given at graduation.   Medication Safety: - Group verbal and visual instruction to review commonly prescribed medications for heart and lung disease. Reviews the medication, class of the drug, and side effects. Includes the steps to properly store meds and maintain the prescription regimen.  Written material given at graduation.   Other: -Provides group and verbal instruction on various topics (see comments)   Knowledge Questionnaire Score:  Knowledge Questionnaire Score - 05/24/21 1208       Knowledge Questionnaire Score   Pre Score 17/18: Oxygen              Core Components/Risk Factors/Patient Goals at Admission:  Personal  Goals and Risk Factors at Admission - 05/24/21 1418       Core Components/Risk Factors/Patient Goals on Admission    Weight Management Yes;Weight Loss   Patient goal; BMI < 25   Intervention Weight Management: Develop a combined nutrition and exercise program designed to reach desired caloric intake, while maintaining appropriate intake of nutrient and fiber, sodium and fats, and appropriate energy expenditure required for the weight goal.;Weight Management: Provide education and appropriate resources to help participant work on and attain dietary goals.;Weight Management/Obesity: Establish reasonable short term and long term weight goals.    Admit Weight 176 lb (79.8 kg)    Goal Weight: Short Term 171 lb (77.6 kg)    Goal Weight: Long Term 165 lb (74.8 kg)    Expected Outcomes Short Term: Continue to assess and modify interventions until short term weight is achieved;Long Term: Adherence to nutrition and physical activity/exercise program  aimed toward attainment of established weight goal;Weight Loss: Understanding of general recommendations for a balanced deficit meal plan, which promotes 1-2 lb weight loss per week and includes a negative energy balance of [phone] kcal/d;Understanding recommendations for meals to include 15-35% energy as protein, 25-35% energy from fat, 35-60% energy from carbohydrates, less than $RemoveB'200mg'lxZdlglu$  of dietary cholesterol, 20-35 gm of total fiber daily;Understanding of distribution of calorie intake throughout the day with the consumption of 4-5 meals/snacks    Tobacco Cessation Yes    Number of packs per day [place] has recently quit tobacco use within the last 6 months. Intervention for relapse prevention was provided at the initial medical review. He was encouraged to continue to with tobacco cessation and was provided information on relapse prevention. Patient received information about combination therapy, tobacco cessation classes, quit line, and quit smoking apps in case of  a relapse. Patient demonstrated understanding of this material.Staff will continue to provide encouragement and follow up with the patient throughout the program.    Intervention Assist the participant in steps to quit. Provide individualized education and counseling about committing to Tobacco Cessation, relapse prevention, and pharmacological support that can be provided by physician.;Advice worker, assist with locating and accessing local/national Quit Smoking programs, and support quit date choice.    Expected Outcomes Short Term: Will demonstrate readiness to quit, by selecting a quit date.;Short Term: Will quit all tobacco product use, adhering to prevention of relapse plan.;Long Term: Complete abstinence from all tobacco products for at least 12 months from quit date.    Improve shortness of breath with ADL's Yes    Intervention Provide education, individualized exercise plan and daily activity instruction to help decrease symptoms of SOB with activities of daily living.    Expected Outcomes Short Term: Improve cardiorespiratory fitness to achieve a reduction of symptoms when performing ADLs;Long Term: Be able to perform more ADLs without symptoms or delay the onset of symptoms    Increase knowledge of respiratory medications and ability to use respiratory devices properly  Yes    Intervention Provide education and demonstration as needed of appropriate use of medications, inhalers, and oxygen therapy.    Expected Outcomes Short Term: Achieves understanding of medications use. Understands that oxygen is a medication prescribed by physician. Demonstrates appropriate use of inhaler and oxygen therapy.;Long Term: Maintain appropriate use of medications, inhalers, and oxygen therapy.    Intervention Provide education on lifestyle modifcations including regular physical activity/exercise, weight management, moderate sodium restriction and increased consumption of fresh fruit, vegetables,  and low fat dairy, alcohol moderation, and smoking cessation.;Monitor prescription use compliance.    Expected Outcomes Short Term: Continued assessment and intervention until BP is < 140/49mm HG in hypertensive participants. < 130/55mm HG in hypertensive participants with diabetes, heart failure or chronic kidney disease.;Long Term: Maintenance of blood pressure at goal levels.             Education:Diabetes - Individual verbal and written instruction to review signs/symptoms of diabetes, desired ranges of glucose level fasting, after meals and with exercise. Acknowledge that pre and post exercise glucose checks will be done for 3 sessions at entry of program.   Know Your Numbers and Heart Failure: - Group verbal and visual instruction to discuss disease risk factors for cardiac and pulmonary disease and treatment options.  Reviews associated critical values for Overweight/Obesity, Hypertension, Cholesterol, and Diabetes.  Discusses basics of heart failure: signs/symptoms and treatments.  Introduces Heart Failure Zone chart for action plan for heart failure.  Written material given at graduation.   Core Components/Risk Factors/Patient Goals Review:   Goals and Risk Factor Review     Row Name 06/19/21 0939             Core Components/Risk Factors/Patient Goals Review   Personal Goals Review Tobacco Cessation;Develop more efficient breathing techniques such as purse lipped breathing and diaphragmatic breathing and practicing self-pacing with activity.;Improve shortness of breath with ADL's;Hypertension       Review Isaac [place] is still not smoking in anticipation of a surgery to help his lungs/breathing.  It is an experimental procedure.  He is taking meds as directed.  He uses PLB when needed.       Expected Outcomes Short:  maintain tobacco free Long: become proficient at PLB                Core Components/Risk Factors/Patient Goals at Discharge (Final Review):   Goals and Risk Factor  Review - 06/19/21 0939       Core Components/Risk Factors/Patient Goals Review   Personal Goals Review Tobacco Cessation;Develop more efficient breathing techniques such as purse lipped breathing and diaphragmatic breathing and practicing self-pacing with activity.;Improve shortness of breath with ADL's;Hypertension    Review Isaac [place] is still not smoking in anticipation of a surgery to help his lungs/breathing.  It is an experimental procedure.  He is taking meds as directed.  He uses PLB when needed.    Expected Outcomes Short:  maintain tobacco free Long: become proficient at PLB             ITP Comments:  ITP Comments     Row Name 05/18/21 1049 05/24/21 1407 05/29/21 0925 06/13/21 1411 06/26/21 1109   ITP Comments Virtual orientation call completed today. he has an appointment on Date: 05/24/2021  for EP eval and gym Orientation.  Documentation of diagnosis can be found in [facility] Date: 08/02/2022[name] has recently quit tobacco use within the last 6 months. Intervention for relapse prevention was provided at the initial medical review. He was encouraged to continue to with tobacco cessation and was provided information on relapse prevention. Patient received information about combination therapy, tobacco cessation classes, quit line, and quit smoking apps in case of a relapse. Patient demonstrated understanding of this material.Staff will continue to provide encouragement and follow up with the patient throughout the program. Completed 6MWT and gym orientation. Initial ITP created and sent for review to Dr. Ottie Glazier, Medical Director. First full day of exercise!  Patient was oriented to gym and equipment including functions, settings, policies, and procedures.  Patient's individual exercise prescription and treatment plan were reviewed.  All starting workloads were established based on the results of the 6 minute walk test done at initial orientation visit.  The plan for exercise progression  was also introduced and progression will be customized based on patient's performance and goals. 30 day review completed. ITP sent to Dr. Zetta Bills, Medical Director of  Pulmonary Rehab. Continue with ITP unless changes are made by physician. Completed initial RD consultation    Row Name 07/11/21 0804           ITP Comments 30 Day review completed. Medical Director ITP review done, changes made as directed, and signed approval by Medical Director.                Comments:  30 Day review completed. Medical Director ITP review done, changes made as directed, and signed approval by Medical Director.

## 2021-07-17 ENCOUNTER — Other Ambulatory Visit: Payer: Self-pay

## 2021-07-17 DIAGNOSIS — J449 Chronic obstructive pulmonary disease, unspecified: Secondary | ICD-10-CM | POA: Diagnosis not present

## 2021-07-17 NOTE — Progress Notes (Signed)
Daily Session Note  Patient Details  Name: Isaac [place] MRN: [id number] Date of Birth: [birth year] Referring Provider:   Flowsheet Row Pulmonary Rehab from 05/24/2021 in [facility] Cardiac and Pulmonary Rehab  Referring Provider [name] [facility])       Encounter Date: 07/17/2021  Check In:  Session Check In - 07/17/21 0936       Check-In   Supervising physician immediately available to respond to emergencies See telemetry face sheet for immediately available ER MD    Location [facility]-Cardiac & Pulmonary Rehab    Staff Present [name], MPA, RN;Amanda Oletta Darter, BA, ACSM CEP, Exercise Physiologist;Melissa Caiola, RDN, LDN    Virtual Visit No    Medication changes reported     No    Fall or balance concerns reported    No    Warm-up and Cool-down Performed on first and last piece of equipment    Resistance Training Performed Yes    VAD Patient? No    PAD/SET Patient? No      Pain Assessment   Currently in Pain? No/denies                Social History   Tobacco Use  Smoking Status Former   Packs/day: 1.50   Years: 35.00   Pack years: 52.50   Types: Cigarettes   Quit date: 03/09/2021   Years since quitting: 0.3  Smokeless Tobacco Never  Tobacco Comments   Quit   not smoking any at all    Goals Met:  Independence with exercise equipment Exercise tolerated well No report of concerns or symptoms today Strength training completed today  Goals Unmet:  Not Applicable  Comments: Pt able to follow exercise prescription today without complaint.  Will continue to monitor for progression.    Dr. Emily Filbert is Medical Director for [place] [facility] [place]  Dr. Ottie Glazier is Medical Director for [facility] Pulmonary Rehabilitation.

## 2021-07-19 ENCOUNTER — Other Ambulatory Visit: Payer: Self-pay

## 2021-07-19 DIAGNOSIS — J449 Chronic obstructive pulmonary disease, unspecified: Secondary | ICD-10-CM

## 2021-07-19 NOTE — Progress Notes (Signed)
Daily Session Note  Patient Details  Name: Isaac [place] MRN: [id number] Date of Birth: Feb 17, 1956 Referring Provider:   Flowsheet Row Pulmonary Rehab from 05/24/2021 in [facility] Cardiac and Pulmonary Rehab  Referring Provider [name] [facility])       Encounter Date: 07/19/2021  Check In:  Session Check In - 07/19/21 0913       Check-In   Supervising physician immediately available to respond to emergencies See telemetry face sheet for immediately available ER MD    Location [facility]-Cardiac & Pulmonary Rehab    Staff Present [name], MPA, RN;Jessica [place], MA, RCEP, CCRP, Rosalio Macadamia, BS, ACSM CEP, Exercise Physiologist    Virtual Visit No    Medication changes reported     No    Fall or balance concerns reported    No    Warm-up and Cool-down Performed on first and last piece of equipment    Resistance Training Performed Yes    VAD Patient? No    PAD/SET Patient? No      Pain Assessment   Currently in Pain? No/denies                Social History   Tobacco Use  Smoking Status Former   Packs/day: 1.50   Years: 35.00   Pack years: 52.50   Types: Cigarettes   Quit date: 03/09/2021   Years since quitting: 0.3  Smokeless Tobacco Never  Tobacco Comments   Quit   not smoking any at all    Goals Met:  Independence with exercise equipment Exercise tolerated well No report of concerns or symptoms today Strength training completed today  Goals Unmet:  Not Applicable  Comments: Pt able to follow exercise prescription today without complaint.  Will continue to monitor for progression.    Dr. Emily Filbert is Medical Director for [place].  Dr. Ottie Glazier is Medical Director for [facility] Pulmonary Rehabilitation.

## 2021-07-24 ENCOUNTER — Other Ambulatory Visit: Payer: Self-pay

## 2021-07-24 DIAGNOSIS — J449 Chronic obstructive pulmonary disease, unspecified: Secondary | ICD-10-CM

## 2021-07-24 NOTE — Progress Notes (Signed)
Daily Session Note  Patient Details  Name: Isaac Harrison MRN: [id number] Date of Birth: [birth year] Referring Provider:   Flowsheet Row Pulmonary Rehab from 05/24/2021 in [facility] Cardiac and Pulmonary Rehab  Referring Provider [name] [facility])       Encounter Date: 07/24/2021  Check In:  Session Check In - 07/24/21 0921       Check-In   Supervising physician immediately available to respond to emergencies See telemetry face sheet for immediately available ER MD    Location [facility]-Cardiac & Pulmonary Rehab    Staff Present [name], MPA, RN;Jessica Center, MA, RCEP, CCRP, CCET;Amanda Sommer, BA, ACSM CEP, Exercise Physiologist;Kara Eliezer Bottom, MS, ASCM CEP, Exercise Physiologist    Virtual Visit No    Medication changes reported     No    Fall or balance concerns reported    No    Warm-up and Cool-down Performed on first and last piece of equipment    Resistance Training Performed Yes    VAD Patient? No    PAD/SET Patient? No      Pain Assessment   Currently in Pain? No/denies                Social History   Tobacco Use  Smoking Status Former   Packs/day: 1.50   Years: 35.00   Pack years: 52.50   Types: Cigarettes   Quit date: 03/09/2021   Years since quitting: 0.3  Smokeless Tobacco Never  Tobacco Comments   Quit   not smoking any at all    Goals Met:  Independence with exercise equipment Exercise tolerated well No report of concerns or symptoms today Strength training completed today  Goals Unmet:  Not Applicable  Comments: Pt able to follow exercise prescription today without complaint.  Will continue to monitor for progression.    Dr. Emily Filbert is Medical Director for [place].  Dr. Ottie Glazier is Medical Director for [facility] Pulmonary Rehabilitation.

## 2021-07-26 ENCOUNTER — Other Ambulatory Visit: Payer: Self-pay

## 2021-07-26 DIAGNOSIS — J449 Chronic obstructive pulmonary disease, unspecified: Secondary | ICD-10-CM | POA: Diagnosis not present

## 2021-07-26 NOTE — Progress Notes (Signed)
Daily Session Note  Patient Details  Name: Isaac Harrison MRN: [id number] Date of Birth: 1955/09/19 Referring Provider:   Flowsheet Row Pulmonary Rehab from 05/24/2021 in [facility] Cardiac and Pulmonary Rehab  Referring Provider [name] [facility])       Encounter Date: 07/26/2021  Check In:  Session Check In - 07/26/21 0916       Check-In   Supervising physician immediately available to respond to emergencies See telemetry face sheet for immediately available ER MD    Location [facility]-Cardiac & Pulmonary Rehab    Staff Present [name], MPA, Nino Glow, MS, ASCM CEP, Exercise Physiologist;Ervie Mccard Amedeo Plenty, BS, ACSM CEP, Exercise Physiologist;Amanda Oletta Darter, BA, ACSM CEP, Exercise Physiologist    Virtual Visit No    Medication changes reported     No    Fall or balance concerns reported    No    Warm-up and Cool-down Performed on first and last piece of equipment    Resistance Training Performed Yes    VAD Patient? No    PAD/SET Patient? No      Pain Assessment   Currently in Pain? No/denies                Social History   Tobacco Use  Smoking Status Former   Packs/day: 1.50   Years: 35.00   Pack years: 52.50   Types: Cigarettes   Quit date: 03/09/2021   Years since quitting: 0.3  Smokeless Tobacco Never  Tobacco Comments   Quit   not smoking any at all    Goals Met:  Independence with exercise equipment Exercise tolerated well No report of concerns or symptoms today Strength training completed today  Goals Unmet:  Not Applicable  Comments: Pt able to follow exercise prescription today without complaint.  Will continue to monitor for progression.    Dr. Emily Filbert is Medical Director for [place].  Dr. Ottie Glazier is Medical Director for [facility] Pulmonary Rehabilitation.

## 2021-07-31 ENCOUNTER — Other Ambulatory Visit: Payer: Self-pay

## 2021-07-31 DIAGNOSIS — J449 Chronic obstructive pulmonary disease, unspecified: Secondary | ICD-10-CM

## 2021-07-31 NOTE — Progress Notes (Signed)
Daily Session Note  Patient Details  Name: Isaac Harrison MRN: [id number] Date of Birth: 02-Apr-1956 Referring Provider:   Flowsheet Row Pulmonary Rehab from 05/24/2021 in [facility] Cardiac and Pulmonary Rehab  Referring Provider [name] [facility])       Encounter Date: 07/31/2021  Check In:  Session Check In - 07/31/21 0918       Check-In   Supervising physician immediately available to respond to emergencies See telemetry face sheet for immediately available ER MD    Location [facility]-Cardiac & Pulmonary Rehab    Staff Present [name], MPA, Nino Glow, MS, ASCM CEP, Exercise Physiologist;Melissa Collins, RDN, Rowe Pavy, BA, ACSM CEP, Exercise Physiologist    Virtual Visit No    Medication changes reported     No    Fall or balance concerns reported    No    Warm-up and Cool-down Performed on first and last piece of equipment    Resistance Training Performed Yes    VAD Patient? No    PAD/SET Patient? No      Pain Assessment   Currently in Pain? No/denies                Social History   Tobacco Use  Smoking Status Former   Packs/day: 1.50   Years: 35.00   Pack years: 52.50   Types: Cigarettes   Quit date: 03/09/2021   Years since quitting: 0.3  Smokeless Tobacco Never  Tobacco Comments   Quit   not smoking any at all    Goals Met:  Independence with exercise equipment Exercise tolerated well No report of concerns or symptoms today Strength training completed today  Goals Unmet:  Not Applicable  Comments: Pt able to follow exercise prescription today without complaint.  Will continue to monitor for progression.    Dr. Emily Filbert is Medical Director for [place].  Dr. Ottie Glazier is Medical Director for [facility] Pulmonary Rehabilitation.

## 2021-08-07 ENCOUNTER — Other Ambulatory Visit: Payer: Self-pay

## 2021-08-07 DIAGNOSIS — J449 Chronic obstructive pulmonary disease, unspecified: Secondary | ICD-10-CM | POA: Diagnosis not present

## 2021-08-07 NOTE — Progress Notes (Signed)
Daily Session Note  Patient Details  Name: Isaac Harrison MRN: [id number] Date of Birth: Jan 23, 1956 Referring Provider:   Flowsheet Row Pulmonary Rehab from 05/24/2021 in [facility] Cardiac and Pulmonary Rehab  Referring Provider [name] [facility])       Encounter Date: 08/07/2021  Check In:  Session Check In - 08/07/21 0910       Check-In   Supervising physician immediately available to respond to emergencies See telemetry face sheet for immediately available ER MD    Location [facility]-Cardiac & Pulmonary Rehab    Staff Present [name], MPA, RN;Amanda Sommer, BA, ACSM CEP, Exercise Physiologist;Jessica Luan Pulling, MA, RCEP, CCRP, CCET    Virtual Visit No    Medication changes reported     No    Fall or balance concerns reported    No    Warm-up and Cool-down Performed on first and last piece of equipment    Resistance Training Performed Yes    VAD Patient? No    PAD/SET Patient? No      Pain Assessment   Currently in Pain? No/denies                Social History   Tobacco Use  Smoking Status Former   Packs/day: 1.50   Years: 35.00   Pack years: 52.50   Types: Cigarettes   Quit date: 03/09/2021   Years since quitting: 0.4  Smokeless Tobacco Never  Tobacco Comments   Quit   not smoking any at all    Goals Met:  Independence with exercise equipment Exercise tolerated well No report of concerns or symptoms today Strength training completed today  Goals Unmet:  Not Applicable  Comments: Pt able to follow exercise prescription today without complaint.  Will continue to monitor for progression.    Dr. Emily Filbert is Medical Director for [place].  Dr. Ottie Glazier is Medical Director for [facility] Pulmonary Rehabilitation.

## 2021-08-08 ENCOUNTER — Encounter: Payer: Self-pay | Admitting: *Deleted

## 2021-08-08 DIAGNOSIS — J449 Chronic obstructive pulmonary disease, unspecified: Secondary | ICD-10-CM

## 2021-08-08 NOTE — Progress Notes (Signed)
Pulmonary Individual Treatment Plan  Patient Details  Name: Isaac Harrison MRN: [id number] Date of Birth: 1956-03-10 Referring Provider:   Flowsheet Row Pulmonary Rehab from 05/24/2021 in [facility] Cardiac and Pulmonary Rehab  Referring Provider [name] [facility])       Initial Encounter Date:  Flowsheet Row Pulmonary Rehab from 05/24/2021 in [facility] Cardiac and Pulmonary Rehab  Date 05/24/21       Visit Diagnosis: Chronic obstructive pulmonary disease, unspecified COPD type ([place])  Patient's Home Medications on Admission:  Current Outpatient Medications:    albuterol (PROVENTIL HFA;VENTOLIN HFA) 108 (90 Base) MCG/ACT inhaler, Inhale 2 puffs into the lungs every 6 (six) hours as needed for wheezing or shortness of breath., Disp: 1 Inhaler, Rfl: 0   albuterol (PROVENTIL) (2.5 MG/3ML) 0.083% nebulizer solution, USE 1 AMPULE BY ORAL INHALATION FOUR TIMES A DAY AS NEEDED FOR BREATHING / SHORTNESS OF BREATH, Disp: , Rfl:    amLODipine (NORVASC) 10 MG tablet, Take by mouth., Disp: , Rfl:    azithromycin (ZITHROMAX Z-PAK) 250 MG tablet, Take 2 tablets (500 mg) on  Day 1,  followed by 1 tablet (250 mg) once daily on Days 2 through 5. (Patient not taking: Reported on 04/07/2018), Disp: 6 each, Rfl: 0   Budeson-Glycopyrrol-Formoterol (BREZTRI AEROSPHERE) 160-9-4.8 MCG/ACT AERO, Inhale 2 Inhalers into the lungs 2 (two) times daily., Disp: , Rfl:    fluticasone (FLONASE) 50 MCG/ACT nasal spray, INSTILL 2 SPRAYS INTO EACH NOSTRIL ONCE EVERY DAY MAXIMUM 2 SPRAYS IN EACH NOSTRIL DAILY. FOR ALLERGIES, Disp: , Rfl:    guaiFENesin (MUCINEX) 600 MG 12 hr tablet, Take 1 tablet by mouth 2 (two) times daily., Disp: , Rfl:    predniSONE (DELTASONE) 20 MG tablet, Take 3 tablets (60 mg total) by mouth daily., Disp: 12 tablet, Rfl: 0   predniSONE (DELTASONE) 20 MG tablet, Take 3 tablets (60 mg total) by mouth daily. (Patient not taking: Reported on 05/18/2021), Disp: 12 tablet, Rfl: 0   Spacer/Aero Chamber Mouthpiece  MISC, 1 Units by Does not apply route every 4 (four) hours as needed (wheezing). (Patient not taking: Reported on 04/07/2018), Disp: 1 each, Rfl: 0  Past Medical History: Past Medical History:  Diagnosis Date   COPD (chronic obstructive pulmonary disease) ([place])     Tobacco Use: Social History   Tobacco Use  Smoking Status Former   Packs/day: 1.50   Years: 35.00   Pack years: 52.50   Types: Cigarettes   Quit date: 03/09/2021   Years since quitting: 0.4  Smokeless Tobacco Never  Tobacco Comments   Quit   not smoking any at all    Labs: Recent Review Flowsheet Data     Labs for ITP Cardiac and Pulmonary Rehab Latest Ref Rng & Units 04/07/2018   HCO3 20.0 - 28.0 mmol/L 27.1   ACIDBASEDEF 0.0 - 2.0 mmol/L 0.6        Pulmonary Assessment Scores:  Pulmonary Assessment Scores     Row Name 05/24/21 1154         ADL UCSD   ADL Phase Entry     SOB Score total 52     Rest 1     Walk 3     Stairs 4     Bath 3     Dress 3     Shop 3       CAT Score   CAT Score 29       mMRC Score   mMRC Score 2.5  UCSD: Self-administered rating of dyspnea associated with activities of daily living (ADLs) 6-point scale (0 = "not at all" to 5 = "maximal or unable to do because of breathlessness")  Scoring Scores range from 0 to 120.  Minimally important difference is 5 units  CAT: CAT can identify the health impairment of COPD patients and is better correlated with disease progression.  CAT has a scoring range of zero to 40. The CAT score is classified into four groups of low (less than 10), medium (10 - 20), high (21-30) and very high (31-40) based on the impact level of disease on health status. A CAT score over 10 suggests significant symptoms.  A worsening CAT score could be explained by an exacerbation, poor medication adherence, poor inhaler technique, or progression of COPD or comorbid conditions.  CAT MCID is 2 points  mMRC: mMRC (Modified Medical Research  Council) Dyspnea Scale is used to assess the degree of baseline functional disability in patients of respiratory disease due to dyspnea. No minimal important difference is established. A decrease in score of 1 point or greater is considered a positive change.   Pulmonary Function Assessment:   Exercise Target Goals: Exercise Program Goal: Individual exercise prescription set using results from initial 6 min walk test and THRR while considering  patient's activity barriers and safety.   Exercise Prescription Goal: Initial exercise prescription builds to 30-45 minutes a day of aerobic activity, 2-3 days per week.  Home exercise guidelines will be given to patient during program as part of exercise prescription that the participant will acknowledge.  Education: Aerobic Exercise: - Group verbal and visual presentation on the components of exercise prescription. Introduces F.I.T.T principle from ACSM for exercise prescriptions.  Reviews F.I.T.T. principles of aerobic exercise including progression. Written material given at graduation. Flowsheet Row Pulmonary Rehab from 07/26/2021 in [facility] Cardiac and Pulmonary Rehab  Date 07/05/21  Educator [facility]  Instruction Review Code 1- Verbalizes Understanding       Education: Resistance Exercise: - Group verbal and visual presentation on the components of exercise prescription. Introduces F.I.T.T principle from ACSM for exercise prescriptions  Reviews F.I.T.T. principles of resistance exercise including progression. Written material given at graduation.    Education: Exercise & Equipment Safety: - Individual verbal instruction and demonstration of equipment use and safety with use of the equipment. Flowsheet Row Pulmonary Rehab from 07/26/2021 in [facility] Cardiac and Pulmonary Rehab  Education need identified 05/24/21  Date 05/24/21  Educator [place]  Instruction Review Code 1- Verbalizes Understanding       Education: Exercise Physiology & General  Exercise Guidelines: - Group verbal and written instruction with models to review the exercise physiology of the cardiovascular system and associated critical values. Provides general exercise guidelines with specific guidelines to those with heart or lung disease.  Flowsheet Row Pulmonary Rehab from 07/26/2021 in [facility] Cardiac and Pulmonary Rehab  Date 06/28/21  Educator [facility]  Instruction Review Code 1- Verbalizes Understanding       Education: Flexibility, Balance, Mind/Body Relaxation: - Group verbal and visual presentation with interactive activity on the components of exercise prescription. Introduces F.I.T.T principle from ACSM for exercise prescriptions. Reviews F.I.T.T. principles of flexibility and balance exercise training including progression. Also discusses the mind body connection.  Reviews various relaxation techniques to help reduce and manage stress (i.e. Deep breathing, progressive muscle relaxation, and visualization). Balance handout provided to take home. Written material given at graduation. Flowsheet Row Pulmonary Rehab from 07/26/2021 in [facility] Cardiac and Pulmonary Rehab  Date 07/19/21  Educator [facility]  Instruction Review Code 1- Verbalizes Understanding       Activity Barriers & Risk Stratification:  Activity Barriers & Cardiac Risk Stratification - 05/24/21 1202       Activity Barriers & Cardiac Risk Stratification   Activity Barriers Shortness of Breath;Deconditioning             6 Minute Walk:  6 Minute Walk     Row Name 05/24/21 1408         6 Minute Walk   Phase Initial     Distance 975 feet     Walk Time 6 minutes     # of Rest Breaks 0     MPH 1.84     METS 3.02     RPE 11     Perceived Dyspnea  2     VO2 Peak 10.57     Symptoms Yes (comment)     Comments SOB     Resting HR 71 bpm     Resting BP 144/74     Resting Oxygen Saturation  95 %     Exercise Oxygen Saturation  during 6 min walk 89 %     Max Ex. HR 100 bpm     Max Ex. BP 164/76      2 Minute Post BP 144/74       Interval HR   1 Minute HR 92     2 Minute HR 93     3 Minute HR 95     4 Minute HR 100     5 Minute HR 97     6 Minute HR 98     2 Minute Post HR 78     Interval Heart Rate? Yes       Interval Oxygen   Interval Oxygen? Yes     Baseline Oxygen Saturation % 95 %     1 Minute Oxygen Saturation % 92 %     1 Minute Liters of Oxygen 0 L  RA     2 Minute Oxygen Saturation % 93 %     2 Minute Liters of Oxygen 0 L     3 Minute Oxygen Saturation % 91 %     3 Minute Liters of Oxygen 0 L     4 Minute Oxygen Saturation % 89 %     4 Minute Liters of Oxygen 0 L     5 Minute Oxygen Saturation % 90 %     5 Minute Liters of Oxygen 0 L     6 Minute Oxygen Saturation % 89 %     6 Minute Liters of Oxygen 0 L     2 Minute Post Oxygen Saturation % 96 %     2 Minute Post Liters of Oxygen 0 L             Oxygen Initial Assessment:  Oxygen Initial Assessment - 05/24/21 1154       Home Oxygen   Home Oxygen Device None    Sleep Oxygen Prescription None    Home Exercise Oxygen Prescription None    Home Resting Oxygen Prescription None      Initial 6 min Walk   Oxygen Used None      Program Oxygen Prescription   Program Oxygen Prescription None      Intervention   Short Term Goals To learn and demonstrate proper pursed lip breathing techniques or other breathing techniques. ;To learn and understand importance of maintaining oxygen saturations>88%;To learn  and demonstrate proper use of respiratory medications;To learn and understand importance of monitoring SPO2 with pulse oximeter and demonstrate accurate use of the pulse oximeter.    Long  Term Goals Verbalizes importance of monitoring SPO2 with pulse oximeter and return demonstration;Maintenance of O2 saturations>88%;Exhibits proper breathing techniques, such as pursed lip breathing or other method taught during program session;Compliance with respiratory medication;Demonstrates proper use of MDI's              Oxygen Re-Evaluation:  Oxygen Re-Evaluation     Row Name 05/29/21 0925 06/19/21 1023 07/19/21 0944         Program Oxygen Prescription   Program Oxygen Prescription None None None       Home Oxygen   Home Oxygen Device None None None     Sleep Oxygen Prescription None None None     Home Exercise Oxygen Prescription None None None     Home Resting Oxygen Prescription None None None     Compliance with Home Oxygen Use -- Yes Yes       Goals/Expected Outcomes   Short Term Goals To learn and demonstrate proper pursed lip breathing techniques or other breathing techniques. ;To learn and understand importance of maintaining oxygen saturations>88%;To learn and understand importance of monitoring SPO2 with pulse oximeter and demonstrate accurate use of the pulse oximeter. To learn and demonstrate proper pursed lip breathing techniques or other breathing techniques. ;To learn and understand importance of maintaining oxygen saturations>88%;To learn and understand importance of monitoring SPO2 with pulse oximeter and demonstrate accurate use of the pulse oximeter. To learn and demonstrate proper pursed lip breathing techniques or other breathing techniques. ;To learn and understand importance of maintaining oxygen saturations>88%;To learn and understand importance of monitoring SPO2 with pulse oximeter and demonstrate accurate use of the pulse oximeter.     Long  Term Goals Verbalizes importance of monitoring SPO2 with pulse oximeter and return demonstration;Maintenance of O2 saturations>88%;Exhibits proper breathing techniques, such as pursed lip breathing or other method taught during program session;Compliance with respiratory medication Verbalizes importance of monitoring SPO2 with pulse oximeter and return demonstration;Maintenance of O2 saturations>88%;Exhibits proper breathing techniques, such as pursed lip breathing or other method taught during program session;Compliance with respiratory  medication Verbalizes importance of monitoring SPO2 with pulse oximeter and return demonstration;Maintenance of O2 saturations>88%;Exhibits proper breathing techniques, such as pursed lip breathing or other method taught during program session;Compliance with respiratory medication     Comments Reviewed PLB technique with pt.  Talked about how it works and it's importance in maintaining their exercise saturations. Isaac Harrison uses PLB as needed and takes medication as directed. Isaac Harrison is doing well with using PLB at home and doesnt have any questions at this time.     Goals/Expected Outcomes Short: Become more profiecient at using PLB.   Long: Become independent at using PLB. Short: continue to use PLB Long: be able to do ADLS without shortness of breath Short:  continue to use PLB when needed Long: maintain/improve ability to do ADLs without rest              Oxygen Discharge (Final Oxygen Re-Evaluation):  Oxygen Re-Evaluation - 07/19/21 0944       Program Oxygen Prescription   Program Oxygen Prescription None      Home Oxygen   Home Oxygen Device None    Sleep Oxygen Prescription None    Home Exercise Oxygen Prescription None    Home Resting Oxygen Prescription None    Compliance with Home Oxygen  Use Yes      Goals/Expected Outcomes   Short Term Goals To learn and demonstrate proper pursed lip breathing techniques or other breathing techniques. ;To learn and understand importance of maintaining oxygen saturations>88%;To learn and understand importance of monitoring SPO2 with pulse oximeter and demonstrate accurate use of the pulse oximeter.    Long  Term Goals Verbalizes importance of monitoring SPO2 with pulse oximeter and return demonstration;Maintenance of O2 saturations>88%;Exhibits proper breathing techniques, such as pursed lip breathing or other method taught during program session;Compliance with respiratory medication    Comments Isaac Harrison is doing well with using PLB at home and doesnt have  any questions at this time.    Goals/Expected Outcomes Short:  continue to use PLB when needed Long: maintain/improve ability to do ADLs without rest             Initial Exercise Prescription:  Initial Exercise Prescription - 05/24/21 1400       Date of Initial Exercise RX and Referring Provider   Date 05/24/21    Referring Provider [name] ([place])      Recumbant Bike   Level 1    RPM 60    Watts 25    Minutes 15    METs 3      REL-XR   Level 1    Speed 50    Minutes 15    METs 3      T5 Nustep   Level 1    SPM 80    Minutes 15    METs 3      Track   Laps 17    Minutes 15    METs 1.92      Prescription Details   Frequency (times per week) 2    Duration Progress to 30 minutes of continuous aerobic without signs/symptoms of physical distress      Intensity   THRR 40-80% of Max Heartrate 103-136    Ratings of Perceived Exertion 11-13    Perceived Dyspnea 0-4      Progression   Progression Continue to progress workloads to maintain intensity without signs/symptoms of physical distress.      Resistance Training   Training Prescription Yes    Weight 3 lb    Reps 10-15             Perform Capillary Blood Glucose checks as needed.  Exercise Prescription Changes:   Exercise Prescription Changes     Row Name 05/24/21 1400 06/05/21 0700 06/18/21 1300 07/02/21 1000 07/16/21 1300     Response to Exercise   Blood Pressure (Admit) 144/74 122/64 128/64 120/60 126/72   Blood Pressure (Exercise) 164/76 140/70 158/64 134/70 --   Blood Pressure (Exit) 144/74 112/62 122/70 112/62 112/60   Heart Rate (Admit) 71 bpm 93 bpm 89 bpm 84 bpm 69 bpm   Heart Rate (Exercise) 100 bpm 119 bpm 104 bpm 103 bpm 106 bpm   Heart Rate (Exit) 72 bpm 98 bpm 92 bpm 82 bpm 83 bpm   Oxygen Saturation (Admit) 95 % 95 % 92 % 96 % 94 %   Oxygen Saturation (Exercise) 89 % 93 % 93 % 93 % 90 %   Oxygen Saturation (Exit) 96 % 95 % 95 % 94 % 93 %   Rating of Perceived Exertion  (Exercise) 11 13 12 12 13    Perceived Dyspnea (Exercise) 2 3 1 2 2    Symptoms SOB SOB -- SOB SOB   Comments walk test results second full day of  exercise -- -- --   Duration -- Continue with 30 min of aerobic exercise without signs/symptoms of physical distress. Continue with 30 min of aerobic exercise without signs/symptoms of physical distress. Continue with 30 min of aerobic exercise without signs/symptoms of physical distress. Continue with 30 min of aerobic exercise without signs/symptoms of physical distress.   Intensity -- THRR unchanged THRR unchanged THRR unchanged THRR unchanged     Progression   Progression -- Continue to progress workloads to maintain intensity without signs/symptoms of physical distress. Continue to progress workloads to maintain intensity without signs/symptoms of physical distress. Continue to progress workloads to maintain intensity without signs/symptoms of physical distress. Continue to progress workloads to maintain intensity without signs/symptoms of physical distress.   Average METs -- 2.56 3.13 3.06 2.45     Resistance Training   Training Prescription -- Yes Yes Yes Yes   Weight -- 3 lb 3 lb 3 lb 3 lb   Reps -- 10-15 10-15 10-15 10-15     Interval Training   Interval Training -- No No No No     Recumbant Bike   Level -- 1 -- -- --   Watts -- 15 -- -- --   Minutes -- 15 -- -- --   METs -- 2.59 -- -- --     REL-XR   Level -- $Remove'1 3 3 'DoBqVec$ --   Minutes -- $RemoveBe'15 15 15 'gNXVewlXq$ --   METs -- 3.6 4 4.4 --     T5 Nustep   Level -- 1 -- 3 3   Minutes -- 15 -- 15 15   METs -- 1.8 -- -- --     Track   Laps -- $Remov'23 23 25 30   'vFoitq$ Minutes -- $RemoveBe'15 15 15 15   'RxeWrRFFr$ METs -- 2.25 2.25 2.36 2.36     Oxygen   Maintain Oxygen Saturation -- 88% or higher 88% or higher 88% or higher 88% or higher    Row Name 07/30/21 1300             Response to Exercise   Blood Pressure (Admit) 138/68       Blood Pressure (Exit) 112/68       Heart Rate (Admit) 97 bpm       Heart Rate (Exercise)  115 bpm       Heart Rate (Exit) 97 bpm       Oxygen Saturation (Admit) 94 %       Oxygen Saturation (Exercise) 91 %       Oxygen Saturation (Exit) 94 %       Rating of Perceived Exertion (Exercise) 12       Perceived Dyspnea (Exercise) 3       Symptoms SOB       Duration Continue with 30 min of aerobic exercise without signs/symptoms of physical distress.       Intensity THRR unchanged         Progression   Progression Continue to progress workloads to maintain intensity without signs/symptoms of physical distress.       Average METs 2.57         Resistance Training   Training Prescription Yes       Weight 4 lb       Reps 10-15         Interval Training   Interval Training No         REL-XR   Level 3       Minutes 15  METs 3.1         T5 Nustep   Level 3       Minutes 15       METs 2         Track   Laps 30       Minutes 15       METs 2.63         Home Exercise Plan   Plans to continue exercise at Home (comment)  walking       Frequency Add 2 additional days to program exercise sessions.       Initial Home Exercises Provided 06/19/21         Oxygen   Maintain Oxygen Saturation 88% or higher                Exercise Comments:   Exercise Comments     Row Name 05/29/21 4765           Exercise Comments First full day of exercise!  Patient was oriented to gym and equipment including functions, settings, policies, and procedures.  Patient's individual exercise prescription and treatment plan were reviewed.  All starting workloads were established based on the results of the 6 minute walk test done at initial orientation visit.  The plan for exercise progression was also introduced and progression will be customized based on patient's performance and goals.                Exercise Goals and Review:   Exercise Goals     Row Name 05/24/21 1410             Exercise Goals   Increase Physical Activity Yes       Intervention Provide advice,  education, support and counseling about physical activity/exercise needs.;Develop an individualized exercise prescription for aerobic and resistive training based on initial evaluation findings, risk stratification, comorbidities and participant's personal goals.       Expected Outcomes Short Term: Attend rehab on a regular basis to increase amount of physical activity.;Long Term: Add in home exercise to make exercise part of routine and to increase amount of physical activity.;Long Term: Exercising regularly at least 3-5 days a week.       Increase Strength and Stamina Yes       Intervention Provide advice, education, support and counseling about physical activity/exercise needs.;Develop an individualized exercise prescription for aerobic and resistive training based on initial evaluation findings, risk stratification, comorbidities and participant's personal goals.       Expected Outcomes Short Term: Increase workloads from initial exercise prescription for resistance, speed, and METs.;Short Term: Perform resistance training exercises routinely during rehab and add in resistance training at home;Long Term: Improve cardiorespiratory fitness, muscular endurance and strength as measured by increased METs and functional capacity (6MWT)       Able to understand and use rate of perceived exertion (RPE) scale Yes       Intervention Provide education and explanation on how to use RPE scale       Expected Outcomes Short Term: Able to use RPE daily in rehab to express subjective intensity level;Long Term:  Able to use RPE to guide intensity level when exercising independently       Able to understand and use Dyspnea scale Yes       Intervention Provide education and explanation on how to use Dyspnea scale       Expected Outcomes Short Term: Able to use Dyspnea scale daily in rehab to express subjective  sense of shortness of breath during exertion;Long Term: Able to use Dyspnea scale to guide intensity level when  exercising independently       Knowledge and understanding of Target Heart Rate Range (THRR) Yes       Intervention Provide education and explanation of THRR including how the numbers were predicted and where they are located for reference       Expected Outcomes Long Term: Able to use THRR to govern intensity when exercising independently;Short Term: Able to state/look up THRR;Short Term: Able to use daily as guideline for intensity in rehab       Able to check pulse independently Yes       Intervention Provide education and demonstration on how to check pulse in carotid and radial arteries.;Review the importance of being able to check your own pulse for safety during independent exercise       Expected Outcomes Short Term: Able to explain why pulse checking is important during independent exercise;Long Term: Able to check pulse independently and accurately       Understanding of Exercise Prescription Yes       Intervention Provide education, explanation, and written materials on patient's individual exercise prescription       Expected Outcomes Short Term: Able to explain program exercise prescription;Long Term: Able to explain home exercise prescription to exercise independently                Exercise Goals Re-Evaluation :  Exercise Goals Re-Evaluation     Row Name 05/29/21 0925 06/05/21 0749 06/18/21 1352 06/19/21 1022 07/02/21 1103     Exercise Goal Re-Evaluation   Exercise Goals Review Increase Physical Activity;Able to understand and use rate of perceived exertion (RPE) scale;Knowledge and understanding of Target Heart Rate Range (THRR);Understanding of Exercise Prescription;Increase Strength and Stamina;Able to understand and use Dyspnea scale;Able to check pulse independently Increase Physical Activity;Increase Strength and Stamina;Understanding of Exercise Prescription Increase Physical Activity;Increase Strength and Stamina Increase Physical Activity;Increase Strength and  Stamina;Able to understand and use rate of perceived exertion (RPE) scale;Knowledge and understanding of Target Heart Rate Range (THRR);Able to check pulse independently Increase Physical Activity;Increase Strength and Stamina   Comments Reviewed RPE and dyspnea scales, THR and program prescription with pt today.  Pt voiced understanding and was given a copy of goals to take home. Isaac Harrison is off to a good start in rehab.  He is already at 23 laps on the track on his third visit.  We will continue to monitor his progress. Isaac Harrison is doing well so far.  Staff will encourag etrying 4 lb for strength work. Reviewed home exercise with pt today.  Pt plans to walk and consider [place] for exercise.  Reviewed THR, pulse, RPE, sign and symptoms, pulse oximetery and when to call 911 or MD.  Also discussed weather considerations and indoor options.  Pt voiced understanding. Isaac [place] is continuing to do well in rehab. He has increased to level 3 on the T5 Nustep and completed 25 laps on the track which is the most thus far. He should be encouraged to increase to 4 lbs for his handweights. Will continue to monitor   Expected Outcomes Short: Use RPE daily to regulate intensity. Long: Follow program prescription in THR. Short: Continue to attend rehab regularly Long: Continue to follow program prescription Short: try 4 lb Long: build overall stamina Short: add one day per week in addition to program sessions  Long:exercise 3-5 days per week Short: Continue to increase loads on the  track and increase handweights to 4 lbs Long: Continue to increase overall MET level    Row Name 07/16/21 1332 07/19/21 0951 07/30/21 1339         Exercise Goal Re-Evaluation   Exercise Goals Review Increase Physical Activity;Increase Strength and Stamina Increase Physical Activity;Increase Strength and Stamina Increase Physical Activity;Increase Strength and Stamina;Understanding of Exercise Prescription     Comments Isaac [place] has worked up to 30 laps  on the track. Oxygen sats have been in the 90s.  He has reached lower end of THR range  .He still needs to try 4 lb weights. Isaac [place] ha smoved up to [employer].  He walked first today and alternates which he does first for variety. Isaac [place] is doing well in rehab.  He is getting in 30 laps on the track.  We will encourage him to try 5 lb weights.  We will continue to monitor his progress.     Expected Outcomes Short: try 4 lb weights Long: improve overall stamina Short: continue to increase workloads as tolerated Long: increase average MET level Short: Try 5 lb weights Long: Continue to improve stamina              Discharge Exercise Prescription (Final Exercise Prescription Changes):  Exercise Prescription Changes - 07/30/21 1300       Response to Exercise   Blood Pressure (Admit) 138/68    Blood Pressure (Exit) 112/68    Heart Rate (Admit) 97 bpm    Heart Rate (Exercise) 115 bpm    Heart Rate (Exit) 97 bpm    Oxygen Saturation (Admit) 94 %    Oxygen Saturation (Exercise) 91 %    Oxygen Saturation (Exit) 94 %    Rating of Perceived Exertion (Exercise) 12    Perceived Dyspnea (Exercise) 3    Symptoms SOB    Duration Continue with 30 min of aerobic exercise without signs/symptoms of physical distress.    Intensity THRR unchanged      Progression   Progression Continue to progress workloads to maintain intensity without signs/symptoms of physical distress.    Average METs 2.57      Resistance Training   Training Prescription Yes    Weight 4 lb    Reps 10-15      Interval Training   Interval Training No      REL-XR   Level 3    Minutes 15    METs 3.1      T5 Nustep   Level 3    Minutes 15    METs 2      Track   Laps 30    Minutes 15    METs 2.63      Home Exercise Plan   Plans to continue exercise at Home (comment)   walking   Frequency Add 2 additional days to program exercise sessions.    Initial Home Exercises Provided 06/19/21      Oxygen   Maintain Oxygen  Saturation 88% or higher             Nutrition:  Target Goals: Understanding of nutrition guidelines, daily intake of sodium '1500mg'$ , cholesterol '200mg'$ , calories 30% from fat and 7% or less from saturated fats, daily to have 5 or more servings of fruits and vegetables.  Education: All About Nutrition: -Group instruction provided by verbal, written material, interactive activities, discussions, models, and posters to present general guidelines for heart healthy nutrition including fat, fiber, MyPlate, the role of sodium in heart healthy nutrition, utilization of  the nutrition label, and utilization of this knowledge for meal planning. Follow up email sent as well. Written material given at graduation. Flowsheet Row Pulmonary Rehab from 07/26/2021 in [facility] Cardiac and Pulmonary Rehab  Date 05/31/21  Educator [facility]  Instruction Review Code 1- Verbalizes Understanding       Biometrics:  Pre Biometrics - 05/24/21 1201       Pre Biometrics   Height 5' 11.5" (1.816 m)    Weight 176 lb 14.4 oz (80.2 kg)    BMI (Calculated) 24.33    Single Leg Stand 30 seconds              Nutrition Therapy Plan and Nutrition Goals:  Nutrition Therapy & Goals - 06/26/21 1018       Nutrition Therapy   Diet Heart healthy, low Na, pulmonary MNT    Protein (specify units) 95g    Fiber 30 grams    Whole Grain Foods 3 servings    Saturated Fats 12 max. grams    Fruits and Vegetables 8 servings/day    Sodium 1.5 grams      Personal Nutrition Goals   Nutrition Goal ST: have 1 good protein source at each meal/snack LT: limit Na <1.5g, meet protein and calorie needs consistently    Comments He has surgeries on his lungs in [place] - it is experimental and he may be a part of the placebo group. He feels short of breath after cooking. He has small frequent meals (4-6 meals) and has some shortness of breath. B: biscuit with country ham L: pot pie D: shrimp or fish 1x/week, beef stew, pork with rice and  potatoes and salads and other green vegetables. He gets a good variety of vegetables. He adds spices and not much salt, bacon grease sometimes, olive oil mostly with cooking. He enjoys cheese and eggs as well, he has peanuts as snack with sundrop. Drinks: 1 gallon of milk every two day, sundrop, sometimes rum and coke. Discussed heart healthy eating and pulmonary MNT.      Intervention Plan   Intervention Prescribe, educate and counsel regarding individualized specific dietary modifications aiming towards targeted core components such as weight, hypertension, lipid management, diabetes, heart failure and other comorbidities.    Expected Outcomes Short Term Goal: Understand basic principles of dietary content, such as calories, fat, sodium, cholesterol and nutrients.;Short Term Goal: A plan has been developed with personal nutrition goals set during dietitian appointment.;Long Term Goal: Adherence to prescribed nutrition plan.             Nutrition Assessments:  MEDIFICTS Score Key: ?70 Need to make dietary changes  40-70 Heart Healthy Diet ? 40 Therapeutic Level Cholesterol Diet  Flowsheet Row Pulmonary Rehab from 05/24/2021 in [facility] Cardiac and Pulmonary Rehab  Picture Your Plate Total Score on Admission 54      Picture Your Plate Scores: <35 Unhealthy dietary pattern with much room for improvement. 41-50 Dietary pattern unlikely to meet recommendations for good health and room for improvement. 51-60 More healthful dietary pattern, with some room for improvement.  >60 Healthy dietary pattern, although there may be some specific behaviors that could be improved.   Nutrition Goals Re-Evaluation:  Nutrition Goals Re-Evaluation     [place] Name 07/19/21 0941             Goals   Comment Isaac [place] has cut back on red meat and eats more fish.  He does eat green vegetables.  He is using frozen vegetables more than canned  to reduce sodium.       Expected Outcome ST/LT:  continue heart healthy  diet                Nutrition Goals Discharge (Final Nutrition Goals Re-Evaluation):  Nutrition Goals Re-Evaluation - 07/19/21 0941       Goals   Comment Isaac [place] has cut back on red meat and eats more fish.  He does eat green vegetables.  He is using frozen vegetables more than canned to reduce sodium.    Expected Outcome ST/LT:  continue heart healthy diet             Psychosocial: Target Goals: Acknowledge presence or absence of significant depression and/or stress, maximize coping skills, provide positive support system. Participant is able to verbalize types and ability to use techniques and skills needed for reducing stress and depression.   Education: Stress, Anxiety, and Depression - Group verbal and visual presentation to define topics covered.  Reviews how body is impacted by stress, anxiety, and depression.  Also discusses healthy ways to reduce stress and to treat/manage anxiety and depression.  Written material given at graduation. Flowsheet Row Pulmonary Rehab from 07/26/2021 in [facility] Cardiac and Pulmonary Rehab  Date 06/21/21  Educator [facility]  Instruction Review Code 1- [employer] Understanding       Education: Sleep Hygiene -Provides group verbal and written instruction about how sleep can affect your health.  Define sleep hygiene, discuss sleep cycles and impact of sleep habits. Review good sleep hygiene tips.    Initial Review & Psychosocial Screening:  Initial Psych Review & Screening - 05/18/21 1025       Initial Review   Current issues with None Identified      Family Dynamics   Good Support System? Yes   wife, few friends     Barriers   Psychosocial barriers to participate in program There are no identifiable barriers or psychosocial needs.;The patient should benefit from training in stress management and relaxation.      Screening Interventions   Interventions Encouraged to exercise;Provide feedback about the scores to participant;To provide support  and resources with identified psychosocial needs    Expected Outcomes Short Term goal: Utilizing psychosocial counselor, staff and physician to assist with identification of specific Stressors or current issues interfering with healing process. Setting desired goal for each stressor or current issue identified.;Long Term Goal: Stressors or current issues are controlled or eliminated.;Short Term goal: Identification and review with participant of any Quality of Life or Depression concerns found by scoring the questionnaire.;Long Term goal: The participant improves quality of Life and PHQ9 Scores as seen by post scores and/or verbalization of changes             Quality of Life Scores:  Scores of 19 and below usually indicate a poorer quality of life in these areas.  A difference of  2-3 points is a clinically meaningful difference.  A difference of 2-3 points in the total score of the Quality of Life Index has been associated with significant improvement in overall quality of life, self-image, physical symptoms, and general health in studies assessing change in quality of life.  PHQ-9: Recent Review Flowsheet Data     Depression screen [facility] 2/9 06/21/2021 05/24/2021   Decreased Interest 1 1   Down, Depressed, Hopeless 0 0   PHQ - 2 Score 1 1   Altered sleeping 0 1   Tired, decreased energy 1 2   Change in appetite 0 0  Feeling bad or failure about yourself  0 0   Trouble concentrating 0 0   Moving slowly or fidgety/restless 1 1   Suicidal thoughts 0 0   PHQ-9 Score 3 5   Difficult doing work/chores Somewhat difficult  Not difficult at all      Interpretation of Total Score  Total Score Depression Severity:  1-4 = Minimal depression, 5-9 = Mild depression, 10-14 = Moderate depression, 15-19 = Moderately severe depression, 20-27 = Severe depression   Psychosocial Evaluation and Intervention:  Psychosocial Evaluation - 06/19/21 0941       Psychosocial Evaluation & Interventions    Comments Isaac [place] states he has no symptoms of anxiety or depression.  He remains tobacco free in anticipation of the procedure at [facility].  He was able to walk all the way in today which was one of his goals when he started the program.    Expected Outcomes Short: continue to exercise to help manage stress Long: maintain positive outlook             Psychosocial Re-Evaluation:  Psychosocial Re-Evaluation     Row Name 07/19/21 [phone]             Psychosocial Re-Evaluation   Comments Patient states that his mood has been good and has not been feeling depressed. He states no changes in mental status       Expected Outcomes Short: Attend [facility] stress management education to decrease stress. Long: Maintain exercise Post [facility] to keep stress at a minimum.                Psychosocial Discharge (Final Psychosocial Re-Evaluation):  Psychosocial Re-Evaluation - 07/19/21 0949       Psychosocial Re-Evaluation   Comments Patient states that his mood has been good and has not been feeling depressed. He states no changes in mental status    Expected Outcomes Short: Attend [facility] stress management education to decrease stress. Long: Maintain exercise Post [facility] to keep stress at a minimum.             Education: Education Goals: Education classes will be provided on a weekly basis, covering required topics. Participant will state understanding/return demonstration of topics presented.  Learning Barriers/Preferences:  Learning Barriers/Preferences - 05/18/21 1027       Learning Barriers/Preferences   Learning Barriers None    Learning Preferences None             General Pulmonary Education Topics:  Infection Prevention: - Provides verbal and written material to individual with discussion of infection control including proper hand washing and proper equipment cleaning during exercise session. Flowsheet Row Pulmonary Rehab from 07/26/2021 in [facility] Cardiac and  Pulmonary Rehab  Education need identified 05/24/21  Date 05/24/21  Educator [place]  Instruction Review Code 1- Verbalizes Understanding       Falls Prevention: - Provides verbal and written material to individual with discussion of falls prevention and safety. Flowsheet Row Pulmonary Rehab from 07/26/2021 in [facility] Cardiac and Pulmonary Rehab  Education need identified 05/24/21  Date 05/24/21  Educator [place]  Instruction Review Code 1- Verbalizes Understanding       Chronic Lung Disease Review: - Group verbal instruction with posters, models, PowerPoint presentations and videos,  to review new updates, new respiratory medications, new advancements in procedures and treatments. Providing information on websites and "800" numbers for continued self-education. Includes information about supplement oxygen, available portable oxygen systems, continuous and intermittent flow rates, oxygen safety, concentrators, and Medicare reimbursement for  oxygen. Explanation of Pulmonary Drugs, including class, frequency, complications, importance of spacers, rinsing mouth after steroid MDI's, and proper cleaning methods for nebulizers. Review of basic lung anatomy and physiology related to function, structure, and complications of lung disease. Review of risk factors. Discussion about methods for diagnosing sleep apnea and types of masks and machines for OSA. Includes a review of the use of types of environmental controls: home humidity, furnaces, filters, dust mite/pet prevention, HEPA vacuums. Discussion about weather changes, air quality and the benefits of nasal washing. Instruction on Warning signs, infection symptoms, calling MD promptly, preventive modes, and value of vaccinations. Review of effective airway clearance, coughing and/or vibration techniques. Emphasizing that all should Create an Action Plan. Written material given at graduation. Flowsheet Row Pulmonary Rehab from 07/26/2021 in [facility] Cardiac and  Pulmonary Rehab  Education need identified 05/24/21       AED/CPR: - Group verbal and written instruction with the use of models to demonstrate the basic use of the AED with the basic ABC's of resuscitation.    Anatomy and Cardiac Procedures: - Group verbal and visual presentation and models provide information about basic cardiac anatomy and function. Reviews the testing methods done to diagnose heart disease and the outcomes of the test results. Describes the treatment choices: Medical Management, Angioplasty, or Coronary Bypass Surgery for treating various heart conditions including Myocardial Infarction, Angina, Valve Disease, and Cardiac Arrhythmias.  Written material given at graduation.   Medication Safety: - Group verbal and visual instruction to review commonly prescribed medications for heart and lung disease. Reviews the medication, class of the drug, and side effects. Includes the steps to properly store meds and maintain the prescription regimen.  Written material given at graduation.   Other: -Provides group and verbal instruction on various topics (see comments)   Knowledge Questionnaire Score:  Knowledge Questionnaire Score - 05/24/21 1208       Knowledge Questionnaire Score   Pre Score 17/18: Oxygen              Core Components/Risk Factors/Patient Goals at Admission:  Personal Goals and Risk Factors at Admission - 05/24/21 1418       Core Components/Risk Factors/Patient Goals on Admission    Weight Management Yes;Weight Loss   Patient goal; BMI < 25   Intervention Weight Management: Develop a combined nutrition and exercise program designed to reach desired caloric intake, while maintaining appropriate intake of nutrient and fiber, sodium and fats, and appropriate energy expenditure required for the weight goal.;Weight Management: Provide education and appropriate resources to help participant work on and attain dietary goals.;Weight Management/Obesity:  Establish reasonable short term and long term weight goals.    Admit Weight 176 lb (79.8 kg)    Goal Weight: Short Term 171 lb (77.6 kg)    Goal Weight: Long Term 165 lb (74.8 kg)    Expected Outcomes Short Term: Continue to assess and modify interventions until short term weight is achieved;Long Term: Adherence to nutrition and physical activity/exercise program aimed toward attainment of established weight goal;Weight Loss: Understanding of general recommendations for a balanced deficit meal plan, which promotes 1-2 lb weight loss per week and includes a negative energy balance of [phone] kcal/d;Understanding recommendations for meals to include 15-35% energy as protein, 25-35% energy from fat, 35-60% energy from carbohydrates, less than 200mg  of dietary cholesterol, 20-35 gm of total fiber daily;Understanding of distribution of calorie intake throughout the day with the consumption of 4-5 meals/snacks    Tobacco Cessation Yes  Number of packs per day Isaac [place] has recently quit tobacco use within the last 6 months. Intervention for relapse prevention was provided at the initial medical review. He was encouraged to continue to with tobacco cessation and was provided information on relapse prevention. Patient received information about combination therapy, tobacco cessation classes, quit line, and quit smoking apps in case of a relapse. Patient demonstrated understanding of this material.Staff will continue to provide encouragement and follow up with the patient throughout the program.    Intervention Assist the participant in steps to quit. Provide individualized education and counseling about committing to Tobacco Cessation, relapse prevention, and pharmacological support that can be provided by physician.;Advice worker, assist with locating and accessing local/national Quit Smoking programs, and support quit date choice.    Expected Outcomes Short Term: Will demonstrate readiness to  quit, by selecting a quit date.;Short Term: Will quit all tobacco product use, adhering to prevention of relapse plan.;Long Term: Complete abstinence from all tobacco products for at least 12 months from quit date.    Improve shortness of breath with ADL's Yes    Intervention Provide education, individualized exercise plan and daily activity instruction to help decrease symptoms of SOB with activities of daily living.    Expected Outcomes Short Term: Improve cardiorespiratory fitness to achieve a reduction of symptoms when performing ADLs;Long Term: Be able to perform more ADLs without symptoms or delay the onset of symptoms    Increase knowledge of respiratory medications and ability to use respiratory devices properly  Yes    Intervention Provide education and demonstration as needed of appropriate use of medications, inhalers, and oxygen therapy.    Expected Outcomes Short Term: Achieves understanding of medications use. Understands that oxygen is a medication prescribed by physician. Demonstrates appropriate use of inhaler and oxygen therapy.;Long Term: Maintain appropriate use of medications, inhalers, and oxygen therapy.    Intervention Provide education on lifestyle modifcations including regular physical activity/exercise, weight management, moderate sodium restriction and increased consumption of fresh fruit, vegetables, and low fat dairy, alcohol moderation, and smoking cessation.;Monitor prescription use compliance.    Expected Outcomes Short Term: Continued assessment and intervention until BP is < 140/21mm HG in hypertensive participants. < 130/18mm HG in hypertensive participants with diabetes, heart failure or chronic kidney disease.;Long Term: Maintenance of blood pressure at goal levels.             Education:Diabetes - Individual verbal and written instruction to review signs/symptoms of diabetes, desired ranges of glucose level fasting, after meals and with exercise. Acknowledge  that pre and post exercise glucose checks will be done for 3 sessions at entry of program.   Know Your Numbers and Heart Failure: - Group verbal and visual instruction to discuss disease risk factors for cardiac and pulmonary disease and treatment options.  Reviews associated critical values for Overweight/Obesity, Hypertension, Cholesterol, and Diabetes.  Discusses basics of heart failure: signs/symptoms and treatments.  Introduces Heart Failure Zone chart for action plan for heart failure.  Written material given at graduation.   Core Components/Risk Factors/Patient Goals Review:   Goals and Risk Factor Review     Row Name 06/19/21 0939 07/19/21 0936           Core Components/Risk Factors/Patient Goals Review   Personal Goals Review Tobacco Cessation;Develop more efficient breathing techniques such as purse lipped breathing and diaphragmatic breathing and practicing self-pacing with activity.;Improve shortness of breath with ADL's;Hypertension Tobacco Cessation;Improve shortness of breath with ADL's;Develop more efficient breathing techniques such as purse  lipped breathing and diaphragmatic breathing and practicing self-pacing with activity.      Review Isaac [place] is still not smoking in anticipation of a surgery to help his lungs/breathing.  It is an experimental procedure.  He is taking meds as directed.  He uses PLB when needed. Isaac [place] remains tobacco free.  He has had half of the experimental procedure and has the other half scheduled for 12/7.  He says hi sbreathing seems to be a little better.  He can do more wihtout having to rest.  He feels this is from exercise.  He does monitor oxygen and BP at home.  BP averages 125-135/70-80.  Oxygen stays above 88%.  He uses PLB and feels comfortable with it.      Expected Outcomes Short:  maintain tobacco free Long: become proficient at PLB Short: maintain tobacco free status Long: continue to monitor BP and oxygen at home               Core  Components/Risk Factors/Patient Goals at Discharge (Final Review):   Goals and Risk Factor Review - 07/19/21 0936       Core Components/Risk Factors/Patient Goals Review   Personal Goals Review Tobacco Cessation;Improve shortness of breath with ADL's;Develop more efficient breathing techniques such as purse lipped breathing and diaphragmatic breathing and practicing self-pacing with activity.    Review Isaac [place] remains tobacco free.  He has had half of the experimental procedure and has the other half scheduled for 12/7.  He says hi sbreathing seems to be a little better.  He can do more wihtout having to rest.  He feels this is from exercise.  He does monitor oxygen and BP at home.  BP averages 125-135/70-80.  Oxygen stays above 88%.  He uses PLB and feels comfortable with it.    Expected Outcomes Short: maintain tobacco free status Long: continue to monitor BP and oxygen at home             ITP Comments:  ITP Comments     Row Name 05/18/21 1049 05/24/21 1407 05/29/21 0925 06/13/21 1411 06/26/21 1109   ITP Comments Virtual orientation call completed today. he has an appointment on Date: 05/24/2021  for EP eval and gym Orientation.  Documentation of diagnosis can be found in [facility] Date: 08/02/2022[name] has recently quit tobacco use within the last 6 months. Intervention for relapse prevention was provided at the initial medical review. He was encouraged to continue to with tobacco cessation and was provided information on relapse prevention. Patient received information about combination therapy, tobacco cessation classes, quit line, and quit smoking apps in case of a relapse. Patient demonstrated understanding of this material.Staff will continue to provide encouragement and follow up with the patient throughout the program. Completed 6MWT and gym orientation. Initial ITP created and sent for review to Dr. Ottie Glazier, Medical Director. First full day of exercise!  Patient was oriented to gym and  equipment including functions, settings, policies, and procedures.  Patient's individual exercise prescription and treatment plan were reviewed.  All starting workloads were established based on the results of the 6 minute walk test done at initial orientation visit.  The plan for exercise progression was also introduced and progression will be customized based on patient's performance and goals. 30 day review completed. ITP sent to Dr. Zetta Bills, Medical Director of  Pulmonary Rehab. Continue with ITP unless changes are made by physician. Completed initial RD consultation    [place] Name 07/11/21 [phone] 08/08/21 [phone]  ITP Comments 30 Day review completed. Medical Director ITP review done, changes made as directed, and signed approval by Medical Director. 30 Day review completed. Medical Director ITP review done, changes made as directed, and signed approval by Medical Director.               Comments:   

## 2021-08-09 ENCOUNTER — Encounter: Payer: No Typology Code available for payment source | Attending: Pulmonary Disease

## 2021-08-09 ENCOUNTER — Other Ambulatory Visit: Payer: Self-pay

## 2021-08-09 DIAGNOSIS — J449 Chronic obstructive pulmonary disease, unspecified: Secondary | ICD-10-CM | POA: Diagnosis not present

## 2021-08-09 NOTE — Progress Notes (Signed)
Daily Session Note  Patient Details  Name: Isaac Harrison MRN: [id number] Date of Birth: February 23, 1956 Referring Provider:   Flowsheet Row Pulmonary Rehab from 05/24/2021 in [facility] Cardiac and Pulmonary Rehab  Referring Provider [name] [facility])       Encounter Date: 08/09/2021  Check In:  Session Check In - 08/09/21 1287       Check-In   Supervising physician immediately available to respond to emergencies See telemetry face sheet for immediately available ER MD    Location [facility]-Cardiac & Pulmonary Rehab    Staff Present [name], MPA, Elveria Rising, BA, ACSM CEP, Exercise Physiologist;Ekam Besson Amedeo Plenty, BS, ACSM CEP, Exercise Physiologist    Virtual Visit No    Medication changes reported     No    Fall or balance concerns reported    No    Warm-up and Cool-down Performed on first and last piece of equipment    Resistance Training Performed Yes    VAD Patient? No    PAD/SET Patient? No      Pain Assessment   Currently in Pain? No/denies                Social History   Tobacco Use  Smoking Status Former   Packs/day: 1.50   Years: 35.00   Pack years: 52.50   Types: Cigarettes   Quit date: 03/09/2021   Years since quitting: 0.4  Smokeless Tobacco Never  Tobacco Comments   Quit   not smoking any at all    Goals Met:  Independence with exercise equipment Exercise tolerated well No report of concerns or symptoms today Strength training completed today  Goals Unmet:  Not Applicable  Comments: Pt able to follow exercise prescription today without complaint.  Will continue to monitor for progression.    Dr. Emily Filbert is Medical Director for [place].  Dr. Ottie Glazier is Medical Director for [facility] Pulmonary Rehabilitation.

## 2021-08-14 ENCOUNTER — Encounter: Payer: No Typology Code available for payment source | Admitting: *Deleted

## 2021-08-14 ENCOUNTER — Other Ambulatory Visit: Payer: Self-pay

## 2021-08-14 DIAGNOSIS — J449 Chronic obstructive pulmonary disease, unspecified: Secondary | ICD-10-CM

## 2021-08-14 NOTE — Progress Notes (Signed)
Daily Session Note  Patient Details  Name: Isaac Harrison MRN: [id number] Date of Birth: 09/03/1956 Referring Provider:   Flowsheet Row Pulmonary Rehab from 05/24/2021 in [facility] Cardiac and Pulmonary Rehab  Referring Provider [place], [place] ([place])       Encounter Date: 08/14/2021  Check In:  Session Check In - 08/14/21 0935       Check-In   Supervising physician immediately available to respond to emergencies See telemetry face sheet for immediately available ER MD    Location [facility]-Cardiac & Pulmonary Rehab    Staff Present Mary Jo Abernethy, RN, BSN, MA;Jessica Hawkins, MA, RCEP, CCRP, CCET;Amanda Sommer, BA, ACSM CEP, Exercise Physiologist    Virtual Visit No    Medication changes reported     No    Fall or balance concerns reported    No    Warm-up and Cool-down Performed on first and last piece of equipment    Resistance Training Performed Yes    VAD Patient? No    PAD/SET Patient? No      Pain Assessment   Currently in Pain? No/denies                Social History   Tobacco Use  Smoking Status Former   Packs/day: 1.50   Years: 35.00   Pack years: 52.50   Types: Cigarettes   Quit date: 03/09/2021   Years since quitting: 0.4  Smokeless Tobacco Never  Tobacco Comments   Quit   not smoking any at all    Goals Met:  Independence with exercise equipment Exercise tolerated well No report of concerns or symptoms today  Goals Unmet:  Not Applicable  Comments: Pt able to follow exercise prescription today without complaint.  Will continue to monitor for progression.    Dr. Mark Miller is Medical Director for [facility].  Dr. Fuad Aleskerov is Medical Director for [facility] Pulmonary Rehabilitation. 

## 2021-08-28 DIAGNOSIS — J449 Chronic obstructive pulmonary disease, unspecified: Secondary | ICD-10-CM | POA: Diagnosis not present

## 2021-08-28 NOTE — Progress Notes (Signed)
Daily Session Note  Patient Details  Name: Isaac Harrison MRN: [id number] Date of Birth: 1955/12/16 Referring Provider:   Flowsheet Row Pulmonary Rehab from 05/24/2021 in [facility] Cardiac and Pulmonary Rehab  Referring Provider [name] [facility])       Encounter Date: 08/28/2021  Check In:  Session Check In - 08/28/21 0915       Check-In   Supervising physician immediately available to respond to emergencies See telemetry face sheet for immediately available ER MD    Location [facility]-Cardiac & Pulmonary Rehab    Staff Present [name], MPA, RN;Amanda Sommer, BA, ACSM CEP, Exercise Physiologist;Jessica Luan Pulling, MA, RCEP, CCRP, CCET    Virtual Visit No    Medication changes reported     No    Fall or balance concerns reported    No    Warm-up and Cool-down Performed on first and last piece of equipment    Resistance Training Performed Yes    VAD Patient? No    PAD/SET Patient? No      Pain Assessment   Currently in Pain? No/denies                Social History   Tobacco Use  Smoking Status Former   Packs/day: 1.50   Years: 35.00   Pack years: 52.50   Types: Cigarettes   Quit date: 03/09/2021   Years since quitting: 0.4  Smokeless Tobacco Never  Tobacco Comments   Quit   not smoking any at all    Goals Met:  Independence with exercise equipment Exercise tolerated well No report of concerns or symptoms today Strength training completed today  Goals Unmet:  Not Applicable  Comments: Pt able to follow exercise prescription today without complaint.  Will continue to monitor for progression.    Dr. Emily Filbert is Medical Director for [place].  Dr. Ottie Glazier is Medical Director for [facility] Pulmonary Rehabilitation.

## 2021-08-30 ENCOUNTER — Other Ambulatory Visit: Payer: Self-pay

## 2021-08-30 DIAGNOSIS — J449 Chronic obstructive pulmonary disease, unspecified: Secondary | ICD-10-CM

## 2021-08-30 NOTE — Progress Notes (Signed)
Daily Session Note  Patient Details  Name: Isaac Harrison MRN: [id number] Date of Birth: [birth year] Referring Provider:   Flowsheet Row Pulmonary Rehab from 05/24/2021 in [facility] Cardiac and Pulmonary Rehab  Referring Provider [name] [facility])       Encounter Date: 08/30/2021  Check In:  Session Check In - 08/30/21 0914       Check-In   Supervising physician immediately available to respond to emergencies See telemetry face sheet for immediately available ER MD    Location [facility]-Cardiac & Pulmonary Rehab    Staff Present [name], MPA, Elveria Rising, BA, ACSM CEP, Exercise Physiologist;Kalee Broxton Amedeo Plenty, BS, ACSM CEP, Exercise Physiologist    Virtual Visit No    Medication changes reported     No    Fall or balance concerns reported    No    Warm-up and Cool-down Performed on first and last piece of equipment    Resistance Training Performed Yes    VAD Patient? No    PAD/SET Patient? No      Pain Assessment   Currently in Pain? No/denies                Social History   Tobacco Use  Smoking Status Former   Packs/day: 1.50   Years: 35.00   Pack years: 52.50   Types: Cigarettes   Quit date: 03/09/2021   Years since quitting: 0.4  Smokeless Tobacco Never  Tobacco Comments   Quit   not smoking any at all    Goals Met:  Independence with exercise equipment Exercise tolerated well No report of concerns or symptoms today Strength training completed today  Goals Unmet:  Not Applicable  Comments: Pt able to follow exercise prescription today without complaint.  Will continue to monitor for progression.    Dr. Emily Filbert is Medical Director for [place].  Dr. Ottie Glazier is Medical Director for [facility] Pulmonary Rehabilitation.

## 2021-09-04 ENCOUNTER — Encounter: Payer: No Typology Code available for payment source | Admitting: *Deleted

## 2021-09-04 ENCOUNTER — Other Ambulatory Visit: Payer: Self-pay

## 2021-09-04 DIAGNOSIS — J449 Chronic obstructive pulmonary disease, unspecified: Secondary | ICD-10-CM

## 2021-09-04 NOTE — Progress Notes (Signed)
Daily Session Note  Patient Details  Name: Isaac Harrison MRN: [id number] Date of Birth: 27-Jul-1956 Referring Provider:   Flowsheet Row Pulmonary Rehab from 05/24/2021 in [facility] Cardiac and Pulmonary Rehab  Referring Provider [name] [facility])       Encounter Date: 09/04/2021  Check In:  Session Check In - 09/04/21 1010       Check-In   Supervising physician immediately available to respond to emergencies See telemetry face sheet for immediately available ER MD    Location [facility]-Cardiac & Pulmonary Rehab    Staff Present Heath Lark, RN, BSN, CCRP;Laureen Owens Shark, BS, RRT, CPFT;Amanda Oletta Darter, BA, ACSM CEP, Exercise Physiologist    Virtual Visit No    Medication changes reported     No    Fall or balance concerns reported    No    Warm-up and Cool-down Performed on first and last piece of equipment    Resistance Training Performed Yes    VAD Patient? No    PAD/SET Patient? No      Pain Assessment   Currently in Pain? No/denies                Social History   Tobacco Use  Smoking Status Former   Packs/day: 1.50   Years: 35.00   Pack years: 52.50   Types: Cigarettes   Quit date: 03/09/2021   Years since quitting: 0.4  Smokeless Tobacco Never  Tobacco Comments   Quit   not smoking any at all    Goals Met:  Proper associated with RPD/PD & O2 Sat Independence with exercise equipment Exercise tolerated well No report of concerns or symptoms today  Goals Unmet:  Not Applicable  Comments: Pt able to follow exercise prescription today without complaint.  Will continue to monitor for progression.    Dr. Emily Filbert is Medical Director for [place].  Dr. Ottie Glazier is Medical Director for [facility] Pulmonary Rehabilitation.

## 2021-09-05 ENCOUNTER — Encounter: Payer: Self-pay | Admitting: *Deleted

## 2021-09-05 DIAGNOSIS — J449 Chronic obstructive pulmonary disease, unspecified: Secondary | ICD-10-CM

## 2021-09-05 NOTE — Progress Notes (Signed)
Pulmonary Individual Treatment Plan  Patient Details  Name: Isaac Harrison MRN: [id number] Date of Birth: Feb 04, 1956 Referring Provider:   Flowsheet Row Pulmonary Rehab from 05/24/2021 in [facility] Cardiac and Pulmonary Rehab  Referring Provider [name] [facility])       Initial Encounter Date:  Flowsheet Row Pulmonary Rehab from 05/24/2021 in [facility] Cardiac and Pulmonary Rehab  Date 05/24/21       Visit Diagnosis: Chronic obstructive pulmonary disease, unspecified COPD type ([place])  Patient's Home Medications on Admission:  Current Outpatient Medications:    albuterol (PROVENTIL HFA;VENTOLIN HFA) 108 (90 Base) MCG/ACT inhaler, Inhale 2 puffs into the lungs every 6 (six) hours as needed for wheezing or shortness of breath., Disp: 1 Inhaler, Rfl: 0   albuterol (PROVENTIL) (2.5 MG/3ML) 0.083% nebulizer solution, USE 1 AMPULE BY ORAL INHALATION FOUR TIMES A DAY AS NEEDED FOR BREATHING / SHORTNESS OF BREATH, Disp: , Rfl:    amLODipine (NORVASC) 10 MG tablet, Take by mouth., Disp: , Rfl:    azithromycin (ZITHROMAX Z-PAK) 250 MG tablet, Take 2 tablets (500 mg) on  Day 1,  followed by 1 tablet (250 mg) once daily on Days 2 through 5. (Patient not taking: Reported on 04/07/2018), Disp: 6 each, Rfl: 0   Budeson-Glycopyrrol-Formoterol (BREZTRI AEROSPHERE) 160-9-4.8 MCG/ACT AERO, Inhale 2 Inhalers into the lungs 2 (two) times daily., Disp: , Rfl:    fluticasone (FLONASE) 50 MCG/ACT nasal spray, INSTILL 2 SPRAYS INTO EACH NOSTRIL ONCE EVERY DAY MAXIMUM 2 SPRAYS IN EACH NOSTRIL DAILY. FOR ALLERGIES, Disp: , Rfl:    guaiFENesin (MUCINEX) 600 MG 12 hr tablet, Take 1 tablet by mouth 2 (two) times daily., Disp: , Rfl:    predniSONE (DELTASONE) 20 MG tablet, Take 3 tablets (60 mg total) by mouth daily., Disp: 12 tablet, Rfl: 0   predniSONE (DELTASONE) 20 MG tablet, Take 3 tablets (60 mg total) by mouth daily. (Patient not taking: Reported on 05/18/2021), Disp: 12 tablet, Rfl: 0   Spacer/Aero Chamber Mouthpiece  MISC, 1 Units by Does not apply route every 4 (four) hours as needed (wheezing). (Patient not taking: Reported on 04/07/2018), Disp: 1 each, Rfl: 0  Past Medical History: Past Medical History:  Diagnosis Date   COPD (chronic obstructive pulmonary disease) ([place])     Tobacco Use: Social History   Tobacco Use  Smoking Status Former   Packs/day: 1.50   Years: 35.00   Pack years: 52.50   Types: Cigarettes   Quit date: 03/09/2021   Years since quitting: 0.4  Smokeless Tobacco Never  Tobacco Comments   Quit   not smoking any at all    Labs: Recent Review Flowsheet Data     Labs for ITP Cardiac and Pulmonary Rehab Latest Ref Rng & Units 04/07/2018   HCO3 20.0 - 28.0 mmol/L 27.1   ACIDBASEDEF 0.0 - 2.0 mmol/L 0.6        Pulmonary Assessment Scores:  Pulmonary Assessment Scores     Row Name 05/24/21 1154         ADL UCSD   ADL Phase Entry     SOB Score total 52     Rest 1     Walk 3     Stairs 4     Bath 3     Dress 3     Shop 3       CAT Score   CAT Score 29       mMRC Score   mMRC Score 2.5  UCSD: Self-administered rating of dyspnea associated with activities of daily living (ADLs) 6-point scale (0 = "not at all" to 5 = "maximal or unable to do because of breathlessness")  Scoring Scores range from 0 to 120.  Minimally important difference is 5 units  CAT: CAT can identify the health impairment of COPD patients and is better correlated with disease progression.  CAT has a scoring range of zero to 40. The CAT score is classified into four groups of low (less than 10), medium (10 - 20), high (21-30) and very high (31-40) based on the impact level of disease on health status. A CAT score over 10 suggests significant symptoms.  A worsening CAT score could be explained by an exacerbation, poor medication adherence, poor inhaler technique, or progression of COPD or comorbid conditions.  CAT MCID is 2 points  mMRC: mMRC (Modified Medical Research  Council) Dyspnea Scale is used to assess the degree of baseline functional disability in patients of respiratory disease due to dyspnea. No minimal important difference is established. A decrease in score of 1 point or greater is considered a positive change.   Pulmonary Function Assessment:   Exercise Target Goals: Exercise Program Goal: Individual exercise prescription set using results from initial 6 min walk test and THRR while considering  patients activity barriers and safety.   Exercise Prescription Goal: Initial exercise prescription builds to 30-45 minutes a day of aerobic activity, 2-3 days per week.  Home exercise guidelines will be given to patient during program as part of exercise prescription that the participant will acknowledge.  Education: Aerobic Exercise: - Group verbal and visual presentation on the components of exercise prescription. Introduces F.I.T.T principle from ACSM for exercise prescriptions.  Reviews F.I.T.T. principles of aerobic exercise including progression. Written material given at graduation. Flowsheet Row Pulmonary Rehab from 08/30/2021 in [facility] Cardiac and Pulmonary Rehab  Date 07/05/21  Educator [facility]  Instruction Review Code 1- Verbalizes Understanding       Education: Resistance Exercise: - Group verbal and visual presentation on the components of exercise prescription. Introduces F.I.T.T principle from ACSM for exercise prescriptions  Reviews F.I.T.T. principles of resistance exercise including progression. Written material given at graduation.    Education: Exercise & Equipment Safety: - Individual verbal instruction and demonstration of equipment use and safety with use of the equipment. Flowsheet Row Pulmonary Rehab from 08/30/2021 in [facility] Cardiac and Pulmonary Rehab  Education need identified 05/24/21  Date 05/24/21  Educator [place]  Instruction Review Code 1- Verbalizes Understanding       Education: Exercise Physiology & General  Exercise Guidelines: - Group verbal and written instruction with models to review the exercise physiology of the cardiovascular system and associated critical values. Provides general exercise guidelines with specific guidelines to those with heart or lung disease.  Flowsheet Row Pulmonary Rehab from 08/30/2021 in [facility] Cardiac and Pulmonary Rehab  Date 06/28/21  Educator [facility]  Instruction Review Code 1- Verbalizes Understanding       Education: Flexibility, Balance, Mind/Body Relaxation: - Group verbal and visual presentation with interactive activity on the components of exercise prescription. Introduces F.I.T.T principle from ACSM for exercise prescriptions. Reviews F.I.T.T. principles of flexibility and balance exercise training including progression. Also discusses the mind body connection.  Reviews various relaxation techniques to help reduce and manage stress (i.e. Deep breathing, progressive muscle relaxation, and visualization). Balance handout provided to take home. Written material given at graduation. Flowsheet Row Pulmonary Rehab from 08/30/2021 in [facility] Cardiac and Pulmonary Rehab  Date 07/19/21  Educator [place]  Instruction Review Code 1- Verbalizes Understanding       Activity Barriers & Risk Stratification:  Activity Barriers & Cardiac Risk Stratification - 05/24/21 1202       Activity Barriers & Cardiac Risk Stratification   Activity Barriers Shortness of Breath;Deconditioning             6 Minute Walk:  6 Minute Walk     Row Name 05/24/21 1408         6 Minute Walk   Phase Initial     Distance 975 feet     Walk Time 6 minutes     # of Rest Breaks 0     MPH 1.84     METS 3.02     RPE 11     Perceived Dyspnea  2     VO2 Peak 10.57     Symptoms Yes (comment)     Comments SOB     Resting HR 71 bpm     Resting BP 144/74     Resting Oxygen Saturation  95 %     Exercise Oxygen Saturation  during 6 min walk 89 %     Max Ex. HR 100 bpm     Max Ex. BP 164/76      2 Minute Post BP 144/74       Interval HR   1 Minute HR 92     2 Minute HR 93     3 Minute HR 95     4 Minute HR 100     5 Minute HR 97     6 Minute HR 98     2 Minute Post HR 78     Interval Heart Rate? Yes       Interval Oxygen   Interval Oxygen? Yes     Baseline Oxygen Saturation % 95 %     1 Minute Oxygen Saturation % 92 %     1 Minute Liters of Oxygen 0 L  RA     2 Minute Oxygen Saturation % 93 %     2 Minute Liters of Oxygen 0 L     3 Minute Oxygen Saturation % 91 %     3 Minute Liters of Oxygen 0 L     4 Minute Oxygen Saturation % 89 %     4 Minute Liters of Oxygen 0 L     5 Minute Oxygen Saturation % 90 %     5 Minute Liters of Oxygen 0 L     6 Minute Oxygen Saturation % 89 %     6 Minute Liters of Oxygen 0 L     2 Minute Post Oxygen Saturation % 96 %     2 Minute Post Liters of Oxygen 0 L             Oxygen Initial Assessment:  Oxygen Initial Assessment - 05/24/21 1154       Home Oxygen   Home Oxygen Device None    Sleep Oxygen Prescription None    Home Exercise Oxygen Prescription None    Home Resting Oxygen Prescription None      Initial 6 min Walk   Oxygen Used None      Program Oxygen Prescription   Program Oxygen Prescription None      Intervention   Short Term Goals To learn and demonstrate proper pursed lip breathing techniques or other breathing techniques. ;To learn and understand importance of maintaining oxygen saturations>88%;To learn  and demonstrate proper use of respiratory medications;To learn and understand importance of monitoring SPO2 with pulse oximeter and demonstrate accurate use of the pulse oximeter.    Long  Term Goals Verbalizes importance of monitoring SPO2 with pulse oximeter and return demonstration;Maintenance of O2 saturations>88%;Exhibits proper breathing techniques, such as pursed lip breathing or other method taught during program session;Compliance with respiratory medication;Demonstrates proper use of MDIs              Oxygen Re-Evaluation:  Oxygen Re-Evaluation     Row Name 05/29/21 0925 06/19/21 1023 07/19/21 0944 08/28/21 0955       Program Oxygen Prescription   Program Oxygen Prescription None None None None      Home Oxygen   Home Oxygen Device None None None None    Sleep Oxygen Prescription None None None None    Home Exercise Oxygen Prescription None None None None    Home Resting Oxygen Prescription None None None None    Compliance with Home Oxygen Use -- Yes Yes Yes      Goals/Expected Outcomes   Short Term Goals To learn and demonstrate proper pursed lip breathing techniques or other breathing techniques. ;To learn and understand importance of maintaining oxygen saturations>88%;To learn and understand importance of monitoring SPO2 with pulse oximeter and demonstrate accurate use of the pulse oximeter. To learn and demonstrate proper pursed lip breathing techniques or other breathing techniques. ;To learn and understand importance of maintaining oxygen saturations>88%;To learn and understand importance of monitoring SPO2 with pulse oximeter and demonstrate accurate use of the pulse oximeter. To learn and demonstrate proper pursed lip breathing techniques or other breathing techniques. ;To learn and understand importance of maintaining oxygen saturations>88%;To learn and understand importance of monitoring SPO2 with pulse oximeter and demonstrate accurate use of the pulse oximeter. To learn and demonstrate proper pursed lip breathing techniques or other breathing techniques. ;To learn and understand importance of maintaining oxygen saturations>88%;To learn and understand importance of monitoring SPO2 with pulse oximeter and demonstrate accurate use of the pulse oximeter.    Long  Term Goals Verbalizes importance of monitoring SPO2 with pulse oximeter and return demonstration;Maintenance of O2 saturations>88%;Exhibits proper breathing techniques, such as pursed lip breathing or other method  taught during program session;Compliance with respiratory medication Verbalizes importance of monitoring SPO2 with pulse oximeter and return demonstration;Maintenance of O2 saturations>88%;Exhibits proper breathing techniques, such as pursed lip breathing or other method taught during program session;Compliance with respiratory medication Verbalizes importance of monitoring SPO2 with pulse oximeter and return demonstration;Maintenance of O2 saturations>88%;Exhibits proper breathing techniques, such as pursed lip breathing or other method taught during program session;Compliance with respiratory medication Verbalizes importance of monitoring SPO2 with pulse oximeter and return demonstration;Maintenance of O2 saturations>88%;Exhibits proper breathing techniques, such as pursed lip breathing or other method taught during program session;Compliance with respiratory medication    Comments Reviewed PLB technique with pt.  Talked about how it works and it's importance in maintaining their exercise saturations. Isaac Harrison uses PLB as needed and takes medication as directed. Isaac Harrison is doing well with using PLB at home and doesnt have any questions at this time. Isaac Harrison is doing better with his breathing. He is good with PLB and monitoring his sats.    Goals/Expected Outcomes Short: Become more profiecient at using PLB.   Long: Become independent at using PLB. Short: continue to use PLB Long: be able to do ADLS without shortness of breath Short:  continue to use PLB when needed Long: maintain/improve ability to do  ADLs without rest Short:  continue to use PLB when needed Long: maintain/improve ability to do ADLs without rest             Oxygen Discharge (Final Oxygen Re-Evaluation):  Oxygen Re-Evaluation - 08/28/21 0955       Program Oxygen Prescription   Program Oxygen Prescription None      Home Oxygen   Home Oxygen Device None    Sleep Oxygen Prescription None    Home Exercise Oxygen Prescription None    Home  Resting Oxygen Prescription None    Compliance with Home Oxygen Use Yes      Goals/Expected Outcomes   Short Term Goals To learn and demonstrate proper pursed lip breathing techniques or other breathing techniques. ;To learn and understand importance of maintaining oxygen saturations>88%;To learn and understand importance of monitoring SPO2 with pulse oximeter and demonstrate accurate use of the pulse oximeter.    Long  Term Goals Verbalizes importance of monitoring SPO2 with pulse oximeter and return demonstration;Maintenance of O2 saturations>88%;Exhibits proper breathing techniques, such as pursed lip breathing or other method taught during program session;Compliance with respiratory medication    Comments Isaac Harrison is doing better with his breathing. He is good with PLB and monitoring his sats.    Goals/Expected Outcomes Short:  continue to use PLB when needed Long: maintain/improve ability to do ADLs without rest             Initial Exercise Prescription:  Initial Exercise Prescription - 05/24/21 1400       Date of Initial Exercise RX and Referring Provider   Date 05/24/21    Referring Provider [name] ([place])      Recumbant Bike   Level 1    RPM 60    Watts 25    Minutes 15    METs 3      REL-XR   Level 1    Speed 50    Minutes 15    METs 3      T5 Nustep   Level 1    SPM 80    Minutes 15    METs 3      Track   Laps 17    Minutes 15    METs 1.92      Prescription Details   Frequency (times per week) 2    Duration Progress to 30 minutes of continuous aerobic without signs/symptoms of physical distress      Intensity   THRR 40-80% of Max Heartrate 103-136    Ratings of Perceived Exertion 11-13    Perceived Dyspnea 0-4      Progression   Progression Continue to progress workloads to maintain intensity without signs/symptoms of physical distress.      Resistance Training   Training Prescription Yes    Weight 3 lb    Reps 10-15              Perform Capillary Blood Glucose checks as needed.  Exercise Prescription Changes:   Exercise Prescription Changes     Row Name 05/24/21 1400 06/05/21 0700 06/18/21 1300 07/02/21 1000 07/16/21 1300     Response to Exercise   Blood Pressure (Admit) 144/74 122/64 128/64 120/60 126/72   Blood Pressure (Exercise) 164/76 140/70 158/64 134/70 --   Blood Pressure (Exit) 144/74 112/62 122/70 112/62 112/60   Heart Rate (Admit) 71 bpm 93 bpm 89 bpm 84 bpm 69 bpm   Heart Rate (Exercise) 100 bpm 119 bpm 104 bpm 103 bpm 106 bpm  Heart Rate (Exit) 72 bpm 98 bpm 92 bpm 82 bpm 83 bpm   Oxygen Saturation (Admit) 95 % 95 % 92 % 96 % 94 %   Oxygen Saturation (Exercise) 89 % 93 % 93 % 93 % 90 %   Oxygen Saturation (Exit) 96 % 95 % 95 % 94 % 93 %   Rating of Perceived Exertion (Exercise) _0 Perceived Dyspnea (Exercise) _1 Symptoms SOB SOB -- SOB SOB   Comments walk test results second full day of exercise -- -- --   Duration -- Continue with 30 min of aerobic exercise without signs/symptoms of physical distress. Continue with 30 min of aerobic exercise without signs/symptoms of physical distress. Continue with 30 min of aerobic exercise without signs/symptoms of physical distress. Continue with 30 min of aerobic exercise without signs/symptoms of physical distress.   Intensity -- THRR unchanged THRR unchanged THRR unchanged THRR unchanged     Progression   Progression -- Continue to progress workloads to maintain intensity without signs/symptoms of physical distress. Continue to progress workloads to maintain intensity without signs/symptoms of physical distress. Continue to progress workloads to maintain intensity without signs/symptoms of physical distress. Continue to progress workloads to maintain intensity without signs/symptoms of physical distress.   Average METs -- 2.56 3.13 3.06 2.45     Resistance Training   Training Prescription -- Yes Yes Yes Yes   Weight -- 3 lb 3  lb 3 lb 3 lb   Reps -- 10-15 10-15 10-15 10-15     Interval Training   Interval Training -- No No No No     Recumbant Bike   Level -- 1 -- -- --   Watts -- 15 -- -- --   Minutes -- 15 -- -- --   METs -- 2.59 -- -- --     REL-XR   Level -- _2 --   Minutes -- _3 --   METs -- 3.6 4 4.4 --     T5 Nustep   Level -- 1 -- 3 3   Minutes -- 15 -- 15 15   METs -- 1.8 -- -- --     Track   Laps -- _4 Minutes -- _5 METs -- 2.25 2.25 2.36 2.36     Oxygen   Maintain Oxygen Saturation -- 88% or higher 88% or higher 88% or higher 88% or higher    Row Name 07/30/21 1300 08/13/21 0800           Response to Exercise   Blood Pressure (Admit) 138/68 126/72      Blood Pressure (Exit) 112/68 122/62      Heart Rate (Admit) 97 bpm 67 bpm      Heart Rate (Exercise) 115 bpm 101 bpm      Heart Rate (Exit) 97 bpm 83 bpm      Oxygen Saturation (Admit) 94 % 95 %      Oxygen Saturation (Exercise) 91 % 91 %      Oxygen Saturation (Exit) 94 % 95 %      Rating of Perceived Exertion (Exercise) 12 13      Perceived Dyspnea (Exercise) 3 3      Symptoms SOB SOB      Duration Continue with 30 min of aerobic exercise without signs/symptoms of physical distress. Continue with 30 min of aerobic exercise without signs/symptoms  of physical distress.      Intensity THRR unchanged THRR unchanged        Progression   Progression Continue to progress workloads to maintain intensity without signs/symptoms of physical distress. Continue to progress workloads to maintain intensity without signs/symptoms of physical distress.      Average METs 2.57 2.26        Resistance Training   Training Prescription Yes Yes      Weight 4 lb 4 lb      Reps 10-15 10-15        Interval Training   Interval Training No No        REL-XR   Level 3 --      Minutes 15 --      METs 3.1 --        T5 Nustep   Level 3 3      Minutes 15 15      METs 2 1.9        Track   Laps 30 30       Minutes 15 15      METs 2.63 2.63        Home Exercise Plan   Plans to continue exercise at Home (comment)  walking Home (comment)  walking      Frequency Add 2 additional days to program exercise sessions. Add 2 additional days to program exercise sessions.      Initial Home Exercises Provided 06/19/21 06/19/21        Oxygen   Maintain Oxygen Saturation 88% or higher 88% or higher               Exercise Comments:   Exercise Comments     Row Name 05/29/21 3329           Exercise Comments First full day of exercise!  Patient was oriented to gym and equipment including functions, settings, policies, and procedures.  Patient's individual exercise prescription and treatment plan were reviewed.  All starting workloads were established based on the results of the 6 minute walk test done at initial orientation visit.  The plan for exercise progression was also introduced and progression will be customized based on patient's performance and goals.                Exercise Goals and Review:   Exercise Goals     Row Name 05/24/21 1410             Exercise Goals   Increase Physical Activity Yes       Intervention Provide advice, education, support and counseling about physical activity/exercise needs.;Develop an individualized exercise prescription for aerobic and resistive training based on initial evaluation findings, risk stratification, comorbidities and participant's personal goals.       Expected Outcomes Short Term: Attend rehab on a regular basis to increase amount of physical activity.;Long Term: Add in home exercise to make exercise part of routine and to increase amount of physical activity.;Long Term: Exercising regularly at least 3-5 days a week.       Increase Strength and Stamina Yes       Intervention Provide advice, education, support and counseling about physical activity/exercise needs.;Develop an individualized exercise prescription for aerobic and resistive  training based on initial evaluation findings, risk stratification, comorbidities and participant's personal goals.       Expected Outcomes Short Term: Increase workloads from initial exercise prescription for resistance, speed, and METs.;Short Term: Perform resistance training exercises routinely during rehab and add  in resistance training at home;Long Term: Improve cardiorespiratory fitness, muscular endurance and strength as measured by increased METs and functional capacity (6MWT)       Able to understand and use rate of perceived exertion (RPE) scale Yes       Intervention Provide education and explanation on how to use RPE scale       Expected Outcomes Short Term: Able to use RPE daily in rehab to express subjective intensity level;Long Term:  Able to use RPE to guide intensity level when exercising independently       Able to understand and use Dyspnea scale Yes       Intervention Provide education and explanation on how to use Dyspnea scale       Expected Outcomes Short Term: Able to use Dyspnea scale daily in rehab to express subjective sense of shortness of breath during exertion;Long Term: Able to use Dyspnea scale to guide intensity level when exercising independently       Knowledge and understanding of Target Heart Rate Range (THRR) Yes       Intervention Provide education and explanation of THRR including how the numbers were predicted and where they are located for reference       Expected Outcomes Long Term: Able to use THRR to govern intensity when exercising independently;Short Term: Able to state/look up THRR;Short Term: Able to use daily as guideline for intensity in rehab       Able to check pulse independently Yes       Intervention Provide education and demonstration on how to check pulse in carotid and radial arteries.;Review the importance of being able to check your own pulse for safety during independent exercise       Expected Outcomes Short Term: Able to explain why pulse  checking is important during independent exercise;Long Term: Able to check pulse independently and accurately       Understanding of Exercise Prescription Yes       Intervention Provide education, explanation, and written materials on patient's individual exercise prescription       Expected Outcomes Short Term: Able to explain program exercise prescription;Long Term: Able to explain home exercise prescription to exercise independently                Exercise Goals Re-Evaluation :  Exercise Goals Re-Evaluation     Row Name 05/29/21 0925 06/05/21 0749 06/18/21 1352 06/19/21 1022 07/02/21 1103     Exercise Goal Re-Evaluation   Exercise Goals Review Increase Physical Activity;Able to understand and use rate of perceived exertion (RPE) scale;Knowledge and understanding of Target Heart Rate Range (THRR);Understanding of Exercise Prescription;Increase Strength and Stamina;Able to understand and use Dyspnea scale;Able to check pulse independently Increase Physical Activity;Increase Strength and Stamina;Understanding of Exercise Prescription Increase Physical Activity;Increase Strength and Stamina Increase Physical Activity;Increase Strength and Stamina;Able to understand and use rate of perceived exertion (RPE) scale;Knowledge and understanding of Target Heart Rate Range (THRR);Able to check pulse independently Increase Physical Activity;Increase Strength and Stamina   Comments Reviewed RPE and dyspnea scales, THR and program prescription with pt today.  Pt voiced understanding and was given a copy of goals to take home. Isaac Harrison is off to a good start in rehab.  He is already at 23 laps on the track on his third visit.  We will continue to monitor his progress. Isaac Harrison is doing well so far.  Staff will encourag etrying 4 lb for strength work. Reviewed home exercise with pt today.  Pt plans to walk  and consider [employer] for exercise.  Reviewed THR, pulse, RPE, sign and symptoms, pulse oximetery and when to  call 911 or MD.  Also discussed weather considerations and indoor options.  Pt voiced understanding. Isaac Harrison is continuing to do well in rehab. He has increased to level 3 on the T5 Nustep and completed 25 laps on the track which is the most thus far. He should be encouraged to increase to 4 lbs for his handweights. Will continue to monitor   Expected Outcomes Short: Use RPE daily to regulate intensity. Long: Follow program prescription in THR. Short: Continue to attend rehab regularly Long: Continue to follow program prescription Short: try 4 lb Long: build overall stamina Short: add one day per week in addition to program sessions  Long:exercise 3-5 days per week Short: Continue to increase loads on the track and increase handweights to 4 lbs Long: Continue to increase overall MET level    Row Name 07/16/21 1332 07/19/21 0951 07/30/21 1339 08/13/21 0842 08/27/21 1028     Exercise Goal Re-Evaluation   Exercise Goals Review Increase Physical Activity;Increase Strength and Stamina Increase Physical Activity;Increase Strength and Stamina Increase Physical Activity;Increase Strength and Stamina;Understanding of Exercise Prescription Increase Physical Activity;Increase Strength and Stamina Increase Physical Activity;Increase Strength and Stamina   Comments Isaac Harrison has worked up to 30 laps on the track. Oxygen sats have been in the 90s.  He has reached lower end of THR range  .He still needs to try 4 lb weights. Isaac Harrison ha smoved up to [employer].  He walked first today and alternates which he does first for variety. Isaac Harrison is doing well in rehab.  He is getting in 30 laps on the track.  We will encourage him to try 5 lb weights.  We will continue to monitor his progress. Isaac Harrison attends consistently and reaches THR range most sessions.  He still needs to try 5 lb weights.  Staff will monitor progress. Isaac Harrison has not been to rehab for the last couple of weeks due to having surgery and then getting sick. He should be due back  soon and maintain consistent attendance.   Expected Outcomes Short: try 4 lb weights Long: improve overall stamina Short: continue to increase workloads as tolerated Long: increase average MET level Short: Try 5 lb weights Long: Continue to improve stamina Short:  move up to 5 lb weights Long: improve overall stamina Short: Resume consistent attendance Long: Continue to increase overall MET level    Row Name 08/28/21 0935             Exercise Goal Re-Evaluation   Exercise Goals Review Increase Physical Activity;Increase Strength and Stamina;Understanding of Exercise Prescription       Comments Isaac Harrison returned to rehab today after surgery.  He is feeling better and thinks he got the treatment as he is feeling better.  He has been walking some at home, but plans to get back to it more again now that he is feeling better. His stamina has gotten better since starting the program.       Expected Outcomes Short: Return to consistent exericse Long: Continue to improve stamina                Discharge Exercise Prescription (Final Exercise Prescription Changes):  Exercise Prescription Changes - 08/13/21 0800       Response to Exercise   Blood Pressure (Admit) 126/72    Blood Pressure (Exit) 122/62    Heart Rate (Admit) 67 bpm  Heart Rate (Exercise) 101 bpm    Heart Rate (Exit) 83 bpm    Oxygen Saturation (Admit) 95 %    Oxygen Saturation (Exercise) 91 %    Oxygen Saturation (Exit) 95 %    Rating of Perceived Exertion (Exercise) 13    Perceived Dyspnea (Exercise) 3    Symptoms SOB    Duration Continue with 30 min of aerobic exercise without signs/symptoms of physical distress.    Intensity THRR unchanged      Progression   Progression Continue to progress workloads to maintain intensity without signs/symptoms of physical distress.    Average METs 2.26      Resistance Training   Training Prescription Yes    Weight 4 lb    Reps 10-15      Interval Training   Interval Training No       T5 Nustep   Level 3    Minutes 15    METs 1.9      Track   Laps 30    Minutes 15    METs 2.63      Home Exercise Plan   Plans to continue exercise at Home (comment)   walking   Frequency Add 2 additional days to program exercise sessions.    Initial Home Exercises Provided 06/19/21      Oxygen   Maintain Oxygen Saturation 88% or higher             Nutrition:  Target Goals: Understanding of nutrition guidelines, daily intake of sodium <1579m, cholesterol <2035m calories 30% from fat and 7% or less from saturated fats, daily to have 5 or more servings of fruits and vegetables.  Education: All About Nutrition: -Group instruction provided by verbal, written material, interactive activities, discussions, models, and posters to present general guidelines for heart healthy nutrition including fat, fiber, MyPlate, the role of sodium in heart healthy nutrition, utilization of the nutrition label, and utilization of this knowledge for meal planning. Follow up email sent as well. Written material given at graduation. Flowsheet [facility]  Date 05/31/21  Educator [facility]ruction Review Code 1- Verbalizes Understanding       Biometrics:  Pre Biometrics - 05/24/21 1201       Pre Biometrics   Height 5' 11.5" (1.816 m)    Weight 176 lb 14.4 oz (80.2 kg)    BMI (Calculated) 24.33    Single Leg Stand 30 seconds              Nutrition Therapy Plan and Nutrition Goals:  Nutrition Therapy & Goals - 06/26/21 1018       Nutrition Therapy   Diet Heart healthy, low Na, pulmonary MNT    Protein (specify units) 95g    Fiber 30 grams    Whole Grain Foods 3 servings    Saturated Fats 12 max. grams    Fruits and Vegetables 8 servings/day    Sodium 1.5 grams      Personal Nutrition Goals   Nutrition Goal ST: have 1 good protein source at each meal/snack LT: limit Na <1.5g, meet protein and calorie needs  consistently    Comments He has surgeries on his lungs in November - it is experimental and he may be a part of the placebo group. He feels short of breath after cooking. He has small frequent meals (4-6 meals) and has some shortness of breath. B: biscuit with country ham L: pot pie D: shrimp  or fish 1x/week, beef stew, pork with rice and potatoes and salads and other green vegetables. He gets a good variety of vegetables. He adds spices and not much salt, bacon grease sometimes, olive oil mostly with cooking. He enjoys cheese and eggs as well, he has peanuts as snack with sundrop. Drinks: 1 gallon of milk every two day, sundrop, sometimes rum and coke. Discussed heart healthy eating and pulmonary MNT.      Intervention Plan   Intervention Prescribe, educate and counsel regarding individualized specific dietary modifications aiming towards targeted core components such as weight, hypertension, lipid management, diabetes, heart failure and other comorbidities.    Expected Outcomes Short Term Goal: Understand basic principles of dietary content, such as calories, fat, sodium, cholesterol and nutrients.;Short Term Goal: A plan has been developed with personal nutrition goals set during dietitian appointment.;Long Term Goal: Adherence to prescribed nutrition plan.             Nutrition Assessments:  MEDIFICTS Score Key: ?70 Need to make dietary changes  40-70 Heart Healthy Diet ? 40 Therapeutic Level Cholesterol Diet  Flowsheet Row Pulmonary Rehab from 05/24/2021 in [facility] Cardiac and Pulmonary Rehab  Picture Your Plate Total Score on Admission 54      Picture Your Plate Scores: <02 Unhealthy dietary pattern with much room for improvement. 41-50 Dietary pattern unlikely to meet recommendations for good health and room for improvement. 51-60 More healthful dietary pattern, with some room for improvement.  >60 Healthy dietary pattern, although there may be some specific behaviors that could be  improved.   Nutrition Goals Re-Evaluation:  Nutrition Goals Re-Evaluation     [place] Name 07/19/21 0941 08/28/21 0939           Goals   Nutrition Goal -- ST: have 1 good protein source at each meal/snack LT: limit Na <1.5g, meet protein and calorie needs consistently      Comment Isaac Harrison has cut back on red meat and eats more fish.  He does eat green vegetables.  He is using frozen vegetables more than canned to reduce sodium. Isaac Harrison is doing well with his diet.  His wife has gotten back to cooking again.  They are getting in lots of fruits and vegetables and cutting back on sodium.  They are eating more fish than ever before.  He is doing well with the holiday goodies as he does not have a big sweet tooth.      Expected Outcome ST/LT:  continue heart healthy diet Short: Continue to watch sodium Long: Continue to get in a variety of food               Nutrition Goals Discharge (Final Nutrition Goals Re-Evaluation):  Nutrition Goals Re-Evaluation - 08/28/21 0939       Goals   Nutrition Goal ST: have 1 good protein source at each meal/snack LT: limit Na <1.5g, meet protein and calorie needs consistently    Comment Isaac Harrison is doing well with his diet.  His wife has gotten back to cooking again.  They are getting in lots of fruits and vegetables and cutting back on sodium.  They are eating more fish than ever before.  He is doing well with the holiday goodies as he does not have a big sweet tooth.    Expected Outcome Short: Continue to watch sodium Long: Continue to get in a variety of food             Psychosocial: Target Goals: Acknowledge presence or absence  of significant depression and/or stress, maximize coping skills, provide positive support system. Participant is able to verbalize types and ability to use techniques and skills needed for reducing stress and depression.   Education: Stress, Anxiety, and Depression - Group verbal and visual presentation to define topics covered.   Reviews how body is impacted by stress, anxiety, and depression.  Also discusses healthy ways to reduce stress and to treat/manage anxiety and depression.  Written material given at graduation. Flowsheet Row Pulmonary Rehab from 08/30/2021 in [facility] Cardiac and Pulmonary Rehab  Date 06/21/21  Educator [facility]  Instruction Review Code 1- [employer] Understanding       Education: Sleep Hygiene -Provides group verbal and written instruction about how sleep can affect your health.  Define sleep hygiene, discuss sleep cycles and impact of sleep habits. Review good sleep hygiene tips.    Initial Review & Psychosocial Screening:  Initial Psych Review & Screening - 05/18/21 1025       Initial Review   Current issues with None Identified      Family Dynamics   Good Support System? Yes   wife, few friends     Barriers   Psychosocial barriers to participate in program There are no identifiable barriers or psychosocial needs.;The patient should benefit from training in stress management and relaxation.      Screening Interventions   Interventions Encouraged to exercise;Provide feedback about the scores to participant;To provide support and resources with identified psychosocial needs    Expected Outcomes Short Term goal: Utilizing psychosocial counselor, staff and physician to assist with identification of specific Stressors or current issues interfering with healing process. Setting desired goal for each stressor or current issue identified.;Long Term Goal: Stressors or current issues are controlled or eliminated.;Short Term goal: Identification and review with participant of any Quality of Life or Depression concerns found by scoring the questionnaire.;Long Term goal: The participant improves quality of Life and PHQ9 Scores as seen by post scores and/or verbalization of changes             Quality of Life Scores:  Scores of 19 and below usually indicate a poorer quality of life in these areas.   A difference of  2-3 points is a clinically meaningful difference.  A difference of 2-3 points in the total score of the Quality of Life Index has been associated with significant improvement in overall quality of life, self-image, physical symptoms, and general health in studies assessing change in quality of life.  PHQ-9: Recent Review Flowsheet Data     Depression screen [facility] 2/9 06/21/2021 05/24/2021   Decreased Interest 1 1   Down, Depressed, Hopeless 0 0   PHQ - 2 Score 1 1   Altered sleeping 0 1   Tired, decreased energy 1 2   Change in appetite 0 0   Feeling bad or failure about yourself  0 0   Trouble concentrating 0 0   Moving slowly or fidgety/restless 1 1   Suicidal thoughts 0 0   PHQ-9 Score 3 5   Difficult doing work/chores Somewhat difficult  Not difficult at all      Interpretation of Total Score  Total Score Depression Severity:  1-4 = Minimal depression, 5-9 = Mild depression, 10-14 = Moderate depression, 15-19 = Moderately severe depression, 20-27 = Severe depression   Psychosocial Evaluation and Intervention:  Psychosocial Evaluation - 06/19/21 0941       Psychosocial Evaluation & Interventions   Comments Isaac Harrison states he has no symptoms of  anxiety or depression.  He remains tobacco free in anticipation of the procedure at [facility].  He was able to walk all the way in today which was one of his goals when he started the program.    Expected Outcomes Short: continue to exercise to help manage stress Long: maintain positive outlook             Psychosocial Re-Evaluation:  Psychosocial Re-Evaluation     Row Name 07/19/21 0949 08/28/21 2620           Psychosocial Re-Evaluation   Current issues with -- Current Stress Concerns      Comments Patient states that his mood has been good and has not been feeling depressed. He states no changes in mental status Isaac Harrison is doing well in rehab.  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  He  is thinking he got the treatment as he is starting to feel better.  However, he did have a little scare after this last surgery as he started to cough up blood.  That is now doing better and he feels good again.  He deneis any other major stressors at this time.  He is still sleeping well.  He is eager to get back to his exercise routine again too.      Expected Outcomes Short: Attend [facility] stress management education to decrease stress. Long: Maintain exercise [facility] to keep stress at a minimum. Short: Continue to exercise for mental boost Long: Conitnue to stay positive      Interventions -- Encouraged to attend Pulmonary Rehabilitation for the exercise      Continue Psychosocial Services  -- Follow up required by staff               Psychosocial Discharge (Final Psychosocial Re-Evaluation):  Psychosocial Re-Evaluation - 08/28/21 0937       Psychosocial Re-Evaluation   Current issues with Current Stress Concerns    Comments Isaac Harrison is doing well in rehab.  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  He is thinking he got the treatment as he is starting to feel better.  However, he did have a little scare after this last surgery as he started to cough up blood.  That is now doing better and he feels good again.  He deneis any other major stressors at this time.  He is still sleeping well.  He is eager to get back to his exercise routine again too.    Expected Outcomes Short: Continue to exercise for mental boost Long: Conitnue to stay positive    Interventions Encouraged to attend Pulmonary Rehabilitation for the exercise    Continue Psychosocial Services  Follow up required by staff             Education: Education Goals: Education classes will be provided on a weekly basis, covering required topics. Participant will state understanding/return demonstration of topics presented.  Learning Barriers/Preferences:  Learning Barriers/Preferences -  05/18/21 1027       Learning Barriers/Preferences   Learning Barriers None    Learning Preferences None             General Pulmonary Education Topics:  Infection Prevention: - Provides verbal and written material to individual with discussion of infection control including proper hand washing and proper equipment cleaning during exercise session. Flowsheet Row Pulmonary Rehab from 08/30/2021 in [facility] Cardiac and Pulmonary Rehab  Education need identified 05/24/21  Date 05/24/21  Educator [place]  Instruction Review Code 1- Verbalizes Understanding       Falls Prevention: - Provides verbal and written material to individual with discussion of falls prevention and safety. Flowsheet Row Pulmonary Rehab from 08/30/2021 in [facility] Cardiac and Pulmonary Rehab  Education need identified 05/24/21  Date 05/24/21  Educator [place]  Instruction Review Code 1- Verbalizes Understanding       Chronic Lung Disease Review: - Group verbal instruction with posters, models, PowerPoint presentations and videos,  to review new updates, new respiratory medications, new advancements in procedures and treatments. Providing information on websites and "800" numbers for continued self-education. Includes information about supplement oxygen, available portable oxygen systems, continuous and intermittent flow rates, oxygen safety, concentrators, and Medicare reimbursement for oxygen. Explanation of Pulmonary Drugs, including class, frequency, complications, importance of spacers, rinsing mouth after steroid MDI's, and proper cleaning methods for nebulizers. Review of basic lung anatomy and physiology related to function, structure, and complications of lung disease. Review of risk factors. Discussion about methods for diagnosing sleep apnea and types of masks and machines for OSA. Includes a review of the use of types of environmental controls: home humidity, furnaces, filters, dust mite/pet prevention, HEPA vacuums.  Discussion about weather changes, air quality and the benefits of nasal washing. Instruction on Warning signs, infection symptoms, calling MD promptly, preventive modes, and value of vaccinations. Review of effective airway clearance, coughing and/or vibration techniques. Emphasizing that all should Create an Action Plan. Written material given at graduation. Flowsheet Row Pulmonary Rehab from 08/30/2021 in [facility] Cardiac and Pulmonary Rehab  Education need identified 05/24/21       AED/CPR: - Group verbal and written instruction with the use of models to demonstrate the basic use of the AED with the basic ABC's of resuscitation.    Anatomy and Cardiac Procedures: - Group verbal and visual presentation and models provide information about basic cardiac anatomy and function. Reviews the testing methods done to diagnose heart disease and the outcomes of the test results. Describes the treatment choices: Medical Management, Angioplasty, or Coronary Bypass Surgery for treating various heart conditions including Myocardial Infarction, Angina, Valve Disease, and Cardiac Arrhythmias.  Written material given at graduation.   Medication Safety: - Group verbal and visual instruction to review commonly prescribed medications for heart and lung disease. Reviews the medication, class of the drug, and side effects. Includes the steps to properly store meds and maintain the prescription regimen.  Written material given at graduation.   Other: -Provides group and verbal instruction on various topics (see comments)   Knowledge Questionnaire Score:  Knowledge Questionnaire Score - 05/24/21 1208       Knowledge Questionnaire Score   Pre Score 17/18: Oxygen              Core Components/Risk Factors/Patient Goals at Admission:  Personal Goals and Risk Factors at Admission - 05/24/21 1418       Core Components/Risk Factors/Patient Goals on Admission    Weight Management Yes;Weight Loss   Patient  goal; BMI < 25   Intervention Weight Management: Develop a combined nutrition and exercise program designed to reach desired caloric intake, while maintaining appropriate intake of nutrient and fiber, sodium and fats, and appropriate energy expenditure required for the weight goal.;Weight Management: Provide education and appropriate resources to help participant work on and attain dietary goals.;Weight Management/Obesity: Establish reasonable short term and long term weight goals.    Admit Weight 176 lb (79.8 kg)    Goal Weight: Short Term 171  lb (77.6 kg)    Goal Weight: Long Term 165 lb (74.8 kg)    Expected Outcomes Short Term: Continue to assess and modify interventions until short term weight is achieved;Long Term: Adherence to nutrition and physical activity/exercise program aimed toward attainment of established weight goal;Weight Loss: Understanding of general recommendations for a balanced deficit meal plan, which promotes 1-2 lb weight loss per week and includes a negative energy balance of [phone] kcal/d;Understanding recommendations for meals to include 15-35% energy as protein, 25-35% energy from fat, 35-60% energy from carbohydrates, less than 253m of dietary cholesterol, 20-35 gm of total fiber daily;Understanding of distribution of calorie intake throughout the day with the consumption of 4-5 meals/snacks    Tobacco Cessation Yes    Number of packs per day MLukenhas recently quit tobacco use within the last 6 months. Intervention for relapse prevention was provided at the initial medical review. He was encouraged to continue to with tobacco cessation and was provided information on relapse prevention. Patient received information about combination therapy, tobacco cessation classes, quit line, and quit smoking apps in case of a relapse. Patient demonstrated understanding of this material.Staff will continue to provide encouragement and follow up with the patient throughout the program.     Intervention Assist the participant in steps to quit. Provide individualized education and counseling about committing to Tobacco Cessation, relapse prevention, and pharmacological support that can be provided by physician.;OAdvice worker assist with locating and accessing local/national Quit Smoking programs, and support quit date choice.    Expected Outcomes Short Term: Will demonstrate readiness to quit, by selecting a quit date.;Short Term: Will quit all tobacco product use, adhering to prevention of relapse plan.;Long Term: Complete abstinence from all tobacco products for at least 12 months from quit date.    Improve shortness of breath with ADL's Yes    Intervention Provide education, individualized exercise plan and daily activity instruction to help decrease symptoms of SOB with activities of daily living.    Expected Outcomes Short Term: Improve cardiorespiratory fitness to achieve a reduction of symptoms when performing ADLs;Long Term: Be able to perform more ADLs without symptoms or delay the onset of symptoms    Increase knowledge of respiratory medications and ability to use respiratory devices properly  Yes    Intervention Provide education and demonstration as needed of appropriate use of medications, inhalers, and oxygen therapy.    Expected Outcomes Short Term: Achieves understanding of medications use. Understands that oxygen is a medication prescribed by physician. Demonstrates appropriate use of inhaler and oxygen therapy.;Long Term: Maintain appropriate use of medications, inhalers, and oxygen therapy.    Intervention Provide education on lifestyle modifcations including regular physical activity/exercise, weight management, moderate sodium restriction and increased consumption of fresh fruit, vegetables, and low fat dairy, alcohol moderation, and smoking cessation.;Monitor prescription use compliance.    Expected Outcomes Short Term: Continued assessment and  intervention until BP is < 140/949mHG in hypertensive participants. < 130/8048mG in hypertensive participants with diabetes, heart failure or chronic kidney disease.;Long Term: Maintenance of blood pressure at goal levels.             Education:Diabetes - Individual verbal and written instruction to review signs/symptoms of diabetes, desired ranges of glucose level fasting, after meals and with exercise. Acknowledge that pre and post exercise glucose checks will be done for 3 sessions at entry of program.   Know Your Numbers and Heart Failure: - Group verbal and visual instruction to discuss disease risk factors  for cardiac and pulmonary disease and treatment options.  Reviews associated critical values for Overweight/Obesity, Hypertension, Cholesterol, and Diabetes.  Discusses basics of heart failure: signs/symptoms and treatments.  Introduces Heart Failure Zone chart for action plan for heart failure.  Written material given at graduation.   Core Components/Risk Factors/Patient Goals Review:   Goals and Risk Factor Review     Row Name 06/19/21 0939 07/19/21 0936 08/28/21 0941         Core Components/Risk Factors/Patient Goals Review   Personal Goals Review Tobacco Cessation;Develop more efficient breathing techniques such as purse lipped breathing and diaphragmatic breathing and practicing self-pacing with activity.;Improve shortness of breath with ADL's;Hypertension Tobacco Cessation;Improve shortness of breath with ADL's;Develop more efficient breathing techniques such as purse lipped breathing and diaphragmatic breathing and practicing self-pacing with activity. Tobacco Cessation;Improve shortness of breath with ADL's;Develop more efficient breathing techniques such as purse lipped breathing and diaphragmatic breathing and practicing self-pacing with activity.;Weight Management/Obesity     Review Isaac Harrison is still not smoking in anticipation of a surgery to help his lungs/breathing.  It  is an experimental procedure.  He is taking meds as directed.  He uses PLB when needed. Isaac Harrison remains tobacco free.  He has had half of the experimental procedure and has the other half scheduled for 12/7.  He says hi sbreathing seems to be a little better.  He can do more wihtout having to rest.  He feels this is from exercise.  He does monitor oxygen and BP at home.  BP averages 125-135/70-80.  Oxygen stays above 88%.  He uses PLB and feels comfortable with it. Isaac Harrison is doing well.  Still tobacco free!  His weight is steady and blood pressures continue to do well.  He is starting to feel like he is breathing better and doing well on his meds.     Expected Outcomes Short:  maintain tobacco free Long: become proficient at PLB Short: maintain tobacco free status Long: continue to monitor BP and oxygen at home Short: Continue to work on breathing Long: Conitnue to manage breathing better              Core Components/Risk Factors/Patient Goals at Discharge (Final Review):   Goals and Risk Factor Review - 08/28/21 0941       Core Components/Risk Factors/Patient Goals Review   Personal Goals Review Tobacco Cessation;Improve shortness of breath with ADL's;Develop more efficient breathing techniques such as purse lipped breathing and diaphragmatic breathing and practicing self-pacing with activity.;Weight Management/Obesity    Review Isaac Harrison is doing well.  Still tobacco free!  His weight is steady and blood pressures continue to do well.  He is starting to feel like he is breathing better and doing well on his meds.    Expected Outcomes Short: Continue to work on breathing Long: Conitnue to manage breathing better             ITP Comments:  ITP Comments     Row Name 05/18/21 1049 05/24/21 1407 05/29/21 0925 06/13/21 1411 06/26/21 1109   ITP Comments Virtual orientation call completed today. he has an appointment on Date: 05/24/2021  for EP eval and gym Orientation.  Documentation of diagnosis can be  found in [facility] Date: 08/02/2022[name] has recently quit tobacco use within the last 6 months. Intervention for relapse prevention was provided at the initial medical review. He was encouraged to continue to with tobacco cessation and was provided information on relapse prevention. Patient received information about combination therapy, tobacco cessation classes,  quit line, and quit smoking apps in case of a relapse. Patient demonstrated understanding of this material.Staff will continue to provide encouragement and follow up with the patient throughout the program. Completed 6MWT and gym orientation. Initial ITP created and sent for review to Dr. Ottie Glazier, Medical Director. First full day of exercise!  Patient was oriented to gym and equipment including functions, settings, policies, and procedures.  Patient's individual exercise prescription and treatment plan were reviewed.  All starting workloads were established based on the results of the 6 minute walk test done at initial orientation visit.  The plan for exercise progression was also introduced and progression will be customized based on patient's performance and goals. 30 day review completed. ITP sent to Dr. Zetta Bills, Medical Director of  Pulmonary Rehab. Continue with ITP unless changes are made by physician. Completed initial RD consultation    [place] Name 07/11/21 0804 08/08/21 0746 08/27/21 1035 09/05/21 0837     ITP Comments 30 Day review completed. Medical Director ITP review done, changes made as directed, and signed approval by Medical Director. 30 Day review completed. Medical Director ITP review done, changes made as directed, and signed approval by Medical Director. Isaac Harrison has been out due to having surgery and then getting sick. He should be due to return back to rehab soon. 30 Day review completed. Medical Director ITP review done, changes made as directed, and signed approval by Medical Director.             Comments:

## 2021-09-06 ENCOUNTER — Other Ambulatory Visit: Payer: Self-pay

## 2021-09-06 DIAGNOSIS — J449 Chronic obstructive pulmonary disease, unspecified: Secondary | ICD-10-CM

## 2021-09-06 NOTE — Progress Notes (Signed)
Daily Session Note  Patient Details  Name: Isaac Harrison MRN: [id number] Date of Birth: [birth year] Referring Provider:   Flowsheet Row Pulmonary Rehab from 05/24/2021 in [facility] Cardiac and Pulmonary Rehab  Referring Provider [name] [facility])       Encounter Date: 09/06/2021  Check In:  Session Check In - 09/06/21 1037       Check-In   Supervising physician immediately available to respond to emergencies See telemetry face sheet for immediately available ER MD    Location [facility]-Cardiac & Pulmonary Rehab    Staff Present Will Bonnet, RN,BC,MSN;Amanda Oletta Darter, [place], ACSM CEP, Exercise Physiologist;Melissa Caiola, RDN, LDN    Virtual Visit No    Medication changes reported     No    Fall or balance concerns reported    No    Warm-up and Cool-down Performed on first and last piece of equipment    Resistance Training Performed Yes    VAD Patient? No    PAD/SET Patient? No      Pain Assessment   Currently in Pain? No/denies                Social History   Tobacco Use  Smoking Status Former   Packs/day: 1.50   Years: 35.00   Pack years: 52.50   Types: Cigarettes   Quit date: 03/09/2021   Years since quitting: 0.4  Smokeless Tobacco Never  Tobacco Comments   Quit   not smoking any at all    Goals Met:  Independence with exercise equipment Exercise tolerated well No report of concerns or symptoms today Strength training completed today  Goals Unmet:  Not Applicable  Comments: Pt able to follow exercise prescription today without complaint.  Will continue to monitor for progression.    Dr. Emily Filbert is Medical Director for [place].  Dr. Ottie Glazier is Medical Director for [facility] Pulmonary Rehabilitation.

## 2021-09-18 ENCOUNTER — Other Ambulatory Visit: Payer: Self-pay

## 2021-09-18 ENCOUNTER — Encounter: Payer: No Typology Code available for payment source | Attending: Pulmonary Disease

## 2021-09-18 DIAGNOSIS — J449 Chronic obstructive pulmonary disease, unspecified: Secondary | ICD-10-CM | POA: Insufficient documentation

## 2021-09-18 DIAGNOSIS — R0600 Dyspnea, unspecified: Secondary | ICD-10-CM | POA: Diagnosis not present

## 2021-09-18 NOTE — Progress Notes (Signed)
Daily Session Note  Patient Details  Name: Isaac Harrison MRN: [id number] Date of Birth: 07/22/1956 Referring Provider:   Flowsheet Row Pulmonary Rehab from 05/24/2021 in [facility] Cardiac and Pulmonary Rehab  Referring Provider [name] [facility])       Encounter Date: 09/18/2021  Check In:  Session Check In - 09/18/21 0910       Check-In   Supervising physician immediately available to respond to emergencies See telemetry face sheet for immediately available ER MD    Location [facility]-Cardiac & Pulmonary Rehab    Staff Present [name], MPA, RN;Amanda Sommer, BA, ACSM CEP, Exercise Physiologist;Jessica Luan Pulling, MA, RCEP, CCRP, CCET    Virtual Visit No    Medication changes reported     No    Fall or balance concerns reported    No    Warm-up and Cool-down Performed on first and last piece of equipment    Resistance Training Performed Yes    VAD Patient? No    PAD/SET Patient? No      Pain Assessment   Currently in Pain? No/denies                Social History   Tobacco Use  Smoking Status Former   Packs/day: 1.50   Years: 35.00   Pack years: 52.50   Types: Cigarettes   Quit date: 03/09/2021   Years since quitting: 0.5  Smokeless Tobacco Never  Tobacco Comments   Quit   not smoking any at all    Goals Met:  Independence with exercise equipment Exercise tolerated well Personal goals reviewed No report of concerns or symptoms today Strength training completed today  Goals Unmet:  Not Applicable  Comments: Pt able to follow exercise prescription today without complaint.  Will continue to monitor for progression.    Dr. Emily Filbert is Medical Director for [place].  Dr. Ottie Glazier is Medical Director for [facility] Pulmonary Rehabilitation.

## 2021-09-20 ENCOUNTER — Other Ambulatory Visit: Payer: Self-pay

## 2021-09-20 DIAGNOSIS — J449 Chronic obstructive pulmonary disease, unspecified: Secondary | ICD-10-CM | POA: Diagnosis not present

## 2021-09-20 NOTE — Progress Notes (Signed)
Daily Session Note  Patient Details  Name: Isaac Harrison MRN: [id number] Date of Birth: 11-11-1955 Referring Provider:   Flowsheet Row Pulmonary Rehab from 05/24/2021 in [facility] Cardiac and Pulmonary Rehab  Referring Provider [name] [facility])       Encounter Date: 09/20/2021  Check In:  Session Check In - 09/20/21 0915       Check-In   Supervising physician immediately available to respond to emergencies See telemetry face sheet for immediately available ER MD    Location [facility]-Cardiac & Pulmonary Rehab    Staff Present [name], MPA, RN;Melissa [place], RDN, LDN;Jessica Luan Pulling, MA, RCEP, CCRP, CCET    Virtual Visit No    Medication changes reported     No    Fall or balance concerns reported    No    Warm-up and Cool-down Performed on first and last piece of equipment    Resistance Training Performed Yes    VAD Patient? No    PAD/SET Patient? No      Pain Assessment   Currently in Pain? No/denies                Social History   Tobacco Use  Smoking Status Former   Packs/day: 1.50   Years: 35.00   Pack years: 52.50   Types: Cigarettes   Quit date: 03/09/2021   Years since quitting: 0.5  Smokeless Tobacco Never  Tobacco Comments   Quit   not smoking any at all    Goals Met:  Independence with exercise equipment Exercise tolerated well No report of concerns or symptoms today Strength training completed today  Goals Unmet:  Not Applicable  Comments: Pt able to follow exercise prescription today without complaint.  Will continue to monitor for progression.    Dr. Emily Filbert is Medical Director for [place].  Dr. Ottie Glazier is Medical Director for [facility] Pulmonary Rehabilitation.

## 2021-09-25 ENCOUNTER — Other Ambulatory Visit: Payer: Self-pay

## 2021-09-25 DIAGNOSIS — J449 Chronic obstructive pulmonary disease, unspecified: Secondary | ICD-10-CM

## 2021-09-25 NOTE — Progress Notes (Signed)
Daily Session Note  Patient Details  Name: WHITNEY BINGAMAN MRN: [id number] Date of Birth: 01-12-1956 Referring Provider:   Flowsheet Row Pulmonary Rehab from 05/24/2021 in [facility] Cardiac and Pulmonary Rehab  Referring Provider [name] [facility])       Encounter Date: 09/25/2021  Check In:  Session Check In - 09/25/21 0918       Check-In   Supervising physician immediately available to respond to emergencies See telemetry face sheet for immediately available ER MD    Location [facility]-Cardiac & Pulmonary Rehab    Staff Present [name], MPA, RN;Amanda Oletta Darter, BA, ACSM CEP, Exercise Physiologist    Virtual Visit No    Medication changes reported     No    Fall or balance concerns reported    No    Warm-up and Cool-down Performed on first and last piece of equipment    Resistance Training Performed Yes    VAD Patient? No    PAD/SET Patient? No      Pain Assessment   Currently in Pain? No/denies                Social History   Tobacco Use  Smoking Status Former   Packs/day: 1.50   Years: 35.00   Pack years: 52.50   Types: Cigarettes   Quit date: 03/09/2021   Years since quitting: 0.5  Smokeless Tobacco Never  Tobacco Comments   Quit   not smoking any at all    Goals Met:  Independence with exercise equipment Exercise tolerated well No report of concerns or symptoms today Strength training completed today  Goals Unmet:  Not Applicable  Comments: Pt able to follow exercise prescription today without complaint.  Will continue to monitor for progression.    Dr. Emily Filbert is Medical Director for [place].  Dr. Ottie Glazier is Medical Director for [place] Pulmonary Rehabilitation.

## 2021-09-27 ENCOUNTER — Other Ambulatory Visit: Payer: Self-pay

## 2021-09-27 DIAGNOSIS — J449 Chronic obstructive pulmonary disease, unspecified: Secondary | ICD-10-CM

## 2021-09-27 NOTE — Progress Notes (Signed)
Daily Session Note  Patient Details  Name: Isaac Harrison MRN: [id number] Date of Birth: 1956/03/30 Referring Provider:   Flowsheet Row Pulmonary Rehab from 05/24/2021 in [facility] Cardiac and Pulmonary Rehab  Referring Provider [name] [facility])       Encounter Date: 09/27/2021  Check In:  Session Check In - 09/27/21 0912       Check-In   Supervising physician immediately available to respond to emergencies See telemetry face sheet for immediately available ER MD    Location [facility]-Cardiac & Pulmonary Rehab    Staff Present [name], MPA, RN;Amanda Sommer, BA, ACSM CEP, Exercise Physiologist;Jessica Luan Pulling, MA, RCEP, CCRP, CCET    Virtual Visit No    Medication changes reported     No    Fall or balance concerns reported    No    Warm-up and Cool-down Performed on first and last piece of equipment    Resistance Training Performed Yes    VAD Patient? No    PAD/SET Patient? No      Pain Assessment   Currently in Pain? No/denies                Social History   Tobacco Use  Smoking Status Former   Packs/day: 1.50   Years: 35.00   Pack years: 52.50   Types: Cigarettes   Quit date: 03/09/2021   Years since quitting: 0.5  Smokeless Tobacco Never  Tobacco Comments   Quit   not smoking any at all    Goals Met:  Independence with exercise equipment Exercise tolerated well No report of concerns or symptoms today Strength training completed today  Goals Unmet:  Not Applicable  Comments: Pt able to follow exercise prescription today without complaint.  Will continue to monitor for progression.    Dr. Emily Filbert is Medical Director for [place].  Dr. Ottie Glazier is Medical Director for [facility] Pulmonary Rehabilitation.

## 2021-10-02 ENCOUNTER — Other Ambulatory Visit: Payer: Self-pay

## 2021-10-02 DIAGNOSIS — J449 Chronic obstructive pulmonary disease, unspecified: Secondary | ICD-10-CM

## 2021-10-02 NOTE — Progress Notes (Signed)
Daily Session Note  Patient Details  Name: Isaac Harrison MRN: [id number] Date of Birth: [birth year] Referring Provider:   Flowsheet Row Pulmonary Rehab from 05/24/2021 in [facility] Cardiac and Pulmonary Rehab  Referring Provider [name] [facility])       Encounter Date: 10/02/2021  Check In:  Session Check In - 10/02/21 0929       Check-In   Supervising physician immediately available to respond to emergencies See telemetry face sheet for immediately available ER MD    Location [facility]-Cardiac & Pulmonary Rehab    Staff Present [name], MPA, RN;Jessica Luan Pulling, MA, RCEP, CCRP, CCET;Melissa [place], RDN, LDN    Virtual Visit No    Medication changes reported     No    Fall or balance concerns reported    No    Warm-up and Cool-down Performed on first and last piece of equipment    Resistance Training Performed Yes    VAD Patient? No    PAD/SET Patient? No      Pain Assessment   Currently in Pain? No/denies                Social History   Tobacco Use  Smoking Status Former   Packs/day: 1.50   Years: 35.00   Pack years: 52.50   Types: Cigarettes   Quit date: 03/09/2021   Years since quitting: 0.5  Smokeless Tobacco Never  Tobacco Comments   Quit   not smoking any at all    Goals Met:  Independence with exercise equipment Exercise tolerated well No report of concerns or symptoms today Strength training completed today  Goals Unmet:  Not Applicable  Comments: Pt able to follow exercise prescription today without complaint.  Will continue to monitor for progression.    Dr. Emily Filbert is Medical Director for [place].  Dr. Ottie Glazier is Medical Director for [facility] Pulmonary Rehabilitation.

## 2021-10-03 ENCOUNTER — Encounter: Payer: Self-pay | Admitting: *Deleted

## 2021-10-03 DIAGNOSIS — J449 Chronic obstructive pulmonary disease, unspecified: Secondary | ICD-10-CM

## 2021-10-03 NOTE — Progress Notes (Signed)
Pulmonary Individual Treatment Plan  Patient Details  Name: Isaac Harrison MRN: [id number] Date of Birth: 1956-02-18 Referring Provider:   Flowsheet Row Pulmonary Rehab from 05/24/2021 in [facility] Cardiac and Pulmonary Rehab  Referring Provider [name] [facility])       Initial Encounter Date:  Flowsheet Row Pulmonary Rehab from 05/24/2021 in [facility] Cardiac and Pulmonary Rehab  Date 05/24/21       Visit Diagnosis: Chronic obstructive pulmonary disease, unspecified COPD type ([place])  Patient's Home Medications on Admission:  Current Outpatient Medications:    albuterol (PROVENTIL HFA;VENTOLIN HFA) 108 (90 Base) MCG/ACT inhaler, Inhale 2 puffs into the lungs every 6 (six) hours as needed for wheezing or shortness of breath., Disp: 1 Inhaler, Rfl: 0   albuterol (PROVENTIL) (2.5 MG/3ML) 0.083% nebulizer solution, USE 1 AMPULE BY ORAL INHALATION FOUR TIMES A DAY AS NEEDED FOR BREATHING / SHORTNESS OF BREATH, Disp: , Rfl:    amLODipine (NORVASC) 10 MG tablet, Take by mouth., Disp: , Rfl:    azithromycin (ZITHROMAX Z-PAK) 250 MG tablet, Take 2 tablets (500 mg) on  Day 1,  followed by 1 tablet (250 mg) once daily on Days 2 through 5. (Patient not taking: Reported on 04/07/2018), Disp: 6 each, Rfl: 0   Budeson-Glycopyrrol-Formoterol (BREZTRI AEROSPHERE) 160-9-4.8 MCG/ACT AERO, Inhale 2 Inhalers into the lungs 2 (two) times daily., Disp: , Rfl:    fluticasone (FLONASE) 50 MCG/ACT nasal spray, INSTILL 2 SPRAYS INTO EACH NOSTRIL ONCE EVERY DAY MAXIMUM 2 SPRAYS IN EACH NOSTRIL DAILY. FOR ALLERGIES, Disp: , Rfl:    guaiFENesin (MUCINEX) 600 MG 12 hr tablet, Take 1 tablet by mouth 2 (two) times daily., Disp: , Rfl:    predniSONE (DELTASONE) 20 MG tablet, Take 3 tablets (60 mg total) by mouth daily., Disp: 12 tablet, Rfl: 0   predniSONE (DELTASONE) 20 MG tablet, Take 3 tablets (60 mg total) by mouth daily. (Patient not taking: Reported on 05/18/2021), Disp: 12 tablet, Rfl: 0   Spacer/Aero Chamber Mouthpiece  MISC, 1 Units by Does not apply route every 4 (four) hours as needed (wheezing). (Patient not taking: Reported on 04/07/2018), Disp: 1 each, Rfl: 0  Past Medical History: Past Medical History:  Diagnosis Date   COPD (chronic obstructive pulmonary disease) ([place])     Tobacco Use: Social History   Tobacco Use  Smoking Status Former   Packs/day: 1.50   Years: 35.00   Pack years: 52.50   Types: Cigarettes   Quit date: 03/09/2021   Years since quitting: 0.5  Smokeless Tobacco Never  Tobacco Comments   Quit   not smoking any at all    Labs: Recent Review Flowsheet Data     Labs for ITP Cardiac and Pulmonary Rehab Latest Ref Rng & Units 04/07/2018   HCO3 20.0 - 28.0 mmol/L 27.1   ACIDBASEDEF 0.0 - 2.0 mmol/L 0.6        Pulmonary Assessment Scores:  Pulmonary Assessment Scores     Row Name 05/24/21 1154         ADL UCSD   ADL Phase Entry     SOB Score total 52     Rest 1     Walk 3     Stairs 4     Bath 3     Dress 3     Shop 3       CAT Score   CAT Score 29       mMRC Score   mMRC Score 2.5  UCSD: Self-administered rating of dyspnea associated with activities of daily living (ADLs) 6-point scale (0 = "not at all" to 5 = "maximal or unable to do because of breathlessness")  Scoring Scores range from 0 to 120.  Minimally important difference is 5 units  CAT: CAT can identify the health impairment of COPD patients and is better correlated with disease progression.  CAT has a scoring range of zero to 40. The CAT score is classified into four groups of low (less than 10), medium (10 - 20), high (21-30) and very high (31-40) based on the impact level of disease on health status. A CAT score over 10 suggests significant symptoms.  A worsening CAT score could be explained by an exacerbation, poor medication adherence, poor inhaler technique, or progression of COPD or comorbid conditions.  CAT MCID is 2 points  mMRC: mMRC (Modified Medical Research  Council) Dyspnea Scale is used to assess the degree of baseline functional disability in patients of respiratory disease due to dyspnea. No minimal important difference is established. A decrease in score of 1 point or greater is considered a positive change.   Pulmonary Function Assessment:   Exercise Target Goals: Exercise Program Goal: Individual exercise prescription set using results from initial 6 min walk test and THRR while considering  patients activity barriers and safety.   Exercise Prescription Goal: Initial exercise prescription builds to 30-45 minutes a day of aerobic activity, 2-3 days per week.  Home exercise guidelines will be given to patient during program as part of exercise prescription that the participant will acknowledge.  Education: Aerobic Exercise: - Group verbal and visual presentation on the components of exercise prescription. Introduces F.I.T.T principle from ACSM for exercise prescriptions.  Reviews F.I.T.T. principles of aerobic exercise including progression. Written material given at graduation. Flowsheet Row Pulmonary Rehab from 09/27/2021 in [facility] Cardiac and Pulmonary Rehab  Date 07/05/21  Educator [facility]  Instruction Review Code 1- Verbalizes Understanding       Education: Resistance Exercise: - Group verbal and visual presentation on the components of exercise prescription. Introduces F.I.T.T principle from ACSM for exercise prescriptions  Reviews F.I.T.T. principles of resistance exercise including progression. Written material given at graduation.    Education: Exercise & Equipment Safety: - Individual verbal instruction and demonstration of equipment use and safety with use of the equipment. Flowsheet Row Pulmonary Rehab from 09/27/2021 in [facility] Cardiac and Pulmonary Rehab  Education need identified 05/24/21  Date 05/24/21  Educator [place]  Instruction Review Code 1- Verbalizes Understanding       Education: Exercise Physiology & General Exercise  Guidelines: - Group verbal and written instruction with models to review the exercise physiology of the cardiovascular system and associated critical values. Provides general exercise guidelines with specific guidelines to those with heart or lung disease.  Flowsheet Row Pulmonary Rehab from 09/27/2021 in [facility] Cardiac and Pulmonary Rehab  Date 06/28/21  Educator [facility]  Instruction Review Code 1- Verbalizes Understanding       Education: Flexibility, Balance, Mind/Body Relaxation: - Group verbal and visual presentation with interactive activity on the components of exercise prescription. Introduces F.I.T.T principle from ACSM for exercise prescriptions. Reviews F.I.T.T. principles of flexibility and balance exercise training including progression. Also discusses the mind body connection.  Reviews various relaxation techniques to help reduce and manage stress (i.e. Deep breathing, progressive muscle relaxation, and visualization). Balance handout provided to take home. Written material given at graduation. Flowsheet Row Pulmonary Rehab from 09/27/2021 in [facility] Cardiac and Pulmonary Rehab  Date 07/19/21  Educator [facility]  Instruction Review Code 1- Verbalizes Understanding       Activity Barriers & Risk Stratification:  Activity Barriers & Cardiac Risk Stratification - 05/24/21 1202       Activity Barriers & Cardiac Risk Stratification   Activity Barriers Shortness of Breath;Deconditioning             6 Minute Walk:  6 Minute Walk     Row Name 05/24/21 1408         6 Minute Walk   Phase Initial     Distance 975 feet     Walk Time 6 minutes     # of Rest Breaks 0     MPH 1.84     METS 3.02     RPE 11     Perceived Dyspnea  2     VO2 Peak 10.57     Symptoms Yes (comment)     Comments SOB     Resting HR 71 bpm     Resting BP 144/74     Resting Oxygen Saturation  95 %     Exercise Oxygen Saturation  during 6 min walk 89 %     Max Ex. HR 100 bpm     Max Ex. BP 164/76     2  Minute Post BP 144/74       Interval HR   1 Minute HR 92     2 Minute HR 93     3 Minute HR 95     4 Minute HR 100     5 Minute HR 97     6 Minute HR 98     2 Minute Post HR 78     Interval Heart Rate? Yes       Interval Oxygen   Interval Oxygen? Yes     Baseline Oxygen Saturation % 95 %     1 Minute Oxygen Saturation % 92 %     1 Minute Liters of Oxygen 0 L  RA     2 Minute Oxygen Saturation % 93 %     2 Minute Liters of Oxygen 0 L     3 Minute Oxygen Saturation % 91 %     3 Minute Liters of Oxygen 0 L     4 Minute Oxygen Saturation % 89 %     4 Minute Liters of Oxygen 0 L     5 Minute Oxygen Saturation % 90 %     5 Minute Liters of Oxygen 0 L     6 Minute Oxygen Saturation % 89 %     6 Minute Liters of Oxygen 0 L     2 Minute Post Oxygen Saturation % 96 %     2 Minute Post Liters of Oxygen 0 L             Oxygen Initial Assessment:  Oxygen Initial Assessment - 05/24/21 1154       Home Oxygen   Home Oxygen Device None    Sleep Oxygen Prescription None    Home Exercise Oxygen Prescription None    Home Resting Oxygen Prescription None      Initial 6 min Walk   Oxygen Used None      Program Oxygen Prescription   Program Oxygen Prescription None      Intervention   Short Term Goals To learn and demonstrate proper pursed lip breathing techniques or other breathing techniques. ;To learn and understand importance of maintaining oxygen saturations>88%;To learn  and demonstrate proper use of respiratory medications;To learn and understand importance of monitoring SPO2 with pulse oximeter and demonstrate accurate use of the pulse oximeter.    Long  Term Goals Verbalizes importance of monitoring SPO2 with pulse oximeter and return demonstration;Maintenance of O2 saturations>88%;Exhibits proper breathing techniques, such as pursed lip breathing or other method taught during program session;Compliance with respiratory medication;Demonstrates proper use of MDIs              Oxygen Re-Evaluation:  Oxygen Re-Evaluation     Row Name 05/29/21 0925 06/19/21 1023 07/19/21 0944 08/28/21 0955 09/18/21 0938     Program Oxygen Prescription   Program Oxygen Prescription _0      Home Oxygen   Home Oxygen Device _1    Sleep Oxygen Prescription _2    Home Exercise Oxygen Prescription _3    Home Resting Oxygen Prescription _4    Compliance with Home Oxygen Use -- Yes Yes Yes --     Goals/Expected Outcomes   Short Term Goals To learn and demonstrate proper pursed lip breathing techniques or other breathing techniques. ;To learn and understand importance of maintaining oxygen saturations>88%;To learn and understand importance of monitoring SPO2 with pulse oximeter and demonstrate accurate use of the pulse oximeter. To learn and demonstrate proper pursed lip breathing techniques or other breathing techniques. ;To learn and understand importance of maintaining oxygen saturations>88%;To learn and understand importance of monitoring SPO2 with pulse oximeter and demonstrate accurate use of the pulse oximeter. To learn and demonstrate proper pursed lip breathing techniques or other breathing techniques. ;To learn and understand importance of maintaining oxygen saturations>88%;To learn and understand importance of monitoring SPO2 with pulse oximeter and demonstrate accurate use of the pulse oximeter. To learn and demonstrate proper pursed lip breathing techniques or other breathing techniques. ;To learn and understand importance of maintaining oxygen saturations>88%;To learn and understand importance of monitoring SPO2 with pulse oximeter and demonstrate accurate use of the pulse oximeter. To learn and demonstrate proper pursed lip breathing techniques or other breathing techniques. ;To learn and understand importance of maintaining oxygen saturations>88%;To learn and understand  importance of monitoring SPO2 with pulse oximeter and demonstrate accurate use of the pulse oximeter.   Long  Term Goals Verbalizes importance of monitoring SPO2 with pulse oximeter and return demonstration;Maintenance of O2 saturations>88%;Exhibits proper breathing techniques, such as pursed lip breathing or other method taught during program session;Compliance with respiratory medication Verbalizes importance of monitoring SPO2 with pulse oximeter and return demonstration;Maintenance of O2 saturations>88%;Exhibits proper breathing techniques, such as pursed lip breathing or other method taught during program session;Compliance with respiratory medication Verbalizes importance of monitoring SPO2 with pulse oximeter and return demonstration;Maintenance of O2 saturations>88%;Exhibits proper breathing techniques, such as pursed lip breathing or other method taught during program session;Compliance with respiratory medication Verbalizes importance of monitoring SPO2 with pulse oximeter and return demonstration;Maintenance of O2 saturations>88%;Exhibits proper breathing techniques, such as pursed lip breathing or other method taught during program session;Compliance with respiratory medication Verbalizes importance of monitoring SPO2 with pulse oximeter and return demonstration;Maintenance of O2 saturations>88%;Exhibits proper breathing techniques, such as pursed lip breathing or other method taught during program session;Compliance with respiratory medication   Comments Reviewed PLB technique with pt.  Talked about how it works and it's importance in maintaining their exercise saturations. Ronalee Belts uses PLB as needed and takes medication as directed. Ronalee Belts is doing well with using PLB at home and doesnt  have any questions at this time. Ronalee Belts is doing better with his breathing. He is good with PLB and monitoring his sats. Ronalee Belts was doing better with his breathing until his cold last week.  He was using his nebulizer several  times a day last week to help.  But now, he is feeling better and breathing is improving.  He does feel that his cold set him back, but he is trying to get back.  He is good about using his PLB.   Goals/Expected Outcomes Short: Become more profiecient at using PLB.   Long: Become independent at using PLB. Short: continue to use PLB Long: be able to do ADLS without shortness of breath Short:  continue to use PLB when needed Long: maintain/improve ability to do ADLs without rest Short:  continue to use PLB when needed Long: maintain/improve ability to do ADLs without rest Short: Continue to recover from cold Long: continue to improve breathing and manage lung disease            Oxygen Discharge (Final Oxygen Re-Evaluation):  Oxygen Re-Evaluation - 09/18/21 0938       Program Oxygen Prescription   Program Oxygen Prescription None      Home Oxygen   Home Oxygen Device None    Sleep Oxygen Prescription None    Home Exercise Oxygen Prescription None    Home Resting Oxygen Prescription None      Goals/Expected Outcomes   Short Term Goals To learn and demonstrate proper pursed lip breathing techniques or other breathing techniques. ;To learn and understand importance of maintaining oxygen saturations>88%;To learn and understand importance of monitoring SPO2 with pulse oximeter and demonstrate accurate use of the pulse oximeter.    Long  Term Goals Verbalizes importance of monitoring SPO2 with pulse oximeter and return demonstration;Maintenance of O2 saturations>88%;Exhibits proper breathing techniques, such as pursed lip breathing or other method taught during program session;Compliance with respiratory medication    Comments Ronalee Belts was doing better with his breathing until his cold last week.  He was using his nebulizer several times a day last week to help.  But now, he is feeling better and breathing is improving.  He does feel that his cold set him back, but he is trying to get back.  He is good  about using his PLB.    Goals/Expected Outcomes Short: Continue to recover from cold Long: continue to improve breathing and manage lung disease             Initial Exercise Prescription:  Initial Exercise Prescription - 05/24/21 1400       Date of Initial Exercise RX and Referring Provider   Date 05/24/21    Referring Provider [name] ([place])      Recumbant Bike   Level 1    RPM 60    Watts 25    Minutes 15    METs 3      REL-XR   Level 1    Speed 50    Minutes 15    METs 3      T5 Nustep   Level 1    SPM 80    Minutes 15    METs 3      Track   Laps 17    Minutes 15    METs 1.92      Prescription Details   Frequency (times per week) 2    Duration Progress to 30 minutes of continuous aerobic without signs/symptoms of physical distress  Intensity   THRR 40-80% of Max Heartrate 103-136    Ratings of Perceived Exertion 11-13    Perceived Dyspnea 0-4      Progression   Progression Continue to progress workloads to maintain intensity without signs/symptoms of physical distress.      Resistance Training   Training Prescription Yes    Weight 3 lb    Reps 10-15             Perform Capillary Blood Glucose checks as needed.  Exercise Prescription Changes:   Exercise Prescription Changes     Row Name 05/24/21 1400 06/05/21 0700 06/18/21 1300 07/02/21 1000 07/16/21 1300     Response to Exercise   Blood Pressure (Admit) 144/74 122/64 128/64 120/60 126/72   Blood Pressure (Exercise) 164/76 140/70 158/64 134/70 --   Blood Pressure (Exit) 144/74 112/62 122/70 112/62 112/60   Heart Rate (Admit) 71 bpm 93 bpm 89 bpm 84 bpm 69 bpm   Heart Rate (Exercise) 100 bpm 119 bpm 104 bpm 103 bpm 106 bpm   Heart Rate (Exit) 72 bpm 98 bpm 92 bpm 82 bpm 83 bpm   Oxygen Saturation (Admit) 95 % 95 % 92 % 96 % 94 %   Oxygen Saturation (Exercise) 89 % 93 % 93 % 93 % 90 %   Oxygen Saturation (Exit) 96 % 95 % 95 % 94 % 93 %   Rating of Perceived Exertion  (Exercise) _0 Perceived Dyspnea (Exercise) _1 Symptoms SOB SOB -- SOB SOB   Comments walk test results second full day of exercise -- -- --   Duration -- Continue with 30 min of aerobic exercise without signs/symptoms of physical distress. Continue with 30 min of aerobic exercise without signs/symptoms of physical distress. Continue with 30 min of aerobic exercise without signs/symptoms of physical distress. Continue with 30 min of aerobic exercise without signs/symptoms of physical distress.   Intensity -- THRR unchanged THRR unchanged THRR unchanged THRR unchanged     Progression   Progression -- Continue to progress workloads to maintain intensity without signs/symptoms of physical distress. Continue to progress workloads to maintain intensity without signs/symptoms of physical distress. Continue to progress workloads to maintain intensity without signs/symptoms of physical distress. Continue to progress workloads to maintain intensity without signs/symptoms of physical distress.   Average METs -- 2.56 3.13 3.06 2.45     Resistance Training   Training Prescription -- Yes Yes Yes Yes   Weight -- 3 lb 3 lb 3 lb 3 lb   Reps -- 10-15 10-15 10-15 10-15     Interval Training   Interval Training -- No No No No     Recumbant Bike   Level -- 1 -- -- --   Watts -- 15 -- -- --   Minutes -- 15 -- -- --   METs -- 2.59 -- -- --     REL-XR   Level -- _2 --   Minutes -- _3 --   METs -- 3.6 4 4.4 --     T5 Nustep   Level -- 1 -- 3 3   Minutes -- 15 -- 15 15   METs -- 1.8 -- -- --     Track   Laps -- _4 Minutes -- _5 METs -- 2.25 2.25 2.36 2.36     Oxygen   Maintain Oxygen Saturation --  88% or higher 88% or higher 88% or higher 88% or higher    Row Name 07/30/21 1300 08/13/21 0800 09/11/21 1300 09/24/21 1400       Response to Exercise   Blood Pressure (Admit) 138/68 126/72 130/70 138/62    Blood Pressure (Exit) 112/68 122/62  142/70 126/64    Heart Rate (Admit) 97 bpm 67 bpm -- 103 bpm    Heart Rate (Exercise) 115 bpm 101 bpm 76 bpm 123 bpm    Heart Rate (Exit) 97 bpm 83 bpm 105 bpm 100 bpm    Oxygen Saturation (Admit) 94 % 95 % 83 % 94 %    Oxygen Saturation (Exercise) 91 % 91 % 95 % 92 %    Oxygen Saturation (Exit) 94 % 95 % 92 % 96 %    Rating of Perceived Exertion (Exercise) 12 13 96 11    Perceived Dyspnea (Exercise) _0 Symptoms SOB SOB -- --    Duration Continue with 30 min of aerobic exercise without signs/symptoms of physical distress. Continue with 30 min of aerobic exercise without signs/symptoms of physical distress. Continue with 30 min of aerobic exercise without signs/symptoms of physical distress. Continue with 30 min of aerobic exercise without signs/symptoms of physical distress.    Intensity THRR unchanged THRR unchanged THRR unchanged THRR unchanged      Progression   Progression Continue to progress workloads to maintain intensity without signs/symptoms of physical distress. Continue to progress workloads to maintain intensity without signs/symptoms of physical distress. Continue to progress workloads to maintain intensity without signs/symptoms of physical distress. Continue to progress workloads to maintain intensity without signs/symptoms of physical distress.    Average METs 2.57 2.26 2.6 3.07      Resistance Training   Training Prescription Yes Yes Yes Yes    Weight 4 lb 4 lb 4 lb 4 lb    Reps 10-15 10-15 10-15 10-15      Interval Training   Interval Training No No No No      Recumbant Bike   Level -- -- -- 3    Watts -- -- -- 23    Minutes -- -- -- 15    METs -- -- -- 2.98      REL-XR   Level 3 -- 3 3    Minutes 15 -- 15 15    METs 3.1 -- -- 4.3      T5 Nustep   Level 3 3 -- 3    Minutes 15 15 -- 15    METs 2 1.9 -- 2.1      Track   Laps _1 35    Minutes _2 METs 2.63 2.63 2.63 2.9      Home Exercise Plan   Plans to continue exercise at Home  (comment)  walking Home (comment)  walking Home (comment)  walking Home (comment)  walking    Frequency Add 2 additional days to program exercise sessions. Add 2 additional days to program exercise sessions. Add 2 additional days to program exercise sessions. Add 2 additional days to program exercise sessions.    Initial Home Exercises Provided 06/19/21 06/19/21 06/19/21 06/19/21      Oxygen   Maintain Oxygen Saturation 88% or higher 88% or higher 88% or higher 88% or higher             Exercise Comments:   Exercise Comments     Row Name 05/29/21  0925           Exercise Comments First full day of exercise!  Patient was oriented to gym and equipment including functions, settings, policies, and procedures.  Patient's individual exercise prescription and treatment plan were reviewed.  All starting workloads were established based on the results of the 6 minute walk test done at initial orientation visit.  The plan for exercise progression was also introduced and progression will be customized based on patient's performance and goals.                Exercise Goals and Review:   Exercise Goals     Row Name 05/24/21 1410             Exercise Goals   Increase Physical Activity Yes       Intervention Provide advice, education, support and counseling about physical activity/exercise needs.;Develop an individualized exercise prescription for aerobic and resistive training based on initial evaluation findings, risk stratification, comorbidities and participant's personal goals.       Expected Outcomes Short Term: Attend rehab on a regular basis to increase amount of physical activity.;Long Term: Add in home exercise to make exercise part of routine and to increase amount of physical activity.;Long Term: Exercising regularly at least 3-5 days a week.       Increase Strength and Stamina Yes       Intervention Provide advice, education, support and counseling about physical  activity/exercise needs.;Develop an individualized exercise prescription for aerobic and resistive training based on initial evaluation findings, risk stratification, comorbidities and participant's personal goals.       Expected Outcomes Short Term: Increase workloads from initial exercise prescription for resistance, speed, and METs.;Short Term: Perform resistance training exercises routinely during rehab and add in resistance training at home;Long Term: Improve cardiorespiratory fitness, muscular endurance and strength as measured by increased METs and functional capacity (6MWT)       Able to understand and use rate of perceived exertion (RPE) scale Yes       Intervention Provide education and explanation on how to use RPE scale       Expected Outcomes Short Term: Able to use RPE daily in rehab to express subjective intensity level;Long Term:  Able to use RPE to guide intensity level when exercising independently       Able to understand and use Dyspnea scale Yes       Intervention Provide education and explanation on how to use Dyspnea scale       Expected Outcomes Short Term: Able to use Dyspnea scale daily in rehab to express subjective sense of shortness of breath during exertion;Long Term: Able to use Dyspnea scale to guide intensity level when exercising independently       Knowledge and understanding of Target Heart Rate Range (THRR) Yes       Intervention Provide education and explanation of THRR including how the numbers were predicted and where they are located for reference       Expected Outcomes Long Term: Able to use THRR to govern intensity when exercising independently;Short Term: Able to state/look up THRR;Short Term: Able to use daily as guideline for intensity in rehab       Able to check pulse independently Yes       Intervention Provide education and demonstration on how to check pulse in carotid and radial arteries.;Review the importance of being able to check your own pulse for  safety during independent exercise  Expected Outcomes Short Term: Able to explain why pulse checking is important during independent exercise;Long Term: Able to check pulse independently and accurately       Understanding of Exercise Prescription Yes       Intervention Provide education, explanation, and written materials on patient's individual exercise prescription       Expected Outcomes Short Term: Able to explain program exercise prescription;Long Term: Able to explain home exercise prescription to exercise independently                Exercise Goals Re-Evaluation :  Exercise Goals Re-Evaluation     Row Name 05/29/21 0925 06/05/21 0749 06/18/21 1352 06/19/21 1022 07/02/21 1103     Exercise Goal Re-Evaluation   Exercise Goals Review Increase Physical Activity;Able to understand and use rate of perceived exertion (RPE) scale;Knowledge and understanding of Target Heart Rate Range (THRR);Understanding of Exercise Prescription;Increase Strength and Stamina;Able to understand and use Dyspnea scale;Able to check pulse independently Increase Physical Activity;Increase Strength and Stamina;Understanding of Exercise Prescription Increase Physical Activity;Increase Strength and Stamina Increase Physical Activity;Increase Strength and Stamina;Able to understand and use rate of perceived exertion (RPE) scale;Knowledge and understanding of Target Heart Rate Range (THRR);Able to check pulse independently Increase Physical Activity;Increase Strength and Stamina   Comments Reviewed RPE and dyspnea scales, THR and program prescription with pt today.  Pt voiced understanding and was given a copy of goals to take home. Ronalee Belts is off to a good start in rehab.  He is already at 23 laps on the track on his third visit.  We will continue to monitor his progress. Ronalee Belts is doing well so far.  Staff will encourag etrying 4 lb for strength work. Reviewed home exercise with pt today.  Pt plans to walk and consider  [place] for exercise.  Reviewed THR, pulse, RPE, sign and symptoms, pulse oximetery and when to call 911 or MD.  Also discussed weather considerations and indoor options.  Pt voiced understanding. Ronalee Belts is continuing to do well in rehab. He has increased to level 3 on the T5 Nustep and completed 25 laps on the track which is the most thus far. He should be encouraged to increase to 4 lbs for his handweights. Will continue to monitor   Expected Outcomes Short: Use RPE daily to regulate intensity. Long: Follow program prescription in THR. Short: Continue to attend rehab regularly Long: Continue to follow program prescription Short: try 4 lb Long: build overall stamina Short: add one day per week in addition to program sessions  Long:exercise 3-5 days per week Short: Continue to increase loads on the track and increase handweights to 4 lbs Long: Continue to increase overall MET level    Row Name 07/16/21 1332 07/19/21 0951 07/30/21 1339 08/13/21 0842 08/27/21 1028     Exercise Goal Re-Evaluation   Exercise Goals Review Increase Physical Activity;Increase Strength and Stamina Increase Physical Activity;Increase Strength and Stamina Increase Physical Activity;Increase Strength and Stamina;Understanding of Exercise Prescription Increase Physical Activity;Increase Strength and Stamina Increase Physical Activity;Increase Strength and Stamina   Comments Ronalee Belts has worked up to 30 laps on the track. Oxygen sats have been in the 90s.  He has reached lower end of THR range  .He still needs to try 4 lb weights. Mike ha smoved up to [employer].  He walked first today and alternates which he does first for variety. Ronalee Belts is doing well in rehab.  He is getting in 30 laps on the track.  We will encourage him to  try 5 lb weights.  We will continue to monitor his progress. Ronalee Belts attends consistently and reaches THR range most sessions.  He still needs to try 5 lb weights.  Staff will monitor progress. Ronalee Belts has not been to  rehab for the last couple of weeks due to having surgery and then getting sick. He should be due back soon and maintain consistent attendance.   Expected Outcomes Short: try 4 lb weights Long: improve overall stamina Short: continue to increase workloads as tolerated Long: increase average MET level Short: Try 5 lb weights Long: Continue to improve stamina Short:  move up to 5 lb weights Long: improve overall stamina Short: Resume consistent attendance Long: Continue to increase overall MET level    Row Name 08/28/21 0935 09/11/21 1320 09/18/21 0935 09/24/21 1407       Exercise Goal Re-Evaluation   Exercise Goals Review Increase Physical Activity;Increase Strength and Stamina;Understanding of Exercise Prescription Increase Physical Activity;Increase Strength and Stamina Increase Physical Activity;Increase Strength and Stamina;Understanding of Exercise Prescription Increase Physical Activity;Increase Strength and Stamina;Understanding of Exercise Prescription    Comments Ronalee Belts returned to rehab today after surgery.  He is feeling better and thinks he got the treatment as he is feeling better.  He has been walking some at home, but plans to get back to it more again now that he is feeling better. His stamina has gotten better since starting the program. Ronalee Belts attends consistently and reaches THR range.  He is up to 30 laps on the track. Staff will suggest he try 5 lb for strength work. We will continue to monitor. Ronalee Belts was out last week with a cold.  Normally he is active at home.  He uses his wife's equipment (ski machine) for cardio.  He also does some leg resistance work too.  He is feeling better this week, but feels that being sick set him back. Ronalee Belts is doing well in rehab.  He was up to 35 laps on the track last week and 23 watts on the bike.  We will continue to monitor his progress.    Expected Outcomes Short: Return to consistent exericse Long: Continue to improve stamina Short: try 5 lb Long: continue  to improve stamina Short: Get back into routine and recover Long: COnitnue to improve stamina. Short: Improve post 6MWT Long: Continue to exercise independently             Discharge Exercise Prescription (Final Exercise Prescription Changes):  Exercise Prescription Changes - 09/24/21 1400       Response to Exercise   Blood Pressure (Admit) 138/62    Blood Pressure (Exit) 126/64    Heart Rate (Admit) 103 bpm    Heart Rate (Exercise) 123 bpm    Heart Rate (Exit) 100 bpm    Oxygen Saturation (Admit) 94 %    Oxygen Saturation (Exercise) 92 %    Oxygen Saturation (Exit) 96 %    Rating of Perceived Exertion (Exercise) 11    Perceived Dyspnea (Exercise) 2    Duration Continue with 30 min of aerobic exercise without signs/symptoms of physical distress.    Intensity THRR unchanged      Progression   Progression Continue to progress workloads to maintain intensity without signs/symptoms of physical distress.    Average METs 3.07      Resistance Training   Training Prescription Yes    Weight 4 lb    Reps 10-15      Interval Training   Interval Training No  Recumbant Bike   Level 3    Watts 23    Minutes 15    METs 2.98      REL-XR   Level 3    Minutes 15    METs 4.3      T5 Nustep   Level 3    Minutes 15    METs 2.1      Track   Laps 35    Minutes 15    METs 2.9      Home Exercise Plan   Plans to continue exercise at Home (comment)   walking   Frequency Add 2 additional days to program exercise sessions.    Initial Home Exercises Provided 06/19/21      Oxygen   Maintain Oxygen Saturation 88% or higher             Nutrition:  Target Goals: Understanding of nutrition guidelines, daily intake of sodium <1545m, cholesterol <2049m calories 30% from fat and 7% or less from saturated fats, daily to have 5 or more servings of fruits and vegetables.  Education: All About Nutrition: -Group instruction provided by verbal, written material, interactive  activities, discussions, models, and posters to present general guidelines for heart healthy nutrition including fat, fiber, MyPlate, the role of sodium in heart healthy nutrition, utilization of the nutrition label, and utilization of this knowledge for meal planning. Follow up email sent as well. Written material given at graduation. [facility]  Date 05/31/21  [facility] Review Code 1- Verbalizes Understanding       Biometrics:  Pre Biometrics - 05/24/21 1201       Pre Biometrics   Height 5' 11.5" (1.816 m)    Weight 176 lb 14.4 oz (80.2 kg)    BMI (Calculated) 24.33    Single Leg Stand 30 seconds              Nutrition Therapy Plan and Nutrition Goals:  Nutrition Therapy & Goals - 06/26/21 1018       Nutrition Therapy   Diet Heart healthy, low Na, pulmonary MNT    Protein (specify units) 95g    Fiber 30 grams    Whole Grain Foods 3 servings    Saturated Fats 12 max. grams    Fruits and Vegetables 8 servings/day    Sodium 1.5 grams      Personal Nutrition Goals   Nutrition Goal ST: have 1 good protein source at each meal/snack LT: limit Na <1.5g, meet protein and calorie needs consistently    Comments He has surgeries on his lungs in November - it is experimental and he may be a part of the placebo group. He feels short of breath after cooking. He has small frequent meals (4-6 meals) and has some shortness of breath. B: biscuit with country ham L: pot pie D: shrimp or fish 1x/week, beef stew, pork with rice and potatoes and salads and other green vegetables. He gets a good variety of vegetables. He adds spices and not much salt, bacon grease sometimes, olive oil mostly with cooking. He enjoys cheese and eggs as well, he has peanuts as snack with sundrop. Drinks: 1 gallon of milk every two day, sundrop, sometimes rum and coke. Discussed heart healthy eating and pulmonary MNT.       Intervention Plan   Intervention Prescribe, educate and counsel regarding individualized specific dietary modifications aiming towards targeted core components such as weight, hypertension, lipid  management, diabetes, heart failure and other comorbidities.    Expected Outcomes Short Term Goal: Understand basic principles of dietary content, such as calories, fat, sodium, cholesterol and nutrients.;Short Term Goal: A plan has been developed with personal nutrition goals set during dietitian appointment.;Long Term Goal: Adherence to prescribed nutrition plan.             Nutrition Assessments:  MEDIFICTS Score Key: ?70 Need to make dietary changes  40-70 Heart Healthy Diet ? 40 Therapeutic Level Cholesterol Diet  Flowsheet Row Pulmonary Rehab from [facility]/24/2021 in [facility] Card[facility] and Pulmonary Rehab  Picture Your Plate Total Score on Admission 54      Picture Your Plate Scores: <66 Unhealthy dietary pattern with much room for improvement. 41-50 Dietary pattern unlikely to meet recommendations for good health and room for improvement. 51-60 More healthful dietary pattern, with some room for improvement.  >60 Healthy dietary pattern, although there may be some specific behaviors that could be improved.   Nutrition Goals Re-Evaluation:  Nutrition Goals Re-Evaluation     [place] Name 07/19/21 [phone] 08/28/21 0939 09/18/21 0938         Goals   Nutrition Goal -- ST: have 1 good protein source at each meal/snack LT: limit Na <1.5g, meet protein and calorie needs consistently ST: have 1 good protein source at each meal/snack LT: limit Na <1.5g, meet protein and calorie needs consistently     Comment Ronalee Belts has cut back on red meat and eats more fish.  He does eat green vegetables.  He is using frozen vegetables more than canned to reduce sodium. Ronalee Belts is doing well with his diet.  His wife has gotten back to cooking again.  They are getting in lots of fruits and vegetables and cutting back on sodium.   They are eating more fish than ever before.  He is doing well with the holiday goodies as he does not have a big sweet tooth. Ronalee Belts is still doing well with his diet.  He is eating like he supposed to.  He is doing better with his protein especially fish.  He is also trying to limit his sodium.  He likes whitefish and catfish.     Expected Outcome ST/LT:  continue heart healthy diet Short: Continue to watch sodium Long: Continue to get in a variety of food Short: Continue to aim for more protein Long: Continue to limit sodium.              Nutrition Goals Discharge (Final Nutrition Goals Re-Evaluation):  Nutrition Goals Re-Evaluation - 09/18/21 6546       Goals   Nutrition Goal ST: have 1 good protein source at each meal/snack LT: limit Na <1.5g, meet protein and calorie needs consistently    Comment Ronalee Belts is still doing well with his diet.  He is eating like he supposed to.  He is doing better with his protein especially fish.  He is also trying to limit his sodium.  He likes whitefish and catfish.    Expected Outcome Short: Continue to aim for more protein Long: Continue to limit sodium.             Psychosocial: Target Goals: Acknowledge presence or absence of significant depression and/or stress, maximize coping skills, provide positive support system. Participant is able to verbalize types and ability to use techniques and skills needed for reducing stress and depression.   Education: Stress, Anxiety, and Depression - Group verbal and visual presentation to define topics covered.  Reviews how  body is impacted by stress, anxiety, and depression.  Also discusses healthy ways to reduce stress and to treat/manage anxiety and depression.  Written material given at graduation. Flowsheet Row Pulmonary Rehab from 09/27/2021 in [facility] Cardiac and Pulmonary Rehab  Date 06/21/21  Educator [facility]  Instruction Review Code 1- [employer] Understanding       Education: Sleep Hygiene -Provides group  verbal and written instruction about how sleep can affect your health.  Define sleep hygiene, discuss sleep cycles and impact of sleep habits. Review good sleep hygiene tips.    Initial Review & Psychosocial Screening:  Initial Psych Review & Screening - 05/18/21 1025       Initial Review   Current issues with None Identified      Family Dynamics   Good Support System? Yes   wife, few friends     Barriers   Psychosocial barriers to participate in program There are no identifiable barriers or psychosocial needs.;The patient should benefit from training in stress management and relaxation.      Screening Interventions   Interventions Encouraged to exercise;Provide feedback about the scores to participant;To provide support and resources with identified psychosocial needs    Expected Outcomes Short Term goal: Utilizing psychosocial counselor, staff and physician to assist with identification of specific Stressors or current issues interfering with healing process. Setting desired goal for each stressor or current issue identified.;Long Term Goal: Stressors or current issues are controlled or eliminated.;Short Term goal: Identification and review with participant of any Quality of Life or Depression concerns found by scoring the questionnaire.;Long Term goal: The participant improves quality of Life and PHQ9 Scores as seen by post scores and/or verbalization of changes             Quality of Life Scores:  Scores of 19 and below usually indicate a poorer quality of life in these areas.  A difference of  2-3 points is a clinically meaningful difference.  A difference of 2-3 points in the total score of the Quality of Life Index has been associated with significant improvement in overall quality of life, self-image, physical symptoms, and general health in studies assessing change in quality of life.  PHQ-9: Recent Review Flowsheet Data     Depression screen [facility] 2/9 06/21/2021 05/24/2021    Decreased Interest 1 1   Down, Depressed, Hopeless 0 0   PHQ - 2 Score 1 1   Altered sleeping 0 1   Tired, decreased energy 1 2   Change in appetite 0 0   Feeling bad or failure about yourself  0 0   Trouble concentrating 0 0   Moving slowly or fidgety/restless 1 1   Suicidal thoughts 0 0   PHQ-9 Score 3 5   Difficult doing work/chores Somewhat difficult  Not difficult at all      Interpretation of Total Score  Total Score Depression Severity:  1-4 = Minimal depression, 5-9 = Mild depression, 10-14 = Moderate depression, 15-19 = Moderately severe depression, 20-27 = Severe depression   Psychosocial Evaluation and Intervention:  Psychosocial Evaluation - 06/19/21 0941       Psychosocial Evaluation & Interventions   Comments Ronalee Belts states he has no symptoms of anxiety or depression.  He remains tobacco free in anticipation of the procedure at [facility].  He was able to walk all the way in today which was one of his goals when he started the program.    Expected Outcomes Short: continue to exercise to help manage stress Long:  maintain positive outlook             Psychosocial Re-Evaluation:  Psychosocial Re-Evaluation     Row Name 07/19/21 [phone] 08/28/21 6333 09/18/21 0936         Psychosocial Re-Evaluation   Current issues with -- Current Stress Concerns Current Stress Concerns     Comments Patient states that his mood has been good and has not been feeling depressed. He states no changes in mental status Ronalee Belts is doing well in rehab.  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  He is thinking he got the treatment as he is starting to feel better.  However, he did have a little scare after this last surgery as he started to cough up blood.  That is now doing better and he feels good again.  He deneis any other major stressors at this time.  He is still sleeping well.  He is eager to get back to his exercise routine again too. Last week Ronalee Belts was out with a  cold that really set him back and knocked out his breathing.  Overall, though, he is doing well mentally.  He has been keeping a diary with his procedures and he has noticed that he has stayed pretty positive thoughout.  He tries not to let anything really get to him.  He only gets frustrated when he can't breathe.     Expected Outcomes Short: Attend [facility] stress management education to decrease stress. Long: Maintain exercise Post [facility] to keep stress at a minimum. Short: Continue to exercise for mental boost Long: Conitnue to stay positive Short: Get back to routine again. Long: COntinue to focus on positive     Interventions -- Encouraged to attend Pulmonary Rehabilitation for the exercise Encouraged to attend Pulmonary Rehabilitation for the exercise     Continue Psychosocial Services  -- Follow up required by staff --              Psychosocial Discharge (Final Psychosocial Re-Evaluation):  Psychosocial Re-Evaluation - 09/18/21 0936       Psychosocial Re-Evaluation   Current issues with Current Stress Concerns    Comments Last week Ronalee Belts was out with a cold that really set him back and knocked out his breathing.  Overall, though, he is doing well mentally.  He has been keeping a diary with his procedures and he has noticed that he has stayed pretty positive thoughout.  He tries not to let anything really get to him.  He only gets frustrated when he can't breathe.    Expected Outcomes Short: Get back to routine again. Long: COntinue to focus on positive    Interventions Encouraged to attend Pulmonary Rehabilitation for the exercise             Education: Education Goals: Education classes will be provided on a weekly basis, covering required topics. Participant will state understanding/return demonstration of topics presented.  Learning Barriers/Preferences:  Learning Barriers/Preferences - 05/18/21 1027       Learning Barriers/Preferences   Learning Barriers None     Learning Preferences None             General Pulmonary Education Topics:  Infection Prevention: - Provides verbal and written material to individual with discussion of infection control including proper hand washing and proper equipment cleaning during exercise session. Flowsheet Row Pulmonary Rehab from 09/27/2021 in [facility] Cardiac and Pulmonary Rehab  Education need identified 05/24/21  Date 05/24/21  Educator Craig Staggers  Instruction Review Code 1- Verbalizes Understanding       Falls Prevention: - Provides verbal and written material to individual with discussion of falls prevention and safety. Flowsheet Row Pulmonary Rehab from 09/27/2021 in [facility] Cardiac and Pulmonary Rehab  Education need identified 05/24/21  Date 05/24/21  Educator [place]  Instruction Review Code 1- Verbalizes Understanding       Chronic Lung Disease Review: - Group verbal instruction with posters, models, PowerPoint presentations and videos,  to review new updates, new respiratory medications, new advancements in procedures and treatments. Providing information on websites and "800" numbers for continued self-education. Includes information about supplement oxygen, available portable oxygen systems, continuous and intermittent flow rates, oxygen safety, concentrators, and Medicare reimbursement for oxygen. Explanation of Pulmonary Drugs, including class, frequency, complications, importance of spacers, rinsing mouth after steroid MDI's, and proper cleaning methods for nebulizers. Review of basic lung anatomy and physiology related to function, structure, and complications of lung disease. Review of risk factors. Discussion about methods for diagnosing sleep apnea and types of masks and machines for OSA. Includes a review of the use of types of environmental controls: home humidity, furnaces, filters, dust mite/pet prevention, HEPA vacuums. Discussion about weather changes, air quality and the benefits of nasal washing.  Instruction on Warning signs, infection symptoms, calling MD promptly, preventive modes, and value of vaccinations. Review of effective airway clearance, coughing and/or vibration techniques. Emphasizing that all should Create an Action Plan. Written material given at graduation. Flowsheet Row Pulmonary Rehab from 09/27/2021 in [facility] Cardiac and Pulmonary Rehab  Education need identified 05/24/21       AED/CPR: - Group verbal and written instruction with the use of models to demonstrate the basic use of the AED with the basic ABC's of resuscitation.    Anatomy and Cardiac Procedures: - Group verbal and visual presentation and models provide information about basic cardiac anatomy and function. Reviews the testing methods done to diagnose heart disease and the outcomes of the test results. Describes the treatment choices: Medical Management, Angioplasty, or Coronary Bypass Surgery for treating various heart conditions including Myocardial Infarction, Angina, Valve Disease, and Cardiac Arrhythmias.  Written material given at graduation.   Medication Safety: - Group verbal and visual instruction to review commonly prescribed medications for heart and lung disease. Reviews the medication, class of the drug, and side effects. Includes the steps to properly store meds and maintain the prescription regimen.  Written material given at graduation. Flowsheet Row Pulmonary Rehab from 09/27/2021 in [facility] Cardiac and Pulmonary Rehab  Date 09/27/21  Educator [facility]  Instruction Review Code 1- Verbalizes Understanding       Other: -Provides group and verbal instruction on various topics (see comments)   Knowledge Questionnaire Score:  Knowledge Questionnaire Score - 05/24/21 1208       Knowledge Questionnaire Score   Pre Score 17/18: Oxygen              Core Components/Risk Factors/Patient Goals at Admission:  Personal Goals and Risk Factors at Admission - 05/24/21 1418       Core  Components/Risk Factors/Patient Goals on Admission    Weight Management Yes;Weight Loss   Patient goal; BMI < 25   Intervention Weight Management: Develop a combined nutrition and exercise program designed to reach desired caloric intake, while maintaining appropriate intake of nutrient and fiber, sodium and fats, and appropriate energy expenditure required for the weight goal.;Weight Management: Provide education and appropriate resources to help participant work on and attain dietary goals.;Weight Management/Obesity:  Establish reasonable short term and long term weight goals.    Admit Weight 176 lb (79.8 kg)    Goal Weight: Short Term 171 lb (77.6 kg)    Goal Weight: Long Term 165 lb (74.8 kg)    Expected Outcomes Short Term: Continue to assess and modify interventions until short term weight is achieved;Long Term: Adherence to nutrition and physical activity/exercise program aimed toward attainment of established weight goal;Weight Loss: Understanding of general recommendations for a balanced deficit meal plan, which promotes 1-2 lb weight loss per week and includes a negative energy balance of [phone] kcal/d;Understanding recommendations for meals to include 15-35% energy as protein, 25-35% energy from fat, 35-60% energy from carbohydrates, less than 250m of dietary cholesterol, 20-35 gm of total fiber daily;Understanding of distribution of calorie intake throughout the day with the consumption of 4-5 meals/snacks    Tobacco Cessation Yes    Number of packs per day MSamarthhas recently quit tobacco use within the last 6 months. Intervention for relapse prevention was provided at the initial medical review. He was encouraged to continue to with tobacco cessation and was provided information on relapse prevention. Patient received information about combination therapy, tobacco cessation classes, quit line, and quit smoking apps in case of a relapse. Patient demonstrated understanding of this  material.Staff will continue to provide encouragement and follow up with the patient throughout the program.    Intervention Assist the participant in steps to quit. Provide individualized education and counseling about committing to Tobacco Cessation, relapse prevention, and pharmacological support that can be provided by physician.;OAdvice worker assist with locating and accessing local/national Quit Smoking programs, and support quit date choice.    Expected Outcomes Short Term: Will demonstrate readiness to quit, by selecting a quit date.;Short Term: Will quit all tobacco product use, adhering to prevention of relapse plan.;Long Term: Complete abstinence from all tobacco products for at least 12 months from quit date.    Improve shortness of breath with ADL's Yes    Intervention Provide education, individualized exercise plan and daily activity instruction to help decrease symptoms of SOB with activities of daily living.    Expected Outcomes Short Term: Improve cardiorespiratory fitness to achieve a reduction of symptoms when performing ADLs;Long Term: Be able to perform more ADLs without symptoms or delay the onset of symptoms    Increase knowledge of respiratory medications and ability to use respiratory devices properly  Yes    Intervention Provide education and demonstration as needed of appropriate use of medications, inhalers, and oxygen therapy.    Expected Outcomes Short Term: Achieves understanding of medications use. Understands that oxygen is a medication prescribed by physician. Demonstrates appropriate use of inhaler and oxygen therapy.;Long Term: Maintain appropriate use of medications, inhalers, and oxygen therapy.    Intervention Provide education on lifestyle modifcations including regular physical activity/exercise, weight management, moderate sodium restriction and increased consumption of fresh fruit, vegetables, and low fat dairy, alcohol moderation, and smoking  cessation.;Monitor prescription use compliance.    Expected Outcomes Short Term: Continued assessment and intervention until BP is < 140/962mHG in hypertensive participants. < 130/807mG in hypertensive participants with diabetes, heart failure or chronic kidney disease.;Long Term: Maintenance of blood pressure at goal levels.             Education:Diabetes - Individual verbal and written instruction to review signs/symptoms of diabetes, desired ranges of glucose level fasting, after meals and with exercise. Acknowledge that pre and post exercise glucose checks will be done  for 3 sessions at entry of program.   Know Your Numbers and Heart Failure: - Group verbal and visual instruction to discuss disease risk factors for cardiac and pulmonary disease and treatment options.  Reviews associated critical values for Overweight/Obesity, Hypertension, Cholesterol, and Diabetes.  Discusses basics of heart failure: signs/symptoms and treatments.  Introduces Heart Failure Zone chart for action plan for heart failure.  Written material given at graduation.   Core Components/Risk Factors/Patient Goals Review:   Goals and Risk Factor Review     Row Name 06/19/21 0939 07/19/21 0936 08/28/21 0941 09/18/21 0951       Core Components/Risk Factors/Patient Goals Review   Personal Goals Review Tobacco Cessation;Develop more efficient breathing techniques such as purse lipped breathing and diaphragmatic breathing and practicing self-pacing with activity.;Improve shortness of breath with ADL's;Hypertension Tobacco Cessation;Improve shortness of breath with ADL's;Develop more efficient breathing techniques such as purse lipped breathing and diaphragmatic breathing and practicing self-pacing with activity. Tobacco Cessation;Improve shortness of breath with ADL's;Develop more efficient breathing techniques such as purse lipped breathing and diaphragmatic breathing and practicing self-pacing with activity.;Weight  Management/Obesity Tobacco Cessation;Improve shortness of breath with ADL's;Weight Management/Obesity;Hypertension;Increase knowledge of respiratory medications and ability to use respiratory devices properly.    Review Ronalee Belts is still not smoking in anticipation of a surgery to help his lungs/breathing.  It is an experimental procedure.  He is taking meds as directed.  He uses PLB when needed. Ronalee Belts remains tobacco free.  He has had half of the experimental procedure and has the other half scheduled for 12/7.  He says hi sbreathing seems to be a little better.  He can do more wihtout having to rest.  He feels this is from exercise.  He does monitor oxygen and BP at home.  BP averages 125-135/70-80.  Oxygen stays above 88%.  He uses PLB and feels comfortable with it. Ronalee Belts is doing well.  Still tobacco free!  His weight is steady and blood pressures continue to do well.  He is starting to feel like he is breathing better and doing well on his meds. Ronalee Belts is doing well in rehab.  This past week he lost some more weight. He is still tobacco free. His pressures are still doing well.  While he was sick, he really struggled with breathing.  Now, he is starting to feel better.  He is did use his nebulizer to help with breathing.    Expected Outcomes Short:  maintain tobacco free Long: become proficient at PLB Short: maintain tobacco free status Long: continue to monitor BP and oxygen at home Short: Continue to work on breathing Long: Conitnue to manage breathing better Short: Continue to improve breathing again.  Long: Continue to manage risk factors.             Core Components/Risk Factors/Patient Goals at Discharge (Final Review):   Goals and Risk Factor Review - 09/18/21 0951       Core Components/Risk Factors/Patient Goals Review   Personal Goals Review Tobacco Cessation;Improve shortness of breath with ADL's;Weight Management/Obesity;Hypertension;Increase knowledge of respiratory medications and ability to  use respiratory devices properly.    Review Ronalee Belts is doing well in rehab.  This past week he lost some more weight. He is still tobacco free. His pressures are still doing well.  While he was sick, he really struggled with breathing.  Now, he is starting to feel better.  He is did use his nebulizer to help with breathing.    Expected Outcomes Short: Continue to  improve breathing again.  Long: Continue to manage risk factors.             ITP Comments:  ITP Comments     Row Name 05/18/21 1049 05/24/21 1407 05/29/21 0925 06/13/21 1411 06/26/21 1109   ITP Comments Virtual orientation call completed today. he has an appointment on Date: 05/24/2021  for EP eval and gym Orientation.  Documentation of diagnosis can be found in [facility] Date: 08/02/2022[name] has recently quit tobacco use within the last 6 months. Intervention for relapse prevention was provided at the initial medical review. He was encouraged to continue to with tobacco cessation and was provided information on relapse prevention. Patient received information about combination therapy, tobacco cessation classes, quit line, and quit smoking apps in case of a relapse. Patient demonstrated understanding of this material.Staff will continue to provide encouragement and follow up with the patient throughout the program. Completed 6MWT and gym orientation. Initial ITP created and sent for review to Dr. Ottie Glazier, Medical Director. First full day of exercise!  Patient was oriented to gym and equipment including functions, settings, policies, and procedures.  Patient's individual exercise prescription and treatment plan were reviewed.  All starting workloads were established based on the results of the 6 minute walk test done at initial orientation visit.  The plan for exercise progression was also introduced and progression will be customized based on patient's performance and goals. 30 day review completed. ITP sent to Dr. Zetta Bills, Medical  Director of  Pulmonary Rehab. Continue with ITP unless changes are made by physician. Completed initial RD consultation    [place] Name 07/11/21 0804 08/08/21 0746 08/27/21 1035 09/05/21 0837 10/03/21 0919   ITP Comments 30 Day review completed. Medical Director ITP review done, changes made as directed, and signed approval by Medical Director. 30 Day review completed. Medical Director ITP review done, changes made as directed, and signed approval by Medical Director. Ronalee Belts has been out due to having surgery and then getting sick. He should be due to return back to rehab soon. 30 Day review completed. Medical Director ITP review done, changes made as directed, and signed approval by Medical Director. 30 Day review completed. Medical Director ITP review done, changes made as directed, and signed approval by Medical Director.            Comments:

## 2021-10-04 ENCOUNTER — Other Ambulatory Visit: Payer: Self-pay

## 2021-10-04 DIAGNOSIS — J449 Chronic obstructive pulmonary disease, unspecified: Secondary | ICD-10-CM

## 2021-10-04 NOTE — Progress Notes (Signed)
Daily Session Note  Patient Details  Name: Isaac Harrison MRN: [id number] Date of Birth: Feb 03, 1956 Referring Provider:   Flowsheet Row Pulmonary Rehab from 05/24/2021 in [facility] Cardiac and Pulmonary Rehab  Referring Provider [name] [facility])       Encounter Date: 10/04/2021  Check In:  Session Check In - 10/04/21 0912       Check-In   Supervising physician immediately available to respond to emergencies See telemetry face sheet for immediately available ER MD    Location [facility]-Cardiac & Pulmonary Rehab    Staff Present [name], MPA, RN;Joseph Linwood, Jaci Carrel, BS, ACSM CEP, Exercise Physiologist;Amanda Oletta Darter, [place], ACSM CEP, Exercise Physiologist    Virtual Visit No    Medication changes reported     No    Fall or balance concerns reported    No    Warm-up and Cool-down Performed on first and last piece of equipment    Resistance Training Performed Yes    VAD Patient? No    PAD/SET Patient? No      Pain Assessment   Currently in Pain? No/denies                Social History   Tobacco Use  Smoking Status Former   Packs/day: 1.50   Years: 35.00   Pack years: 52.50   Types: Cigarettes   Quit date: 03/09/2021   Years since quitting: 0.5  Smokeless Tobacco Never  Tobacco Comments   Quit   not smoking any at all    Goals Met:  Independence with exercise equipment Exercise tolerated well No report of concerns or symptoms today Strength training completed today  Goals Unmet:  Not Applicable  Comments: Pt able to follow exercise prescription today without complaint.  Will continue to monitor for progression.    Dr. Emily Filbert is Medical Director for [place].  Dr. Ottie Glazier is Medical Director for [facility] Pulmonary Rehabilitation.

## 2021-10-09 ENCOUNTER — Other Ambulatory Visit: Payer: Self-pay

## 2021-10-09 VITALS — Ht 71.5 in | Wt 174.6 lb

## 2021-10-09 DIAGNOSIS — J449 Chronic obstructive pulmonary disease, unspecified: Secondary | ICD-10-CM | POA: Diagnosis not present

## 2021-10-09 NOTE — Progress Notes (Signed)
Daily Session Note  Patient Details  Name: Isaac Harrison MRN: [id number] Date of Birth: [birth year] Referring Provider:   Flowsheet Row Pulmonary Rehab from 05/24/2021 in [facility] Cardiac and Pulmonary Rehab  Referring Provider [name] [facility])       Encounter Date: 10/09/2021  Check In:  Session Check In - 10/09/21 0915       Check-In   Supervising physician immediately available to respond to emergencies See telemetry face sheet for immediately available ER MD    Location [facility]-Cardiac & Pulmonary Rehab    Staff Present [name], MPA, RN;Amanda Sommer, BA, ACSM CEP, Exercise Physiologist;Jessica Luan Pulling, MA, RCEP, CCRP, CCET    Virtual Visit No    Medication changes reported     No    Fall or balance concerns reported    No    Warm-up and Cool-down Performed on first and last piece of equipment    Resistance Training Performed Yes    VAD Patient? No    PAD/SET Patient? No      Pain Assessment   Currently in Pain? No/denies                Social History   Tobacco Use  Smoking Status Former   Packs/day: 1.50   Years: 35.00   Pack years: 52.50   Types: Cigarettes   Quit date: 03/09/2021   Years since quitting: 0.5  Smokeless Tobacco Never  Tobacco Comments   Quit   not smoking any at all    Goals Met:  Independence with exercise equipment Exercise tolerated well No report of concerns or symptoms today Strength training completed today  Goals Unmet:  Not Applicable  Comments: Pt able to follow exercise prescription today without complaint.  Will continue to monitor for progression.   Isaac Harrison Name 05/24/21 1408 10/09/21 0936       6 Minute Walk   Phase Initial Discharge    Distance 975 feet 1355 feet    Distance % Change -- 39 %    Distance Feet Change -- 380 ft    Walk Time 6 minutes 6 minutes    # of Rest Breaks 0 0    MPH 1.84 2.57    METS 3.02 3.74    RPE 11 13    Perceived Dyspnea  2 3    VO2 Peak 10.57 13.1     Symptoms Yes (comment) No    Comments SOB SOB (sinuses clogged)    Resting HR 71 bpm 79 bpm    Resting BP 144/74 112/72    Resting Oxygen Saturation  95 % 95 %    Exercise Oxygen Saturation  during 6 min walk 89 % 90 %    Max Ex. HR 100 bpm 116 bpm    Max Ex. BP 164/76 146/64    2 Minute Post BP 144/74 122/66      Interval HR   1 Minute HR 92 108    2 Minute HR 93 106    3 Minute HR 95 111    4 Minute HR 100 113    5 Minute HR 97 113    6 Minute HR 98 116    2 Minute Post HR 78 105    Interval Heart Rate? Yes Yes      Interval Oxygen   Interval Oxygen? Yes Yes    Baseline Oxygen Saturation % 95 % 95 %    1 Minute Oxygen Saturation % 92 %  95 %    1 Minute Liters of Oxygen 0 L  RA 0 L  Room Air    2 Minute Oxygen Saturation % 93 % 92 %    2 Minute Liters of Oxygen 0 L 0 L    3 Minute Oxygen Saturation % 91 % 91 %    3 Minute Liters of Oxygen 0 L 0 L    4 Minute Oxygen Saturation % 89 % 91 %    4 Minute Liters of Oxygen 0 L 0 L    5 Minute Oxygen Saturation % 90 % 90 %    5 Minute Liters of Oxygen 0 L 0 L    6 Minute Oxygen Saturation % 89 % 90 %    6 Minute Liters of Oxygen 0 L 0 L    2 Minute Post Oxygen Saturation % 96 % 95 %    2 Minute Post Liters of Oxygen 0 L 0 L              Dr. Emily Filbert is Medical Director for [place].  Dr. Ottie Glazier is Medical Director for [facility] Pulmonary Rehabilitation.

## 2021-10-11 ENCOUNTER — Other Ambulatory Visit: Payer: Self-pay

## 2021-10-11 ENCOUNTER — Encounter: Payer: No Typology Code available for payment source | Attending: Pulmonary Disease

## 2021-10-11 DIAGNOSIS — J449 Chronic obstructive pulmonary disease, unspecified: Secondary | ICD-10-CM | POA: Diagnosis not present

## 2021-10-11 NOTE — Progress Notes (Signed)
Daily Session Note  Patient Details  Name: Isaac Harrison MRN: [id number] Date of Birth: [birth year] Referring Provider:   Flowsheet Row Pulmonary Rehab from 05/24/2021 in [facility] Cardiac and Pulmonary Rehab  Referring Provider [name] [facility])       Encounter Date: 10/11/2021  Check In:  Session Check In - 10/11/21 0910       Check-In   Supervising physician immediately available to respond to emergencies See telemetry face sheet for immediately available ER MD    Location [facility]-Cardiac & Pulmonary Rehab    Staff Present [name], MPA, [place], BS, ACSM CEP, Exercise Physiologist;Amanda Oletta Darter, BA, ACSM CEP, Exercise Physiologist    Virtual Visit No    Medication changes reported     No    Fall or balance concerns reported    No    Warm-up and Cool-down Performed on first and last piece of equipment    Resistance Training Performed Yes    VAD Patient? No    PAD/SET Patient? No      Pain Assessment   Currently in Pain? No/denies                Social History   Tobacco Use  Smoking Status Former   Packs/day: 1.50   Years: 35.00   Pack years: 52.50   Types: Cigarettes   Quit date: 03/09/2021   Years since quitting: 0.5  Smokeless Tobacco Never  Tobacco Comments   Quit   not smoking any at all    Goals Met:  Independence with exercise equipment Exercise tolerated well No report of concerns or symptoms today Strength training completed today  Goals Unmet:  Not Applicable  Comments: Pt able to follow exercise prescription today without complaint.  Will continue to monitor for progression.    Dr. Emily Filbert is Medical Director for [place].  Dr. Ottie Glazier is Medical Director for [facility] Pulmonary Rehabilitation.

## 2021-10-11 NOTE — Patient Instructions (Signed)
Discharge Patient Instructions  Patient Details  Name: Isaac Harrison MRN: [id number] Date of Birth: 12-09-1955 Referring Provider:  [facility]   Number of Visits: 19  Reason for Discharge:  Patient reached a stable level of exercise. Patient independent in their exercise. Patient has met program and personal goals.  Smoking History:  Social History   Tobacco Use  Smoking Status Former   Packs/day: 1.50   Years: 35.00   Pack years: 52.50   Types: Cigarettes   Quit date: 03/09/2021   Years since quitting: 0.5  Smokeless Tobacco Never  Tobacco Comments   Quit   not smoking any at all    Diagnosis:  Chronic obstructive pulmonary disease, unspecified COPD type (HCC)  Initial Exercise Prescription:  Initial Exercise Prescription - 05/24/21 1400       Date of Initial Exercise RX and Referring Provider   Date 05/24/21    Referring Provider Nena [facility] ([facility])      Recumbant Bike   Level 1    RPM 60    Watts 25    Minutes 15    METs 3      REL-XR   Level 1    Speed 50    Minutes 15    METs 3      T5 Nustep   Level 1    SPM 80    Minutes 15    METs 3      Track   Laps 17    Minutes 15    METs 1.92      Prescription Details   Frequency (times per week) 2    Duration Progress to 30 minutes of continuous aerobic without signs/symptoms of physical distress      Intensity   THRR 40-80% of Max Heartrate 103-136    Ratings of Perceived Exertion 11-13    Perceived Dyspnea 0-4      Progression   Progression Continue to progress workloads to maintain intensity without signs/symptoms of physical distress.      Resistance Training   Training Prescription Yes    Weight 3 lb    Reps 10-15             Discharge Exercise Prescription (Final Exercise Prescription Changes):  Exercise Prescription Changes - 10/08/21 1100       Response to Exercise   Blood Pressure (Admit) 138/72    Blood Pressure (Exit) 122/70    Heart Rate (Admit) 105 bpm     Heart Rate (Exercise) 122 bpm    Heart Rate (Exit) 110 bpm    Oxygen Saturation (Admit) 94 %    Oxygen Saturation (Exercise) 90 %    Oxygen Saturation (Exit) 94 %    Rating of Perceived Exertion (Exercise) 11    Perceived Dyspnea (Exercise) 2    Duration Continue with 30 min of aerobic exercise without signs/symptoms of physical distress.    Intensity THRR unchanged      Progression   Progression Continue to progress workloads to maintain intensity without signs/symptoms of physical distress.    Average METs 2.74      Resistance Training   Training Prescription Yes    Weight 4 lb    Reps 10-15      Interval Training   Interval Training No      REL-XR   Level 3    Minutes 15      Track   Laps 32    Minutes 15    METs 2.74  Home Exercise Plan   Plans to continue exercise at Home (comment)   walking   Frequency Add 2 additional days to program exercise sessions.    Initial Home Exercises Provided 06/19/21             Functional Capacity:  6 Minute Walk     Row Name 05/24/21 1408 10/09/21 0936       6 Minute Walk   Phase Initial Discharge    Distance 975 feet 1355 feet    Distance % Change -- 39 %    Distance Feet Change -- 380 ft    Walk Time 6 minutes 6 minutes    # of Rest Breaks 0 0    MPH 1.84 2.57    METS 3.02 3.74    RPE 11 13    Perceived Dyspnea  2 3    VO2 Peak 10.57 13.1    Symptoms Yes (comment) No    Comments SOB SOB (sinuses clogged)    Resting HR 71 bpm 79 bpm    Resting BP 144/74 112/72    Resting Oxygen Saturation  95 % 95 %    Exercise Oxygen Saturation  during 6 min walk 89 % 90 %    Max Ex. HR 100 bpm 116 bpm    Max Ex. BP 164/76 146/64    2 Minute Post BP 144/74 122/66      Interval HR   1 Minute HR 92 108    2 Minute HR 93 106    3 Minute HR 95 111    4 Minute HR 100 113    5 Minute HR 97 113    6 Minute HR 98 116    2 Minute Post HR 78 105    Interval Heart Rate? Yes Yes      Interval Oxygen   Interval Oxygen?  Yes Yes    Baseline Oxygen Saturation % 95 % 95 %    1 Minute Oxygen Saturation % 92 % 95 %    1 Minute Liters of Oxygen 0 L  RA 0 L  Room Air    2 Minute Oxygen Saturation % 93 % 92 %    2 Minute Liters of Oxygen 0 L 0 L    3 Minute Oxygen Saturation % 91 % 91 %    3 Minute Liters of Oxygen 0 L 0 L    4 Minute Oxygen Saturation % 89 % 91 %    4 Minute Liters of Oxygen 0 L 0 L    5 Minute Oxygen Saturation % 90 % 90 %    5 Minute Liters of Oxygen 0 L 0 L    6 Minute Oxygen Saturation % 89 % 90 %    6 Minute Liters of Oxygen 0 L 0 L    2 Minute Post Oxygen Saturation % 96 % 95 %    2 Minute Post Liters of Oxygen 0 L 0 L             Quality of Life:   Personal Goals: Goals established at orientation with interventions provided to work toward goal.  Personal Goals and Risk Factors at Admission - 05/24/21 1418       Core Components/Risk Factors/Patient Goals on Admission    Weight Management Yes;Weight Loss   Patient goal; BMI < 25   Intervention Weight Management: Develop a combined nutrition and exercise program designed to reach desired caloric intake, while maintaining appropriate intake of nutrient and  fiber, sodium and fats, and appropriate energy expenditure required for the weight goal.;Weight Management: Provide education and appropriate resources to help participant work on and attain dietary goals.;Weight Management/Obesity: Establish reasonable short term and long term weight goals.    Admit Weight 176 lb (79.8 kg)    Goal Weight: Short Term 171 lb (77.6 kg)    Goal Weight: Long Term 165 lb (74.8 kg)    Expected Outcomes Short Term: Continue to assess and modify interventions until short term weight is achieved;Long Term: Adherence to nutrition and physical activity/exercise program aimed toward attainment of established weight goal;Weight Loss: Understanding of general recommendations for a balanced deficit meal plan, which promotes 1-2 lb weight loss per week and  includes a negative energy balance of [phone] kcal/d;Understanding recommendations for meals to include 15-35% energy as protein, 25-35% energy from fat, 35-60% energy from carbohydrates, less than $RemoveB'200mg'IKcykkNZ$  of dietary cholesterol, 20-35 gm of total fiber daily;Understanding of distribution of calorie intake throughout the day with the consumption of 4-5 meals/snacks    Tobacco Cessation Yes    Number of packs per day Sir has recently quit tobacco use within the last 6 months. Intervention for relapse prevention was provided at the initial medical review. He was encouraged to continue to with tobacco cessation and was provided information on relapse prevention. Patient received information about combination therapy, tobacco cessation classes, quit line, and quit smoking apps in case of a relapse. Patient demonstrated understanding of this material.Staff will continue to provide encouragement and follow up with the patient throughout the program.    Intervention Assist the participant in steps to quit. Provide individualized education and counseling about committing to Tobacco Cessation, relapse prevention, and pharmacological support that can be provided by physician.;Advice worker, assist with locating and accessing local/national Quit Smoking programs, and support quit date choice.    Expected Outcomes Short Term: Will demonstrate readiness to quit, by selecting a quit date.;Short Term: Will quit all tobacco product use, adhering to prevention of relapse plan.;Long Term: Complete abstinence from all tobacco products for at least 12 months from quit date.    Improve shortness of breath with ADL's Yes    Intervention Provide education, individualized exercise plan and daily activity instruction to help decrease symptoms of SOB with activities of daily living.    Expected Outcomes Short Term: Improve cardiorespiratory fitness to achieve a reduction of symptoms when performing ADLs;Long Term:  Be able to perform more ADLs without symptoms or delay the onset of symptoms    Increase knowledge of respiratory medications and ability to use respiratory devices properly  Yes    Intervention Provide education and demonstration as needed of appropriate use of medications, inhalers, and oxygen therapy.    Expected Outcomes Short Term: Achieves understanding of medications use. Understands that oxygen is a medication prescribed by physician. Demonstrates appropriate use of inhaler and oxygen therapy.;Long Term: Maintain appropriate use of medications, inhalers, and oxygen therapy.    Intervention Provide education on lifestyle modifcations including regular physical activity/exercise, weight management, moderate sodium restriction and increased consumption of fresh fruit, vegetables, and low fat dairy, alcohol moderation, and smoking cessation.;Monitor prescription use compliance.    Expected Outcomes Short Term: Continued assessment and intervention until BP is < 140/81mm HG in hypertensive participants. < 130/44mm HG in hypertensive participants with diabetes, heart failure or chronic kidney disease.;Long Term: Maintenance of blood pressure at goal levels.              Personal Goals Discharge:  Goals and Risk Factor Review - 10/09/21 0950       Core Components/Risk Factors/Patient Goals Review   Personal Goals Review Tobacco Cessation;Improve shortness of breath with ADL's;Weight Management/Obesity;Hypertension;Increase knowledge of respiratory medications and ability to use respiratory devices properly.    Review Ronalee Belts has done well inrehab.  His weight is down 2 lb from when he started.  He continues to refrain from smoking.  Pressures are doing well. He is doing better with breathing, other than his sinuses.  He is consistent with meds and nebulizers.    Expected Outcomes [facility]             Exercise Goals and Review:    Exercise Goals  Re-Evaluation:    Nutrition & Weight - Outcomes:  Pre Biometrics - 05/24/21 1201       Pre Biometrics   Height 5' 11.5" (1.816 m)    Weight 176 lb 14.4 oz (80.2 kg)    BMI (Calculated) 24.33    Single Leg Stand 30 seconds             Post Biometrics - 10/09/21 8676        Post  Biometrics   Height 5' 11.5" (1.816 m)    Weight 174 lb 9.6 oz (79.2 kg)    BMI (Calculated) 24.01    Single Leg Stand 30 seconds             Nutrition:  Nutrition Therapy & Goals - 06/26/21 1018       Nutrition Therapy   Diet Heart healthy, low Na, pulmonary MNT    Protein (specify units) 95g    Fiber 30 grams    Whole Grain Foods 3 servings    Saturated Fats 12 max. grams    Fruits and Vegetables 8 servings/day    Sodium 1.5 grams      Personal Nutrition Goals   Nutrition Goal ST: have 1 good protein source at each meal/snack LT: limit Na <1.5g, meet protein and calorie needs consistently    Comments He has surgeries on his lungs in November - it is experimental and he may be a part of the placebo group. He feels short of breath after cooking. He has small frequent meals (4-6 meals) and has some shortness of breath. B: biscuit with country ham L: pot pie D: shrimp or fish 1x/week, beef stew, pork with rice and potatoes and salads and other green vegetables. He gets a good variety of vegetables. He adds spices and not much salt, bacon grease sometimes, olive oil mostly with cooking. He enjoys cheese and eggs as well, he has peanuts as snack with sundrop. Drinks: 1 gallon of milk every two day, sundrop, sometimes rum and coke. Discussed heart healthy eating and pulmonary MNT.      Intervention Plan   Intervention Prescribe, educate and counsel regarding individualized specific dietary modifications aiming towards targeted core components such as weight, hypertension, lipid management, diabetes, heart failure and other comorbidities.    Expected Outcomes Short Term Goal: Understand basic  principles of dietary content, such as calories, fat, sodium, cholesterol and nutrients.;Short Term Goal: A plan has been developed with personal nutrition goals set during dietitian appointment.;Long Term Goal: Adherence to prescribed nutrition plan.             Nutrition Discharge:   Education Questionnaire Score:  Knowledge Questionnaire Score - 05/24/21 1208       Knowledge Questionnaire Score   Pre Score 17/18:  Oxygen             Goals reviewed with patient; copy given to patient.

## 2021-10-16 ENCOUNTER — Other Ambulatory Visit: Payer: Self-pay

## 2021-10-16 DIAGNOSIS — J449 Chronic obstructive pulmonary disease, unspecified: Secondary | ICD-10-CM

## 2021-10-16 NOTE — Progress Notes (Signed)
Daily Session Note  Patient Details  Name: Isaac Harrison MRN: [id number] Date of Birth: 1955-11-05 Referring Provider:   Flowsheet Row Pulmonary Rehab from 05/24/2021 in [facility] Cardiac and Pulmonary Rehab  Referring Provider [name] [facility])       Encounter Date: 10/16/2021  Check In:  Session Check In - 10/16/21 0912       Check-In   Supervising physician immediately available to respond to emergencies See telemetry face sheet for immediately available ER MD    Location [facility]-Cardiac & Pulmonary Rehab    Staff Present [name], MPA, RN;Jessica Luan Pulling, MA, RCEP, CCRP, CCET;Amanda Sommer, BA, ACSM CEP, Exercise Physiologist    Virtual Visit No    Medication changes reported     No    Fall or balance concerns reported    No    Warm-up and Cool-down Performed on first and last piece of equipment    Resistance Training Performed Yes    VAD Patient? No    PAD/SET Patient? No      Pain Assessment   Currently in Pain? No/denies                Social History   Tobacco Use  Smoking Status Former   Packs/day: 1.50   Years: 35.00   Pack years: 52.50   Types: Cigarettes   Quit date: 03/09/2021   Years since quitting: 0.6  Smokeless Tobacco Never  Tobacco Comments   Quit   not smoking any at all    Goals Met:  Independence with exercise equipment Exercise tolerated well No report of concerns or symptoms today Strength training completed today  Goals Unmet:  Not Applicable  Comments: Pt able to follow exercise prescription today without complaint.  Will continue to monitor for progression.    Dr. Emily Filbert is Medical Director for [place].  Dr. Ottie Glazier is Medical Director for [facility] Pulmonary Rehabilitation.

## 2021-10-18 ENCOUNTER — Other Ambulatory Visit: Payer: Self-pay

## 2021-10-18 DIAGNOSIS — J449 Chronic obstructive pulmonary disease, unspecified: Secondary | ICD-10-CM

## 2021-10-18 NOTE — Progress Notes (Signed)
Daily Session Note  Patient Details  Name: Isaac Harrison MRN: [id number] Date of Birth: 01-Nov-1955 Referring Provider:   Flowsheet Row Pulmonary Rehab from 05/24/2021 in [facility] Cardiac and Pulmonary Rehab  Referring Provider Isaac Harrison [facility])       Encounter Date: 10/18/2021  Check In:  Session Check In - 10/18/21 0853       Check-In   Supervising physician immediately available to respond to emergencies See telemetry face sheet for immediately available ER MD    Location [facility]-Cardiac & Pulmonary Rehab    Staff Present Isaac Harrison, MPA, Isaac Harrison, BS, ACSM CEP, Exercise Physiologist;Isaac Harrison, BA, ACSM CEP, Exercise Physiologist    Virtual Visit No    Medication changes reported     No    Fall or balance concerns reported    No    Warm-up and Cool-down Performed on first and last piece of equipment    Resistance Training Performed Yes    VAD Patient? No    PAD/SET Patient? No      Pain Assessment   Currently in Pain? No/denies                Social History   Tobacco Use  Smoking Status Former   Packs/day: 1.50   Years: 35.00   Pack years: 52.50   Types: Cigarettes   Quit date: 03/09/2021   Years since quitting: 0.6  Smokeless Tobacco Never  Tobacco Comments   Quit   not smoking any at all    Goals Met:  Proper associated with RPD/PD & O2 Sat Independence with exercise equipment Exercise tolerated well No report of concerns or symptoms today Strength training completed today  Goals Unmet:  Not Applicable  Comments:  Isaac Harrison graduated today from  rehab with 36 sessions completed.  Details of the patient's exercise prescription and what He needs to do in order to continue the prescription and progress were discussed with patient.  Patient was given a copy of prescription and goals.  Patient verbalized understanding.  Isaac Harrison plans to continue to exercise by walking at home.     Dr. Emily Filbert is Medical Director for [facility].  Dr. Ottie Glazier is Medical Director for [facility] Pulmonary Rehabilitation.

## 2021-10-18 NOTE — Progress Notes (Signed)
Pulmonary Individual Treatment Plan  Patient Details  Name: Isaac Harrison MRN: [id number] Date of Birth: 12-17-1955 Referring Provider:   Flowsheet Row Pulmonary Rehab from 05/24/2021 in [facility] Cardiac and Pulmonary Rehab  Referring Provider [name] [facility])       Initial Encounter Date:  Flowsheet Row Pulmonary Rehab from 05/24/2021 in [facility] Cardiac and Pulmonary Rehab  Date 05/24/21       Visit Diagnosis: Chronic obstructive pulmonary disease, unspecified COPD type ([place])  Patient's Home Medications on Admission:  Current Outpatient Medications:    albuterol (PROVENTIL HFA;VENTOLIN HFA) 108 (90 Base) MCG/ACT inhaler, Inhale 2 puffs into the lungs every 6 (six) hours as needed for wheezing or shortness of breath., Disp: 1 Inhaler, Rfl: 0   albuterol (PROVENTIL) (2.5 MG/3ML) 0.083% nebulizer solution, USE 1 AMPULE BY ORAL INHALATION FOUR TIMES A DAY AS NEEDED FOR BREATHING / SHORTNESS OF BREATH, Disp: , Rfl:    amLODipine (NORVASC) 10 MG tablet, Take by mouth., Disp: , Rfl:    azithromycin (ZITHROMAX Z-PAK) 250 MG tablet, Take 2 tablets (500 mg) on  Day 1,  followed by 1 tablet (250 mg) once daily on Days 2 through 5. (Patient not taking: Reported on 04/07/2018), Disp: 6 each, Rfl: 0   Budeson-Glycopyrrol-Formoterol (BREZTRI AEROSPHERE) 160-9-4.8 MCG/ACT AERO, Inhale 2 Inhalers into the lungs 2 (two) times daily., Disp: , Rfl:    fluticasone (FLONASE) 50 MCG/ACT nasal spray, INSTILL 2 SPRAYS INTO EACH NOSTRIL ONCE EVERY DAY MAXIMUM 2 SPRAYS IN EACH NOSTRIL DAILY. FOR ALLERGIES, Disp: , Rfl:    guaiFENesin (MUCINEX) 600 MG 12 hr tablet, Take 1 tablet by mouth 2 (two) times daily., Disp: , Rfl:    predniSONE (DELTASONE) 20 MG tablet, Take 3 tablets (60 mg total) by mouth daily., Disp: 12 tablet, Rfl: 0   predniSONE (DELTASONE) 20 MG tablet, Take 3 tablets (60 mg total) by mouth daily. (Patient not taking: Reported on 05/18/2021), Disp: 12 tablet, Rfl: 0   Spacer/Aero Chamber Mouthpiece  MISC, 1 Units by Does not apply route every 4 (four) hours as needed (wheezing). (Patient not taking: Reported on 04/07/2018), Disp: 1 each, Rfl: 0  Past Medical History: Past Medical History:  Diagnosis Date   COPD (chronic obstructive pulmonary disease) ([place])     Tobacco Use: Social History   Tobacco Use  Smoking Status Former   Packs/day: 1.50   Years: 35.00   Pack years: 52.50   Types: Cigarettes   Quit date: 03/09/2021   Years since quitting: 0.6  Smokeless Tobacco Never  Tobacco Comments   Quit   not smoking any at all    Labs: Recent Review Flowsheet Data     Labs for ITP Cardiac and Pulmonary Rehab Latest Ref Rng & Units 04/07/2018   HCO3 20.0 - 28.0 mmol/L 27.1   ACIDBASEDEF 0.0 - 2.0 mmol/L 0.6        Pulmonary Assessment Scores:  Pulmonary Assessment Scores     Row Name 05/24/21 1154 10/11/21 0930       ADL UCSD   ADL Phase Entry Exit    SOB Score total 52 53    Rest 1 0    Walk 3 2    Stairs 4 4    Bath 3 2    Dress 3 2    Shop 3 3      CAT Score   CAT Score 29 20      mMRC Score   mMRC Score 2.5 2  UCSD: Self-administered rating of dyspnea associated with activities of daily living (ADLs) 6-point scale (0 = "not at all" to 5 = "maximal or unable to do because of breathlessness")  Scoring Scores range from 0 to 120.  Minimally important difference is 5 units  CAT: CAT can identify the health impairment of COPD patients and is better correlated with disease progression.  CAT has a scoring range of zero to 40. The CAT score is classified into four groups of low (less than 10), medium (10 - 20), high (21-30) and very high (31-40) based on the impact level of disease on health status. A CAT score over 10 suggests significant symptoms.  A worsening CAT score could be explained by an exacerbation, poor medication adherence, poor inhaler technique, or progression of COPD or comorbid conditions.  CAT MCID is 2 points  mMRC: mMRC  (Modified Medical Research Council) Dyspnea Scale is used to assess the degree of baseline functional disability in patients of respiratory disease due to dyspnea. No minimal important difference is established. A decrease in score of 1 point or greater is considered a positive change.   Pulmonary Function Assessment:   Exercise Target Goals: Exercise Program Goal: Individual exercise prescription set using results from initial 6 min walk test and THRR while considering  patients activity barriers and safety.   Exercise Prescription Goal: Initial exercise prescription builds to 30-45 minutes a day of aerobic activity, 2-3 days per week.  Home exercise guidelines will be given to patient during program as part of exercise prescription that the participant will acknowledge.  Education: Aerobic Exercise: - Group verbal and visual presentation on the components of exercise prescription. Introduces F.I.T.T principle from ACSM for exercise prescriptions.  Reviews F.I.T.T. principles of aerobic exercise including progression. Written material given at graduation. Flowsheet Row Pulmonary Rehab from 10/18/2021 in [facility] Cardiac and Pulmonary Rehab  Date 07/05/21  Educator [facility]  Instruction Review Code 1- Verbalizes Understanding       Education: Resistance Exercise: - Group verbal and visual presentation on the components of exercise prescription. Introduces F.I.T.T principle from ACSM for exercise prescriptions  Reviews F.I.T.T. principles of resistance exercise including progression. Written material given at graduation.    Education: Exercise & Equipment Safety: - Individual verbal instruction and demonstration of equipment use and safety with use of the equipment. Flowsheet Row Pulmonary Rehab from 10/18/2021 in [facility] Cardiac and Pulmonary Rehab  Education need identified 05/24/21  Date 05/24/21  Educator [place]  Instruction Review Code 1- Verbalizes Understanding       Education: Exercise  Physiology & General Exercise Guidelines: - Group verbal and written instruction with models to review the exercise physiology of the cardiovascular system and associated critical values. Provides general exercise guidelines with specific guidelines to those with heart or lung disease.  Flowsheet Row Pulmonary Rehab from 10/18/2021 in [facility] Cardiac and Pulmonary Rehab  Date 06/28/21  Educator [facility]  Instruction Review Code 1- Verbalizes Understanding       Education: Flexibility, Balance, Mind/Body Relaxation: - Group verbal and visual presentation with interactive activity on the components of exercise prescription. Introduces F.I.T.T principle from ACSM for exercise prescriptions. Reviews F.I.T.T. principles of flexibility and balance exercise training including progression. Also discusses the mind body connection.  Reviews various relaxation techniques to help reduce and manage stress (i.e. Deep breathing, progressive muscle relaxation, and visualization). Balance handout provided to take home. Written material given at graduation. Flowsheet Row Pulmonary Rehab from 10/18/2021 in [facility] Cardiac and Pulmonary Rehab  Date 07/19/21  Educator [facility]  Instruction Review Code 1- Verbalizes Understanding       Activity Barriers & Risk Stratification:  Activity Barriers & Cardiac Risk Stratification - 05/24/21 1202       Activity Barriers & Cardiac Risk Stratification   Activity Barriers Shortness of Breath;Deconditioning             6 Minute Walk:  6 Minute Walk     Row Name 05/24/21 1408 10/09/21 0936       6 Minute Walk   Phase Initial Discharge    Distance 975 feet 1355 feet    Distance % Change -- 39 %    Distance Feet Change -- 380 ft    Walk Time 6 minutes 6 minutes    # of Rest Breaks 0 0    MPH 1.84 2.57    METS 3.02 3.74    RPE 11 13    Perceived Dyspnea  2 3    VO2 Peak 10.57 13.1    Symptoms Yes (comment) No    Comments SOB SOB (sinuses clogged)    Resting HR 71 bpm 79  bpm    Resting BP 144/74 112/72    Resting Oxygen Saturation  95 % 95 %    Exercise Oxygen Saturation  during 6 min walk 89 % 90 %    Max Ex. HR 100 bpm 116 bpm    Max Ex. BP 164/76 146/64    2 Minute Post BP 144/74 122/66      Interval HR   1 Minute HR 92 108    2 Minute HR 93 106    3 Minute HR 95 111    4 Minute HR 100 113    5 Minute HR 97 113    6 Minute HR 98 116    2 Minute Post HR 78 105    Interval Heart Rate? Yes Yes      Interval Oxygen   Interval Oxygen? Yes Yes    Baseline Oxygen Saturation % 95 % 95 %    1 Minute Oxygen Saturation % 92 % 95 %    1 Minute Liters of Oxygen 0 L  RA 0 L  Room Air    2 Minute Oxygen Saturation % 93 % 92 %    2 Minute Liters of Oxygen 0 L 0 L    3 Minute Oxygen Saturation % 91 % 91 %    3 Minute Liters of Oxygen 0 L 0 L    4 Minute Oxygen Saturation % 89 % 91 %    4 Minute Liters of Oxygen 0 L 0 L    5 Minute Oxygen Saturation % 90 % 90 %    5 Minute Liters of Oxygen 0 L 0 L    6 Minute Oxygen Saturation % 89 % 90 %    6 Minute Liters of Oxygen 0 L 0 L    2 Minute Post Oxygen Saturation % 96 % 95 %    2 Minute Post Liters of Oxygen 0 L 0 L            Oxygen Initial Assessment:  Oxygen Initial Assessment - 05/24/21 1154       Home Oxygen   Home Oxygen Device None    Sleep Oxygen Prescription None    Home Exercise Oxygen Prescription None    Home Resting Oxygen Prescription None      Initial 6 min Walk   Oxygen Used None  Program Oxygen Prescription   Program Oxygen Prescription None      Intervention   Short Term Goals To learn and demonstrate proper pursed lip breathing techniques or other breathing techniques. ;To learn and understand importance of maintaining oxygen saturations>88%;To learn and demonstrate proper use of respiratory medications;To learn and understand importance of monitoring SPO2 with pulse oximeter and demonstrate accurate use of the pulse oximeter.    Long  Term Goals Verbalizes importance of  monitoring SPO2 with pulse oximeter and return demonstration;Maintenance of O2 saturations>88%;Exhibits proper breathing techniques, such as pursed lip breathing or other method taught during program session;Compliance with respiratory medication;Demonstrates proper use of MDIs             Oxygen Re-Evaluation:  Oxygen Re-Evaluation     Row Name 05/29/21 0925 06/19/21 1023 07/19/21 0944 08/28/21 0955 09/18/21 0938     Program Oxygen Prescription   Program Oxygen Prescription _0      Home Oxygen   Home Oxygen Device _1    Sleep Oxygen Prescription _2    Home Exercise Oxygen Prescription _3    Home Resting Oxygen Prescription _4    Compliance with Home Oxygen Use -- Yes Yes Yes --     Goals/Expected Outcomes   Short Term Goals To learn and demonstrate proper pursed lip breathing techniques or other breathing techniques. ;To learn and understand importance of maintaining oxygen saturations>88%;To learn and understand importance of monitoring SPO2 with pulse oximeter and demonstrate accurate use of the pulse oximeter. To learn and demonstrate proper pursed lip breathing techniques or other breathing techniques. ;To learn and understand importance of maintaining oxygen saturations>88%;To learn and understand importance of monitoring SPO2 with pulse oximeter and demonstrate accurate use of the pulse oximeter. To learn and demonstrate proper pursed lip breathing techniques or other breathing techniques. ;To learn and understand importance of maintaining oxygen saturations>88%;To learn and understand importance of monitoring SPO2 with pulse oximeter and demonstrate accurate use of the pulse oximeter. To learn and demonstrate proper pursed lip breathing techniques or other breathing techniques. ;To learn and understand importance of maintaining oxygen saturations>88%;To learn and understand  importance of monitoring SPO2 with pulse oximeter and demonstrate accurate use of the pulse oximeter. To learn and demonstrate proper pursed lip breathing techniques or other breathing techniques. ;To learn and understand importance of maintaining oxygen saturations>88%;To learn and understand importance of monitoring SPO2 with pulse oximeter and demonstrate accurate use of the pulse oximeter.   Long  Term Goals Verbalizes importance of monitoring SPO2 with pulse oximeter and return demonstration;Maintenance of O2 saturations>88%;Exhibits proper breathing techniques, such as pursed lip breathing or other method taught during program session;Compliance with respiratory medication Verbalizes importance of monitoring SPO2 with pulse oximeter and return demonstration;Maintenance of O2 saturations>88%;Exhibits proper breathing techniques, such as pursed lip breathing or other method taught during program session;Compliance with respiratory medication Verbalizes importance of monitoring SPO2 with pulse oximeter and return demonstration;Maintenance of O2 saturations>88%;Exhibits proper breathing techniques, such as pursed lip breathing or other method taught during program session;Compliance with respiratory medication Verbalizes importance of monitoring SPO2 with pulse oximeter and return demonstration;Maintenance of O2 saturations>88%;Exhibits proper breathing techniques, such as pursed lip breathing or other method taught during program session;Compliance with respiratory medication Verbalizes importance of monitoring SPO2 with pulse oximeter and return demonstration;Maintenance of O2 saturations>88%;Exhibits proper breathing techniques, such as pursed lip breathing or other method taught during program session;Compliance with respiratory medication  Comments Reviewed PLB technique with pt.  Talked about how it works and it's importance in maintaining their exercise saturations. Isaac Harrison uses PLB as needed and takes  medication as directed. Isaac Harrison is doing well with using PLB at home and doesnt have any questions at this time. Isaac Harrison is doing better with his breathing. He is good with PLB and monitoring his sats. Isaac Harrison was doing better with his breathing until his cold last week.  He was using his nebulizer several times a day last week to help.  But now, he is feeling better and breathing is improving.  He does feel that his cold set him back, but he is trying to get back.  He is good about using his PLB.   Goals/Expected Outcomes Short: Become more profiecient at using PLB.   Long: Become independent at using PLB. Short: continue to use PLB Long: be able to do ADLS without shortness of breath Short:  continue to use PLB when needed Long: maintain/improve ability to do ADLs without rest Short:  continue to use PLB when needed Long: maintain/improve ability to do ADLs without rest Short: Continue to recover from cold Long: continue to improve breathing and manage lung disease    Row Name 10/09/21 0952             Program Oxygen Prescription   Program Oxygen Prescription None         Home Oxygen   Home Oxygen Device None       Sleep Oxygen Prescription None       Home Exercise Oxygen Prescription None       Home Resting Oxygen Prescription None         Goals/Expected Outcomes   Short Term Goals To learn and demonstrate proper pursed lip breathing techniques or other breathing techniques. ;To learn and understand importance of maintaining oxygen saturations>88%;To learn and understand importance of monitoring SPO2 with pulse oximeter and demonstrate accurate use of the pulse oximeter.       Long  Term Goals Verbalizes importance of monitoring SPO2 with pulse oximeter and return demonstration;Maintenance of O2 saturations>88%;Exhibits proper breathing techniques, such as pursed lip breathing or other method taught during program session;Compliance with respiratory medication       Comments Isaac Harrison has done well in  rehab. His breathing is better overall, but now struggling with sinus congestion and working with [place] on that.  He continues to do well with PLB.       Goals/Expected Outcomes Continue to manage lung disease                Oxygen Discharge (Final Oxygen Re-Evaluation):  Oxygen Re-Evaluation - 10/09/21 0952       Program Oxygen Prescription   Program Oxygen Prescription None      Home Oxygen   Home Oxygen Device None    Sleep Oxygen Prescription None    Home Exercise Oxygen Prescription None    Home Resting Oxygen Prescription None      Goals/Expected Outcomes   Short Term Goals To learn and demonstrate proper pursed lip breathing techniques or other breathing techniques. ;To learn and understand importance of maintaining oxygen saturations>88%;To learn and understand importance of monitoring SPO2 with pulse oximeter and demonstrate accurate use of the pulse oximeter.    Long  Term Goals Verbalizes importance of monitoring SPO2 with pulse oximeter and return demonstration;Maintenance of O2 saturations>88%;Exhibits proper breathing techniques, such as pursed lip breathing or other method taught during program session;Compliance with respiratory medication  Comments Isaac Harrison has done well in rehab. His breathing is better overall, but now struggling with sinus congestion and working with [place] on that.  He continues to do well with PLB.    Goals/Expected Outcomes Continue to manage lung disease             Initial Exercise Prescription:  Initial Exercise Prescription - 05/24/21 1400       Date of Initial Exercise RX and Referring Provider   Date 05/24/21    Referring Provider [name] ([place])      Recumbant Bike   Level 1    RPM 60    Watts 25    Minutes 15    METs 3      REL-XR   Level 1    Speed 50    Minutes 15    METs 3      T5 Nustep   Level 1    SPM 80    Minutes 15    METs 3      Track   Laps 17    Minutes 15    METs 1.92      Prescription Details    Frequency (times per week) 2    Duration Progress to 30 minutes of continuous aerobic without signs/symptoms of physical distress      Intensity   THRR 40-80% of Max Heartrate 103-136    Ratings of Perceived Exertion 11-13    Perceived Dyspnea 0-4      Progression   Progression Continue to progress workloads to maintain intensity without signs/symptoms of physical distress.      Resistance Training   Training Prescription Yes    Weight 3 lb    Reps 10-15             Perform Capillary Blood Glucose checks as needed.  Exercise Prescription Changes:   Exercise Prescription Changes     Row Name 05/24/21 1400 06/05/21 0700 06/18/21 1300 07/02/21 1000 07/16/21 1300     Response to Exercise   Blood Pressure (Admit) 144/74 122/64 128/64 120/60 126/72   Blood Pressure (Exercise) 164/76 140/70 158/64 134/70 --   Blood Pressure (Exit) 144/74 112/62 122/70 112/62 112/60   Heart Rate (Admit) 71 bpm 93 bpm 89 bpm 84 bpm 69 bpm   Heart Rate (Exercise) 100 bpm 119 bpm 104 bpm 103 bpm 106 bpm   Heart Rate (Exit) 72 bpm 98 bpm 92 bpm 82 bpm 83 bpm   Oxygen Saturation (Admit) 95 % 95 % 92 % 96 % 94 %   Oxygen Saturation (Exercise) 89 % 93 % 93 % 93 % 90 %   Oxygen Saturation (Exit) 96 % 95 % 95 % 94 % 93 %   Rating of Perceived Exertion (Exercise) _0 Perceived Dyspnea (Exercise) _1 Symptoms SOB SOB -- SOB SOB   Comments walk test results second full day of exercise -- -- --   Duration -- Continue with 30 min of aerobic exercise without signs/symptoms of physical distress. Continue with 30 min of aerobic exercise without signs/symptoms of physical distress. Continue with 30 min of aerobic exercise without signs/symptoms of physical distress. Continue with 30 min of aerobic exercise without signs/symptoms of physical distress.   Intensity -- THRR unchanged THRR unchanged THRR unchanged THRR unchanged     Progression   Progression -- Continue to progress workloads  to maintain intensity without signs/symptoms of physical distress. Continue to progress workloads  to maintain intensity without signs/symptoms of physical distress. Continue to progress workloads to maintain intensity without signs/symptoms of physical distress. Continue to progress workloads to maintain intensity without signs/symptoms of physical distress.   Average METs -- 2.56 3.13 3.06 2.45     Resistance Training   Training Prescription -- Yes Yes Yes Yes   Weight -- 3 lb 3 lb 3 lb 3 lb   Reps -- 10-15 10-15 10-15 10-15     Interval Training   Interval Training -- No No No No     Recumbant Bike   Level -- 1 -- -- --   Watts -- 15 -- -- --   Minutes -- 15 -- -- --   METs -- 2.59 -- -- --     REL-XR   Level -- _0 --   Minutes -- _1 --   METs -- 3.6 4 4.4 --     T5 Nustep   Level -- 1 -- 3 3   Minutes -- 15 -- 15 15   METs -- 1.8 -- -- --     Track   Laps -- _2 Minutes -- _3 METs -- 2.25 2.25 2.36 2.36     Oxygen   Maintain Oxygen Saturation -- 88% or higher 88% or higher 88% or higher 88% or higher    Row Name 07/30/21 1300 08/13/21 0800 09/11/21 1300 09/24/21 1400 10/08/21 1100     Response to Exercise   Blood Pressure (Admit) 138/68 126/72 130/70 138/62 138/72   Blood Pressure (Exit) 112/68 122/62 142/70 126/64 122/70   Heart Rate (Admit) 97 bpm 67 bpm -- 103 bpm 105 bpm   Heart Rate (Exercise) 115 bpm 101 bpm 76 bpm 123 bpm 122 bpm   Heart Rate (Exit) 97 bpm 83 bpm 105 bpm 100 bpm 110 bpm   Oxygen Saturation (Admit) 94 % 95 % 83 % 94 % 94 %   Oxygen Saturation (Exercise) 91 % 91 % 95 % 92 % 90 %   Oxygen Saturation (Exit) 94 % 95 % 92 % 96 % 94 %   Rating of Perceived Exertion (Exercise) 12 13 96 11 11   Perceived Dyspnea (Exercise) _4 Symptoms SOB SOB -- -- --   Duration Continue with 30 min of aerobic exercise without signs/symptoms of physical distress. Continue with 30 min of aerobic exercise without signs/symptoms of  physical distress. Continue with 30 min of aerobic exercise without signs/symptoms of physical distress. Continue with 30 min of aerobic exercise without signs/symptoms of physical distress. Continue with 30 min of aerobic exercise without signs/symptoms of physical distress.   Intensity _5      Progression   Progression Continue to progress workloads to maintain intensity without signs/symptoms of physical distress. Continue to progress workloads to maintain intensity without signs/symptoms of physical distress. Continue to progress workloads to maintain intensity without signs/symptoms of physical distress. Continue to progress workloads to maintain intensity without signs/symptoms of physical distress. Continue to progress workloads to maintain intensity without signs/symptoms of physical distress.   Average METs 2.57 2.26 2.6 3.07 2.74     Resistance Training   Training Prescription _6    Weight 4 lb 4 lb 4 lb 4 lb 4 lb   Reps 10-15 10-15 10-15 10-15 10-15     Interval Training   Interval  Training _0      Recumbant Bike   Level -- -- -- 3 --   Watts -- -- -- 23 --   Minutes -- -- -- 15 --   METs -- -- -- 2.98 --     REL-XR   Level 3 -- _1 Minutes 15 -- _2 METs 3.1 -- -- 4.3 --     T5 Nustep   Level 3 3 -- 3 --   Minutes 15 15 -- 15 --   METs 2 1.9 -- 2.1 --     Track   Laps _3 35 32   Minutes _4 METs 2.63 2.63 2.63 2.9 2.74     Home Exercise Plan   Plans to continue exercise at Home (comment)  walking Home (comment)  walking Home (comment)  walking Home (comment)  walking Home (comment)  walking   Frequency Add 2 additional days to program exercise sessions. Add 2 additional days to program exercise sessions. Add 2 additional days to program exercise sessions. Add 2 additional days to program exercise sessions. Add 2 additional days to program  exercise sessions.   Initial Home Exercises Provided 06/19/21 06/19/21 06/19/21 06/19/21 06/19/21     Oxygen   Maintain Oxygen Saturation 88% or higher 88% or higher 88% or higher 88% or higher --            Exercise Comments:   Exercise Comments     Row Name 05/29/21 3646 10/18/21 0855         Exercise Comments First full day of exercise!  Patient was oriented to gym and equipment including functions, settings, policies, and procedures.  Patient's individual exercise prescription and treatment plan were reviewed.  All starting workloads were established based on the results of the 6 minute walk test done at initial orientation visit.  The plan for exercise progression was also introduced and progression will be customized based on patient's performance and goals. Melquan graduated today from  rehab with 36 sessions completed.  Details of the patient's exercise prescription and what He needs to do in order to continue the prescription and progress were discussed with patient.  Patient was given a copy of prescription and goals.  Patient verbalized understanding.  Shaye plans to continue to exercise by walking at home.               Exercise Goals and Review:   Exercise Goals     Row Name 05/24/21 1410             Exercise Goals   Increase Physical Activity Yes       Intervention Provide advice, education, support and counseling about physical activity/exercise needs.;Develop an individualized exercise prescription for aerobic and resistive training based on initial evaluation findings, risk stratification, comorbidities and participant's personal goals.       Expected Outcomes Short Term: Attend rehab on a regular basis to increase amount of physical activity.;Long Term: Add in home exercise to make exercise part of routine and to increase amount of physical activity.;Long Term: Exercising regularly at least 3-5 days a week.       Increase Strength and Stamina Yes        Intervention Provide advice, education, support and counseling about physical activity/exercise needs.;Develop an individualized exercise prescription for aerobic and resistive training based on initial evaluation findings, risk stratification, comorbidities and participant's personal goals.  Expected Outcomes Short Term: Increase workloads from initial exercise prescription for resistance, speed, and METs.;Short Term: Perform resistance training exercises routinely during rehab and add in resistance training at home;Long Term: Improve cardiorespiratory fitness, muscular endurance and strength as measured by increased METs and functional capacity (6MWT)       Able to understand and use rate of perceived exertion (RPE) scale Yes       Intervention Provide education and explanation on how to use RPE scale       Expected Outcomes Short Term: Able to use RPE daily in rehab to express subjective intensity level;Long Term:  Able to use RPE to guide intensity level when exercising independently       Able to understand and use Dyspnea scale Yes       Intervention Provide education and explanation on how to use Dyspnea scale       Expected Outcomes Short Term: Able to use Dyspnea scale daily in rehab to express subjective sense of shortness of breath during exertion;Long Term: Able to use Dyspnea scale to guide intensity level when exercising independently       Knowledge and understanding of Target Heart Rate Range (THRR) Yes       Intervention Provide education and explanation of THRR including how the numbers were predicted and where they are located for reference       Expected Outcomes Long Term: Able to use THRR to govern intensity when exercising independently;Short Term: Able to state/look up THRR;Short Term: Able to use daily as guideline for intensity in rehab       Able to check pulse independently Yes       Intervention Provide education and demonstration on how to check pulse in carotid and  radial arteries.;Review the importance of being able to check your own pulse for safety during independent exercise       Expected Outcomes Short Term: Able to explain why pulse checking is important during independent exercise;Long Term: Able to check pulse independently and accurately       Understanding of Exercise Prescription Yes       Intervention Provide education, explanation, and written materials on patient's individual exercise prescription       Expected Outcomes Short Term: Able to explain program exercise prescription;Long Term: Able to explain home exercise prescription to exercise independently                Exercise Goals Re-Evaluation :  Exercise Goals Re-Evaluation     Row Name 05/29/21 0925 06/05/21 0749 06/18/21 1352 06/19/21 1022 07/02/21 1103     Exercise Goal Re-Evaluation   Exercise Goals Review Increase Physical Activity;Able to understand and use rate of perceived exertion (RPE) scale;Knowledge and understanding of Target Heart Rate Range (THRR);Understanding of Exercise Prescription;Increase Strength and Stamina;Able to understand and use Dyspnea scale;Able to check pulse independently Increase Physical Activity;Increase Strength and Stamina;Understanding of Exercise Prescription Increase Physical Activity;Increase Strength and Stamina Increase Physical Activity;Increase Strength and Stamina;Able to understand and use rate of perceived exertion (RPE) scale;Knowledge and understanding of Target Heart Rate Range (THRR);Able to check pulse independently Increase Physical Activity;Increase Strength and Stamina   Comments Reviewed RPE and dyspnea scales, THR and program prescription with pt today.  Pt voiced understanding and was given a copy of goals to take home. Isaac Harrison is off to a good start in rehab.  He is already at 23 laps on the track on his third visit.  We will continue to monitor his progress. Isaac Harrison is  doing well so far.  Staff will encourag etrying 4 lb for  strength work. Reviewed home exercise with pt today.  Pt plans to walk and consider [place] for exercise.  Reviewed THR, pulse, RPE, sign and symptoms, pulse oximetery and when to call 911 or MD.  Also discussed weather considerations and indoor options.  Pt voiced understanding. Isaac Harrison is continuing to do well in rehab. He has increased to level 3 on the T5 Nustep and completed 25 laps on the track which is the most thus far. He should be encouraged to increase to 4 lbs for his handweights. Will continue to monitor   Expected Outcomes Short: Use RPE daily to regulate intensity. Long: Follow program prescription in THR. Short: Continue to attend rehab regularly Long: Continue to follow program prescription Short: try 4 lb Long: build overall stamina Short: add one day per week in addition to program sessions  Long:exercise 3-5 days per week Short: Continue to increase loads on the track and increase handweights to 4 lbs Long: Continue to increase overall MET level    Row Name 07/16/21 1332 07/19/21 0951 07/30/21 1339 08/13/21 0842 08/27/21 1028     Exercise Goal Re-Evaluation   Exercise Goals Review Increase Physical Activity;Increase Strength and Stamina Increase Physical Activity;Increase Strength and Stamina Increase Physical Activity;Increase Strength and Stamina;Understanding of Exercise Prescription Increase Physical Activity;Increase Strength and Stamina Increase Physical Activity;Increase Strength and Stamina   Comments Isaac Harrison has worked up to 30 laps on the track. Oxygen sats have been in the 90s.  He has reached lower end of THR range  .He still needs to try 4 lb weights. Mike ha smoved up to [employer].  He walked first today and alternates which he does first for variety. Isaac Harrison is doing well in rehab.  He is getting in 30 laps on the track.  We will encourage him to try 5 lb weights.  We will continue to monitor his progress. Isaac Harrison attends consistently and reaches THR range most sessions.  He  still needs to try 5 lb weights.  Staff will monitor progress. Isaac Harrison has not been to rehab for the last couple of weeks due to having surgery and then getting sick. He should be due back soon and maintain consistent attendance.   Expected Outcomes Short: try 4 lb weights Long: improve overall stamina Short: continue to increase workloads as tolerated Long: increase average MET level Short: Try 5 lb weights Long: Continue to improve stamina Short:  move up to 5 lb weights Long: improve overall stamina Short: Resume consistent attendance Long: Continue to increase overall MET level    Row Name 08/28/21 0935 09/11/21 1320 09/18/21 0935 09/24/21 1407 10/08/21 1147     Exercise Goal Re-Evaluation   Exercise Goals Review Increase Physical Activity;Increase Strength and Stamina;Understanding of Exercise Prescription Increase Physical Activity;Increase Strength and Stamina Increase Physical Activity;Increase Strength and Stamina;Understanding of Exercise Prescription Increase Physical Activity;Increase Strength and Stamina;Understanding of Exercise Prescription Increase Physical Activity;Increase Strength and Stamina   Comments Isaac Harrison returned to rehab today after surgery.  He is feeling better and thinks he got the treatment as he is feeling better.  He has been walking some at home, but plans to get back to it more again now that he is feeling better. His stamina has gotten better since starting the program. Isaac Harrison attends consistently and reaches THR range.  He is up to 30 laps on the track. Staff will suggest he try 5 lb for strength work. We will  continue to monitor. Isaac Harrison was out last week with a cold.  Normally he is active at home.  He uses his wife's equipment (ski machine) for cardio.  He also does some leg resistance work too.  He is feeling better this week, but feels that being sick set him back. Isaac Harrison is doing well in rehab.  He was up to 35 laps on the track last week and 23 watts on the bike.  We will  continue to monitor his progress. Isaac Harrison attends consistently   Expected Outcomes Short: Return to consistent exericse Long: Continue to improve stamina Short: try 5 lb Long: continue to improve stamina Short: Get back into routine and recover Long: COnitnue to improve stamina. Short: Improve post 6MWT Long: Continue to exercise independently --    Row Name 10/08/21 1148 10/09/21 0938           Exercise Goal Re-Evaluation   Exercise Goals Review -- Increase Physical Activity;Increase Strength and Stamina;Understanding of Exercise Prescription      Comments and works in [facility] range. He is due for post walk and we expect to see improvement. Isaac Harrison improved his post walk by 380 ft!!  He is feeling better and hopes to continue to exercise after graduation.  He is planning to walk on the hill outside of his house and use some of his wife's equipment.  His stamina has greatly improved while in program.      Expected Outcomes Short: improve post 6MWT Long: maintain exercise on his own Continue to exercise independently               Discharge Exercise Prescription (Final Exercise Prescription Changes):  Exercise Prescription Changes - 10/08/21 1100       Response to Exercise   Blood Pressure (Admit) 138/72    Blood Pressure (Exit) 122/70    Heart Rate (Admit) 105 bpm    Heart Rate (Exercise) 122 bpm    Heart Rate (Exit) 110 bpm    Oxygen Saturation (Admit) 94 %    Oxygen Saturation (Exercise) 90 %    Oxygen Saturation (Exit) 94 %    Rating of Perceived Exertion (Exercise) 11    Perceived Dyspnea (Exercise) 2    Duration Continue with 30 min of aerobic exercise without signs/symptoms of physical distress.    Intensity THRR unchanged      Progression   Progression Continue to progress workloads to maintain intensity without signs/symptoms of physical distress.    Average METs 2.74      Resistance Training   Training Prescription Yes    Weight 4 lb    Reps 10-15      Interval Training    Interval Training No      REL-XR   Level 3    Minutes 15      Track   Laps 32    Minutes 15    METs 2.74      Home Exercise Plan   Plans to continue exercise at Home (comment)   walking   Frequency Add 2 additional days to program exercise sessions.    Initial Home Exercises Provided 06/19/21             Nutrition:  Target Goals: Understanding of nutrition guidelines, daily intake of sodium <1556m, cholesterol <2053m calories 30% from fat and 7% or less from saturated fats, daily to have 5 or more servings of fruits and vegetables.  Education: All About Nutrition: -Group instruction provided by verbal, written material, interactive activities,  discussions, models, and posters to present general guidelines for heart healthy nutrition including fat, fiber, MyPlate, the role of sodium in heart healthy nutrition, utilization of the nutrition label, and utilization of this knowledge for meal planning. Follow up email sent as well. Written material given at graduation. Flowsheet Row Pulmonary Rehab from 10/18/2021 in [facility] Cardiac [facility] Pul[facility] Rehab  Date 05/31/21  Educator [facility]  Instruction Review Code 1- Verbalizes Understanding       Biometrics:  Pre Biometrics - 05/24/21 1201       Pre Biometrics   Height 5' 11.5" (1.816 m)    Weight 176 lb 14.4 oz (80.2 kg)    BMI (Calculated) 24.33    Single Leg Stand 30 seconds             Post Biometrics - 10/09/21 4944        Post  Biometrics   Height 5' 11.5" (1.816 m)    Weight 174 lb 9.6 oz (79.2 kg)    BMI (Calculated) 24.01    Single Leg Stand 30 seconds             Nutrition Therapy Plan and Nutrition Goals:  Nutrition Therapy & Goals - 06/26/21 1018       Nutrition Therapy   Diet Heart healthy, low Na, pulmonary MNT    Protein (specify units) 95g    Fiber 30 grams    Whole Grain Foods 3 servings    Saturated Fats 12 max. grams    Fruits and Vegetables 8 servings/day    Sodium 1.5 grams       Personal Nutrition Goals   Nutrition Goal ST: have 1 good protein source at each meal/snack LT: limit Na <1.5g, meet protein and calorie needs consistently    Comments He has surgeries on his lungs in November - it is experimental and he may be a part of the placebo group. He feels short of breath after cooking. He has small frequent meals (4-6 meals) and has some shortness of breath. B: biscuit with country ham L: pot pie D: shrimp or fish 1x/week, beef stew, pork with rice and potatoes and salads and other green vegetables. He gets a good variety of vegetables. He adds spices and not much salt, bacon grease sometimes, olive oil mostly with cooking. He enjoys cheese and eggs as well, he has peanuts as snack with sundrop. Drinks: 1 gallon of milk every two day, sundrop, sometimes rum and coke. Discussed heart healthy eating and pulmonary MNT.      Intervention Plan   Intervention Prescribe, educate and counsel regarding individualized specific dietary modifications aiming towards targeted core components such as weight, hypertension, lipid management, diabetes, heart failure and other comorbidities.    Expected Outcomes Short Term Goal: Understand basic principles of dietary content, such as calories, fat, sodium, cholesterol and nutrients.;Short Term Goal: A plan has been developed with personal nutrition goals set during dietitian appointment.;Long Term Goal: Adherence to prescribed nutrition plan.             Nutrition Assessments:  MEDIFICTS Score Key: ?70 Need to make dietary changes  40-70 Heart Healthy Diet ? 40 Therapeutic Level Cholesterol Diet  Flowsheet Row Pulmonary Rehab from 10/18/2021 in [facility] Cardiac and Pulmonary Rehab  Picture Your Plate Total Score on Admission 54  Picture Your Plate Total Score on Discharge 58      Picture Your Plate Scores: <96 Unhealthy dietary pattern with much room for improvement. 41-50 Dietary pattern unlikely to meet recommendations  for good  health and room for improvement. 51-60 More healthful dietary pattern, with some room for improvement.  >60 Healthy dietary pattern, although there may be some specific behaviors that could be improved.   Nutrition Goals Re-Evaluation:  Nutrition Goals Re-Evaluation     [place] Name 07/19/21 [phone] 08/28/21 0939 09/18/21 0938 10/09/21 0949       Goals   Nutrition Goal -- ST: have 1 good protein source at each meal/snack LT: limit Na <1.5g, meet protein and calorie needs consistently ST: have 1 good protein source at each meal/snack LT: limit Na <1.5g, meet protein and calorie needs consistently Short: Continue to aim for more protein Long: Continue to limit sodium.    Comment Isaac Harrison has cut back on red meat and eats more fish.  He does eat green vegetables.  He is using frozen vegetables more than canned to reduce sodium. Isaac Harrison is doing well with his diet.  His wife has gotten back to cooking again.  They are getting in lots of fruits and vegetables and cutting back on sodium.  They are eating more fish than ever before.  He is doing well with the holiday goodies as he does not have a big sweet tooth. Isaac Harrison is still doing well with his diet.  He is eating like he supposed to.  He is doing better with his protein especially fish.  He is also trying to limit his sodium.  He likes whitefish and catfish. Isaac Harrison is doing well in rehab.  He is feeling good with his diet and aiming for more protein.  He continues to enjoy the fish he has been eating.  He plans to stick with the diet going forward.    Expected Outcome ST/LT:  continue heart healthy diet Short: Continue to watch sodium Long: Continue to get in a variety of food Short: Continue to aim for more protein Long: Continue to limit sodium. Continue to focus on balanced healthy eating             Nutrition Goals Discharge (Final Nutrition Goals Re-Evaluation):  Nutrition Goals Re-Evaluation - 10/09/21 0949       Goals   Nutrition Goal Short: Continue to  aim for more protein Long: Continue to limit sodium.    Comment Isaac Harrison is doing well in rehab.  He is feeling good with his diet and aiming for more protein.  He continues to enjoy the fish he has been eating.  He plans to stick with the diet going forward.    Expected Outcome Continue to focus on balanced healthy eating             Psychosocial: Target Goals: Acknowledge presence or absence of significant depression and/or stress, maximize coping skills, provide positive support system. Participant is able to verbalize types and ability to use techniques and skills needed for reducing stress and depression.   Education: Stress, Anxiety, and Depression - Group verbal and visual presentation to define topics covered.  Reviews how body is impacted by stress, anxiety, and depression.  Also discusses healthy ways to reduce stress and to treat/manage anxiety and depression.  Written material given at graduation. Flowsheet Row Pulmonary Rehab from 10/18/2021 in [facility] Cardiac and Pulmonary Rehab  Date 06/21/21  Educator [facility]  Instruction Review Code 1- [employer] Understanding       Education: Sleep Hygiene -Provides group verbal and written instruction about how sleep can affect your health.  Define sleep hygiene, discuss sleep cycles and impact of sleep habits. Review good  sleep hygiene tips.    Initial Review & Psychosocial Screening:  Initial Psych Review & Screening - 05/18/21 1025       Initial Review   Current issues with None Identified      Family Dynamics   Good Support System? Yes   wife, few friends     Barriers   Psychosocial barriers to participate in program There are no identifiable barriers or psychosocial needs.;The patient should benefit from training in stress management and relaxation.      Screening Interventions   Interventions Encouraged to exercise;Provide feedback about the scores to participant;To provide support and resources with identified psychosocial needs     Expected Outcomes Short Term goal: Utilizing psychosocial counselor, staff and physician to assist with identification of specific Stressors or current issues interfering with healing process. Setting desired goal for each stressor or current issue identified.;Long Term Goal: Stressors or current issues are controlled or eliminated.;Short Term goal: Identification and review with participant of any Quality of Life or Depression concerns found by scoring the questionnaire.;Long Term goal: The participant improves quality of Life and PHQ9 Scores as seen by post scores and/or verbalization of changes             Quality of Life Scores:  Scores of 19 and below usually indicate a poorer quality of life in these areas.  A difference of  2-3 points is a clinically meaningful difference.  A difference of 2-3 points in the total score of the Quality of Life Index has been associated with significant improvement in overall quality of life, self-image, physical symptoms, and general health in studies assessing change in quality of life.  PHQ-9: Recent Review Flowsheet Data     Depression screen [facility] 2/9 10/18/2021 06/21/2021 05/24/2021   Decreased Interest 0 1 1   Down, Depressed, Hopeless 0 0 0   PHQ - 2 Score 0 1 1   Altered sleeping 0 0 1   Tired, decreased energy 0 1 2   Change in appetite 0 0 0   Feeling bad or failure about yourself  0 0 0   Trouble concentrating 0 0 0   Moving slowly or fidgety/restless 0 1 1   Suicidal thoughts 0 0 0   PHQ-9 Score 0 3 5   Difficult doing work/chores Somewhat difficult Somewhat difficult  Not difficult at all      Interpretation of Total Score  Total Score Depression Severity:  1-4 = Minimal depression, 5-9 = Mild depression, 10-14 = Moderate depression, 15-19 = Moderately severe depression, 20-27 = Severe depression   Psychosocial Evaluation and Intervention:  Psychosocial Evaluation - 06/19/21 0941       Psychosocial Evaluation & Interventions    Comments Isaac Harrison states he has no symptoms of anxiety or depression.  He remains tobacco free in anticipation of the procedure at [facility].  He was able to walk all the way in today which was one of his goals when he started the program.    Expected Outcomes Short: continue to exercise to help manage stress Long: maintain positive outlook             Psychosocial Re-Evaluation:  Psychosocial Re-Evaluation     Row Name 07/19/21 [phone] 08/28/21 7741 09/18/21 0936 10/09/21 0939       Psychosocial Re-Evaluation   Current issues with -- Current Stress Concerns Current Stress Concerns Current Stress Concerns    Comments Patient states that his mood has been good and has not been feeling depressed.  He states no changes in mental status Isaac Harrison is doing well in rehab.  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  He is thinking he got the treatment as he is starting to feel better.  However, he did have a little scare after this last surgery as he started to cough up blood.  That is now doing better and he feels good again.  He deneis any other major stressors at this time.  He is still sleeping well.  He is eager to get back to his exercise routine again too. Last week Isaac Harrison was out with a cold that really set him back and knocked out his breathing.  Overall, though, he is doing well mentally.  He has been keeping a diary with his procedures and he has noticed that he has stayed pretty positive thoughout.  He tries not to let anything really get to him.  He only gets frustrated when he can't breathe. Isaac Harrison has enjoyed rehab and is feeling better overall.  He has really enjoyed the staff and getting to know [place].  He found the education sessions very helpful.  He said he gained a lot from being motivated to come and plans to continue to exercise on his own. His biggest frustration is his sinues and he is working with [facility] to get allergry testing to find out why so congested all the time.     Expected Outcomes Short: Attend [facility] stress management education to decrease stress. Long: Maintain exercise Post [facility] to keep stress at a minimum. Short: Continue to exercise for mental boost Long: Conitnue to stay positive Short: Get back to routine again. Long: COntinue to focus on positive Continue to focus on positive and keep up with exercise    Interventions -- Encouraged to attend Pulmonary Rehabilitation for the exercise Encouraged to attend Pulmonary Rehabilitation for the exercise Encouraged to attend Pulmonary Rehabilitation for the exercise    Continue Psychosocial Services  -- Follow up required by staff -- Follow up required by staff             Psychosocial Discharge (Final Psychosocial Re-Evaluation):  Psychosocial Re-Evaluation - 10/09/21 0939       Psychosocial Re-Evaluation   Current issues with Current Stress Concerns    Comments Isaac Harrison has enjoyed rehab and is feeling better overall.  He has really enjoyed the staff and getting to know [place].  He found the education sessions very helpful.  He said he gained a lot from being motivated to come and plans to continue to exercise on his own. His biggest frustration is his sinues and he is working with [place] to get allergry testing to find out why so congested all the time.    Expected Outcomes Continue to focus on positive and keep up with exercise    Interventions Encouraged to attend Pulmonary Rehabilitation for the exercise    Continue Psychosocial Services  Follow up required by staff             Education: Education Goals: Education classes will be provided on a weekly basis, covering required topics. Participant will state understanding/return demonstration of topics presented.  Learning Barriers/Preferences:  Learning Barriers/Preferences - 05/18/21 1027       Learning Barriers/Preferences   Learning Barriers None    Learning Preferences None             General Pulmonary Education  Topics:  Infection Prevention: - Provides verbal and written material to individual with  discussion of infection control including proper hand washing and proper equipment cleaning during exercise session. Flowsheet Row Pulmonary Rehab from 10/18/2021 in [facility] and Pulmonary Rehab  Education need identified 05/24/21  Date 05/24/21  Educator [place]  Instruction Review Code 1- Verbalizes Understanding       Falls Prevention: - Provides verbal and written material to individual with discussion of falls prevention and safety. Flowsheet Row Pulmonary Rehab from 10/18/2021 in [facility] Cardiac and Pulmonary Rehab  Education need identified 05/24/21  Date 05/24/21  Educator [place]  Instruction Review Code 1- Verbalizes Understanding       Chronic Lung Disease Review: - Group verbal instruction with posters, models, PowerPoint presentations and videos,  to review new updates, new respiratory medications, new advancements in procedures and treatments. Providing information on websites and "800" numbers for continued self-education. Includes information about supplement oxygen, available portable oxygen systems, continuous and intermittent flow rates, oxygen safety, concentrators, and Medicare reimbursement for oxygen. Explanation of Pulmonary Drugs, including class, frequency, complications, importance of spacers, rinsing mouth after steroid MDI's, and proper cleaning methods for nebulizers. Review of basic lung anatomy and physiology related to function, structure, and complications of lung disease. Review of risk factors. Discussion about methods for diagnosing sleep apnea and types of masks and machines for OSA. Includes a review of the use of types of environmental controls: home humidity, furnaces, filters, dust mite/pet prevention, HEPA vacuums. Discussion about weather changes, air quality and the benefits of nasal washing. Instruction on Warning signs, infection symptoms, calling MD promptly, preventive  modes, and value of vaccinations. Review of effective airway clearance, coughing and/or vibration techniques. Emphasizing that all should Create an Action Plan. Written material given at graduation. Flowsheet Row Pulmonary Rehab from 10/18/2021 in [facility]  Education need identified 05/24/21  Date 10/18/21  Educator [facility]  Instruction Review Code 1- Verbalizes Understanding       AED/CPR: - Group verbal and written instruction with the use of models to demonstrate the basic use of the AED with the basic ABC's of resuscitation.    Anatomy and Cardiac Procedures: - Group verbal and visual presentation and models provide information about basic cardiac anatomy and function. Reviews the testing methods done to diagnose heart disease and the outcomes of the test results. Describes the treatment choices: Medical Management, Angioplasty, or Coronary Bypass Surgery for treating various heart conditions including Myocardial Infarction, Angina, Valve Disease, and Cardiac Arrhythmias.  Written material given at graduation.   Medication Safety: - Group verbal and visual instruction to review commonly prescribed medications for heart and lung disease. Reviews the medication, class of the drug, and side effects. Includes the steps to properly store meds and maintain the prescription regimen.  Written material given at graduation. Flowsheet Row Pulmonary Rehab from 10/18/2021 in [facility] Card[facility] and Pulmonary Rehab  Date 09/27/21  Educator [facility]  Instruction Review Code 1- Verbalizes Understanding       Other: -Provides group and verbal instruction on various topics (see comments)   Knowledge Questionnaire Score:  Knowledge Questionnaire Score - 10/18/21 0902       Knowledge Questionnaire Score   Pre Score 17/18: Oxygen    Post Score 18/18              Core Components/Risk Factors/Patient Goals at Admission:  Personal Goals and Risk Factors at Admission - 05/24/21 1418        Core Components/Risk Factors/Patient Goals on Admission    Weight Management Yes;Weight Loss   Patient  goal; BMI < 25   Intervention Weight Management: Develop a combined nutrition and exercise program designed to reach desired caloric intake, while maintaining appropriate intake of nutrient and fiber, sodium and fats, and appropriate energy expenditure required for the weight goal.;Weight Management: Provide education and appropriate resources to help participant work on and attain dietary goals.;Weight Management/Obesity: Establish reasonable short term and long term weight goals.    Admit Weight 176 lb (79.8 kg)    Goal Weight: Short Term 171 lb (77.6 kg)    Goal Weight: Long Term 165 lb (74.8 kg)    Expected Outcomes Short Term: Continue to assess and modify interventions until short term weight is achieved;Long Term: Adherence to nutrition and physical activity/exercise program aimed toward attainment of established weight goal;Weight Loss: Understanding of general recommendations for a balanced deficit meal plan, which promotes 1-2 lb weight loss per week and includes a negative energy balance of [phone] kcal/d;Understanding recommendations for meals to include 15-35% energy as protein, 25-35% energy from fat, 35-60% energy from carbohydrates, less than 276m of dietary cholesterol, 20-35 gm of total fiber daily;Understanding of distribution of calorie intake throughout the day with the consumption of 4-5 meals/snacks    Tobacco Cessation Yes    Number of packs per day MBrandinhas recently quit tobacco use within the last 6 months. Intervention for relapse prevention was provided at the initial medical review. He was encouraged to continue to with tobacco cessation and was provided information on relapse prevention. Patient received information about combination therapy, tobacco cessation classes, quit line, and quit smoking apps in case of a relapse. Patient demonstrated understanding of this  material.Staff will continue to provide encouragement and follow up with the patient throughout the program.    Intervention Assist the participant in steps to quit. Provide individualized education and counseling about committing to Tobacco Cessation, relapse prevention, and pharmacological support that can be provided by physician.;OAdvice worker assist with locating and accessing local/national Quit Smoking programs, and support quit date choice.    Expected Outcomes Short Term: Will demonstrate readiness to quit, by selecting a quit date.;Short Term: Will quit all tobacco product use, adhering to prevention of relapse plan.;Long Term: Complete abstinence from all tobacco products for at least 12 months from quit date.    Improve shortness of breath with ADL's Yes    Intervention Provide education, individualized exercise plan and daily activity instruction to help decrease symptoms of SOB with activities of daily living.    Expected Outcomes Short Term: Improve cardiorespiratory fitness to achieve a reduction of symptoms when performing ADLs;Long Term: Be able to perform more ADLs without symptoms or delay the onset of symptoms    Increase knowledge of respiratory medications and ability to use respiratory devices properly  Yes    Intervention Provide education and demonstration as needed of appropriate use of medications, inhalers, and oxygen therapy.    Expected Outcomes Short Term: Achieves understanding of medications use. Understands that oxygen is a medication prescribed by physician. Demonstrates appropriate use of inhaler and oxygen therapy.;Long Term: Maintain appropriate use of medications, inhalers, and oxygen therapy.    Intervention Provide education on lifestyle modifcations including regular physical activity/exercise, weight management, moderate sodium restriction and increased consumption of fresh fruit, vegetables, and low fat dairy, alcohol moderation, and smoking  cessation.;Monitor prescription use compliance.    Expected Outcomes Short Term: Continued assessment and intervention until BP is < 140/975mHG in hypertensive participants. < 130/804mG in hypertensive participants with diabetes, heart failure  or chronic kidney disease.;Long Term: Maintenance of blood pressure at goal levels.             Education:Diabetes - Individual verbal and written instruction to review signs/symptoms of diabetes, desired ranges of glucose level fasting, after meals and with exercise. Acknowledge that pre and post exercise glucose checks will be done for 3 sessions at entry of program.   Know Your Numbers and Heart Failure: - Group verbal and visual instruction to discuss disease risk factors for cardiac and pulmonary disease and treatment options.  Reviews associated critical values for Overweight/Obesity, Hypertension, Cholesterol, and Diabetes.  Discusses basics of heart failure: signs/symptoms and treatments.  Introduces Heart Failure Zone chart for action plan for heart failure.  Written material given at graduation. Flowsheet Row Pulmonary Rehab from 10/18/2021 in [facility] Cardiac and Pulmonary Rehab  Date 10/11/21  Educator [place]  Instruction Review Code 1- Verbalizes Understanding       Core Components/Risk Factors/Patient Goals Review:   Goals and Risk Factor Review     Row Name 06/19/21 [phone] 07/19/21 0936 08/28/21 0941 09/18/21 0951 10/09/21 0950     Core Components/Risk Factors/Patient Goals Review   Personal Goals Review Tobacco Cessation;Develop more efficient breathing techniques such as purse lipped breathing and diaphragmatic breathing and practicing self-pacing with activity.;Improve shortness of breath with ADL's;Hypertension Tobacco Cessation;Improve shortness of breath with ADL's;Develop more efficient breathing techniques such as purse lipped breathing and diaphragmatic breathing and practicing self-pacing with activity. Tobacco Cessation;Improve  shortness of breath with ADL's;Develop more efficient breathing techniques such as purse lipped breathing and diaphragmatic breathing and practicing self-pacing with activity.;Weight Management/Obesity Tobacco Cessation;Improve shortness of breath with ADL's;Weight Management/Obesity;Hypertension;Increase knowledge of respiratory medications and ability to use respiratory devices properly. Tobacco Cessation;Improve shortness of breath with ADL's;Weight Management/Obesity;Hypertension;Increase knowledge of respiratory medications and ability to use respiratory devices properly.   Review Isaac Harrison is still not smoking in anticipation of a surgery to help his lungs/breathing.  It is an experimental procedure.  He is taking meds as directed.  He uses PLB when needed. Isaac Harrison remains tobacco free.  He has had half of the experimental procedure and has the other half scheduled for 12/7.  He says hi sbreathing seems to be a little better.  He can do more wihtout having to rest.  He feels this is from exercise.  He does monitor oxygen and BP at home.  BP averages 125-135/70-80.  Oxygen stays above 88%.  He uses PLB and feels comfortable with it. Isaac Harrison is doing well.  Still tobacco free!  His weight is steady and blood pressures continue to do well.  He is starting to feel like he is breathing better and doing well on his meds. Isaac Harrison is doing well in rehab.  This past week he lost some more weight. He is still tobacco free. His pressures are still doing well.  While he was sick, he really struggled with breathing.  Now, he is starting to feel better.  He is did use his nebulizer to help with breathing. Isaac Harrison has done well inrehab.  His weight is down 2 lb from when he started.  He continues to refrain from smoking.  Pressures are doing well. He is doing better with breathing, other than his sinuses.  He is consistent with meds and nebulizers.   Expected Outcomes Short:  maintain tobacco free Long: become proficient at PLB Short:  maintain tobacco free status Long: continue to monitor BP and oxygen at home Short: Continue to work on breathing Long:  Conitnue to manage breathing better Short: Continue to improve breathing again.  Long: Continue to manage risk factors. [facility].            Core Components/Risk Factors/Patient Goals at Discharge (Final Review):   Goals and Risk Factor Review - 10/09/21 0950       Core Components/Risk Factors/Patient Goals Review   Personal Goals Review Tobacco Cessation;Improve shortness of breath with ADL's;Weight Management/Obesity;Hypertension;Increase knowledge of respiratory medications and ability to use respiratory devices properly.    Review Isaac Harrison has done well inrehab.  His weight is down 2 lb from when he started.  He continues to refrain from smoking.  Pressures are doing well. He is doing better with breathing, other than his sinuses.  He is consistent with meds and nebulizers.    Expected Outcomes [facility].             ITP Comments:  ITP Comments     Row Name 05/18/21 1049 05/24/21 1407 05/29/21 0925 06/13/21 1411 06/26/21 1109   ITP Comments Virtual orientation call completed today. he has an appointment on Date: 05/24/2021  for EP eval and gym Orientation.  Documentation of diagnosis can be found in [facility] Date: 08/02/2022[name] has recently quit tobacco use within the last 6 months. Intervention for relapse prevention was provided at the initial medical review. He was encouraged to continue to with tobacco cessation and was provided information on relapse prevention. Patient received information about combination therapy, tobacco cessation classes, quit line, and quit smoking apps in case of a relapse. Patient demonstrated understanding of this material.Staff will continue to provide encouragement and follow up with the patient throughout the program. Completed 6MWT and gym orientation. Initial ITP created and sent  for review to Dr. Ottie Glazier, Medical Director. First full day of exercise!  Patient was oriented to gym and equipment including functions, settings, policies, and procedures.  Patient's individual exercise prescription and treatment plan were reviewed.  All starting workloads were established based on the results of the 6 minute walk test done at initial orientation visit.  The plan for exercise progression was also introduced and progression will be customized based on patient's performance and goals. 30 day review completed. ITP sent to Dr. Zetta Bills, Medical Director of  Pulmonary Rehab. Continue with ITP unless changes are made by physician. Completed initial RD consultation    [place] Name 07/11/21 0804 08/08/21 0746 08/27/21 1035 09/05/21 0837 10/03/21 0919   ITP Comments 30 Day review completed. Medical Director ITP review done, changes made as directed, and signed approval by Medical Director. 30 Day review completed. Medical Director ITP review done, changes made as directed, and signed approval by Medical Director. Isaac Harrison has been out due to having surgery and then getting sick. He should be due to return back to rehab soon. 30 Day review completed. Medical Director ITP review done, changes made as directed, and signed approval by Medical Director. 30 Day review completed. Medical Director ITP review done, changes made as directed, and signed approval by Medical Director.    [place] Name 10/18/21 0855           ITP Comments Jahzion graduated today from  rehab with 36 sessions completed.  Details of the patient's exercise prescription and what He needs to do in order to continue the prescription and progress were discussed with patient.  Patient was given a copy of prescription and goals.  Patient verbalized understanding.  Legrand Como  plans to continue to exercise by walking at home.                Comments: discharge ITP

## 2021-10-18 NOTE — Progress Notes (Signed)
Discharge Progress Report  Patient Details  Name: Isaac Harrison MRN: [id number] Date of Birth: [birth year] Referring Provider:   Flowsheet Row Pulmonary Rehab from 05/24/2021 in [facility] Cardiac and Pulmonary Rehab  Referring Provider [name] [facility])        Number of Visits: 36  Reason for Discharge:  Patient reached a stable level of exercise. Patient independent in their exercise. Patient has met program and personal goals.  Smoking History:  Social History   Tobacco Use  Smoking Status Former   Packs/day: 1.50   Years: 35.00   Pack years: 52.50   Types: Cigarettes   Quit date: 03/09/2021   Years since quitting: 0.6  Smokeless Tobacco Never  Tobacco Comments   Quit   not smoking any at all    Diagnosis:  Chronic obstructive pulmonary disease, unspecified COPD type ([place])  ADL UCSD:  Pulmonary Assessment Scores     Row Name 05/24/21 1154 10/11/21 0930       ADL UCSD   ADL Phase Entry Exit    SOB Score total 52 53    Rest 1 0    Walk 3 2    Stairs 4 4    Bath 3 2    Dress 3 2    Shop 3 3      CAT Score   CAT Score 29 20      mMRC Score   mMRC Score 2.5 2             Initial Exercise Prescription:  Initial Exercise Prescription - 05/24/21 1400       Date of Initial Exercise RX and Referring Provider   Date 05/24/21    Referring Provider [name] ([facility])      Recumbant Bike   Level 1    RPM 60    Watts 25    Minutes 15    METs 3      REL-XR   Level 1    Speed 50    Minutes 15    METs 3      T5 Nustep   Level 1    SPM 80    Minutes 15    METs 3      Track   Laps 17    Minutes 15    METs 1.92      Prescription Details   Frequency (times per week) 2    Duration Progress to 30 minutes of continuous aerobic without signs/symptoms of physical distress      Intensity   THRR 40-80% of Max Heartrate 103-136    Ratings of Perceived Exertion 11-13    Perceived Dyspnea 0-4      Progression   Progression Continue to  progress workloads to maintain intensity without signs/symptoms of physical distress.      Resistance Training   Training Prescription Yes    Weight 3 lb    Reps 10-15             Discharge Exercise Prescription (Final Exercise Prescription Changes):  Exercise Prescription Changes - 10/08/21 1100       Response to Exercise   Blood Pressure (Admit) 138/72    Blood Pressure (Exit) 122/70    Heart Rate (Admit) 105 bpm    Heart Rate (Exercise) 122 bpm    Heart Rate (Exit) 110 bpm    Oxygen Saturation (Admit) 94 %    Oxygen Saturation (Exercise) 90 %    Oxygen Saturation (Exit) 94 %    Rating  of Perceived Exertion (Exercise) 11    Perceived Dyspnea (Exercise) 2    Duration Continue with 30 min of aerobic exercise without signs/symptoms of physical distress.    Intensity THRR unchanged      Progression   Progression Continue to progress workloads to maintain intensity without signs/symptoms of physical distress.    Average METs 2.74      Resistance Training   Training Prescription Yes    Weight 4 lb    Reps 10-15      Interval Training   Interval Training No      REL-XR   Level 3    Minutes 15      Track   Laps 32    Minutes 15    METs 2.74      Home Exercise Plan   Plans to continue exercise at Home (comment)   walking   Frequency Add 2 additional days to program exercise sessions.    Initial Home Exercises Provided 06/19/21             Functional Capacity:  6 Minute Walk     Row Name 05/24/21 1408 10/09/21 0936       6 Minute Walk   Phase Initial Discharge    Distance 975 feet 1355 feet    Distance % Change -- 39 %    Distance Feet Change -- 380 ft    Walk Time 6 minutes 6 minutes    # of Rest Breaks 0 0    MPH 1.84 2.57    METS 3.02 3.74    RPE 11 13    Perceived Dyspnea  2 3    VO2 Peak 10.57 13.1    Symptoms Yes (comment) No    Comments SOB SOB (sinuses clogged)    Resting HR 71 bpm 79 bpm    Resting BP 144/74 112/72    Resting  Oxygen Saturation  95 % 95 %    Exercise Oxygen Saturation  during 6 min walk 89 % 90 %    Max Ex. HR 100 bpm 116 bpm    Max Ex. BP 164/76 146/64    2 Minute Post BP 144/74 122/66      Interval HR   1 Minute HR 92 108    2 Minute HR 93 106    3 Minute HR 95 111    4 Minute HR 100 113    5 Minute HR 97 113    6 Minute HR 98 116    2 Minute Post HR 78 105    Interval Heart Rate? Yes Yes      Interval Oxygen   Interval Oxygen? Yes Yes    Baseline Oxygen Saturation % 95 % 95 %    1 Minute Oxygen Saturation % 92 % 95 %    1 Minute Liters of Oxygen 0 L  RA 0 L  Room Air    2 Minute Oxygen Saturation % 93 % 92 %    2 Minute Liters of Oxygen 0 L 0 L    3 Minute Oxygen Saturation % 91 % 91 %    3 Minute Liters of Oxygen 0 L 0 L    4 Minute Oxygen Saturation % 89 % 91 %    4 Minute Liters of Oxygen 0 L 0 L    5 Minute Oxygen Saturation % 90 % 90 %    5 Minute Liters of Oxygen 0 L 0 L    6 Minute Oxygen  Saturation % 89 % 90 %    6 Minute Liters of Oxygen 0 L 0 L    2 Minute Post Oxygen Saturation % 96 % 95 %    2 Minute Post Liters of Oxygen 0 L 0 L             Psychological, QOL, Others - Outcomes: PHQ 2/9: Depression screen [facility] 2/9 10/18/2021 06/21/2021 05/24/2021  Decreased Interest 0 1 1  Down, Depressed, Hopeless 0 0 0  PHQ - 2 Score 0 1 1  Altered sleeping 0 0 1  Tired, decreased energy 0 1 2  Change in appetite 0 0 0  Feeling bad or failure about yourself  0 0 0  Trouble concentrating 0 0 0  Moving slowly or fidgety/restless 0 1 1  Suicidal thoughts 0 0 0  PHQ-9 Score 0 3 5  Difficult doing work/chores Somewhat difficult Somewhat difficult Not difficult at all    Quality of Life:    Nutrition & Weight - Outcomes:  Pre Biometrics - 05/24/21 1201       Pre Biometrics   Height 5' 11.5" (1.816 m)    Weight 176 lb 14.4 oz (80.2 kg)    BMI (Calculated) 24.33    Single Leg Stand 30 seconds             Post Biometrics - 10/09/21 4786        Post   Biometrics   Height 5' 11.5" (1.816 m)    Weight 174 lb 9.6 oz (79.2 kg)    BMI (Calculated) 24.01    Single Leg Stand 30 seconds             Nutrition:  Nutrition Therapy & Goals - 06/26/21 1018       Nutrition Therapy   Diet Heart healthy, low Na, pulmonary MNT    Protein (specify units) 95g    Fiber 30 grams    Whole Grain Foods 3 servings    Saturated Fats 12 max. grams    Fruits and Vegetables 8 servings/day    Sodium 1.5 grams      Personal Nutrition Goals   Nutrition Goal ST: have 1 good protein source at each meal/snack LT: limit Na <1.5g, meet protein and calorie needs consistently    Comments He has surgeries on his lungs in November - it is experimental and he may be a part of the placebo group. He feels short of breath after cooking. He has small frequent meals (4-6 meals) and has some shortness of breath. B: biscuit with country ham L: pot pie D: shrimp or fish 1x/week, beef stew, pork with rice and potatoes and salads and other green vegetables. He gets a good variety of vegetables. He adds spices and not much salt, bacon grease sometimes, olive oil mostly with cooking. He enjoys cheese and eggs as well, he has peanuts as snack with sundrop. Drinks: 1 gallon of milk every two day, sundrop, sometimes rum and coke. Discussed heart healthy eating and pulmonary MNT.      Intervention Plan   Intervention Prescribe, educate and counsel regarding individualized specific dietary modifications aiming towards targeted core components such as weight, hypertension, lipid management, diabetes, heart failure and other comorbidities.    Expected Outcomes Short Term Goal: Understand basic principles of dietary content, such as calories, fat, sodium, cholesterol and nutrients.;Short Term Goal: A plan has been developed with personal nutrition goals set during dietitian appointment.;Long Term Goal: Adherence to prescribed nutrition plan.  Nutrition  Discharge:   Education Questionnaire Score:  Knowledge Questionnaire Score - 10/18/21 0902       Knowledge Questionnaire Score   Pre Score 17/18: Oxygen    Post Score 18/18             Goals reviewed with patient; copy given to patient.

## 2022-05-13 ENCOUNTER — Other Ambulatory Visit: Payer: Self-pay

## 2022-05-13 ENCOUNTER — Emergency Department: Payer: No Typology Code available for payment source

## 2022-05-13 ENCOUNTER — Emergency Department
Admission: EM | Admit: 2022-05-13 | Discharge: 2022-05-13 | Disposition: A | Discharge status: Other Acute Inpatient Hospital | Payer: No Typology Code available for payment source | Attending: Emergency Medicine | Admitting: Emergency Medicine

## 2022-05-13 DIAGNOSIS — J441 Chronic obstructive pulmonary disease with (acute) exacerbation: Secondary | ICD-10-CM | POA: Diagnosis not present

## 2022-05-13 DIAGNOSIS — I10 Essential (primary) hypertension: Secondary | ICD-10-CM | POA: Diagnosis not present

## 2022-05-13 DIAGNOSIS — Z20822 Contact with and (suspected) exposure to covid-19: Secondary | ICD-10-CM | POA: Diagnosis not present

## 2022-05-13 DIAGNOSIS — R0602 Shortness of breath: Secondary | ICD-10-CM | POA: Diagnosis present

## 2022-05-13 DIAGNOSIS — I214 Non-ST elevation (NSTEMI) myocardial infarction: Secondary | ICD-10-CM

## 2022-05-13 DIAGNOSIS — R0902 Hypoxemia: Secondary | ICD-10-CM

## 2022-05-13 LAB — CBC WITH DIFFERENTIAL/PLATELET
Abs Immature Granulocytes: 0.04 10*3/uL (ref 0.00–0.07)
Basophils Absolute: 0.1 10*3/uL (ref 0.0–0.1)
Basophils Relative: 1 %
Eosinophils Absolute: 0.3 10*3/uL (ref 0.0–0.5)
Eosinophils Relative: 4 %
HCT: 45.1 % (ref 39.0–52.0)
Hemoglobin: 14.9 g/dL (ref 13.0–17.0)
Immature Granulocytes: 1 %
Lymphocytes Relative: 17 %
Lymphs Abs: 1.4 10*3/uL (ref 0.7–4.0)
MCH: 33 pg (ref 26.0–34.0)
MCHC: 33 g/dL (ref 30.0–36.0)
MCV: 99.8 fL (ref 80.0–100.0)
Monocytes Absolute: 0.5 10*3/uL (ref 0.1–1.0)
Monocytes Relative: 7 %
Neutro Abs: 5.6 10*3/uL (ref 1.7–7.7)
Neutrophils Relative %: 70 %
Platelets: 307 10*3/uL (ref 150–400)
RBC: 4.52 MIL/uL (ref 4.22–5.81)
RDW: 12.7 % (ref 11.5–15.5)
WBC: 7.9 10*3/uL (ref 4.0–10.5)
nRBC: 0 % (ref 0.0–0.2)

## 2022-05-13 LAB — APTT: aPTT: 23 seconds — ABNORMAL LOW (ref 24–36)

## 2022-05-13 LAB — PROTIME-INR
INR: 1 (ref 0.8–1.2)
Prothrombin Time: 13.4 seconds (ref 11.4–15.2)

## 2022-05-13 LAB — COMPREHENSIVE METABOLIC PANEL
ALT: 30 U/L (ref 0–44)
AST: 30 U/L (ref 15–41)
Albumin: 3.8 g/dL (ref 3.5–5.0)
Alkaline Phosphatase: 50 U/L (ref 38–126)
Anion gap: 8 (ref 5–15)
BUN: 13 mg/dL (ref 8–23)
CO2: 25 mmol/L (ref 22–32)
Calcium: 8.2 mg/dL — ABNORMAL LOW (ref 8.9–10.3)
Chloride: 105 mmol/L (ref 98–111)
Creatinine, Ser: 1.06 mg/dL (ref 0.61–1.24)
GFR, Estimated: >60 mL/min (ref >60)
Glucose, Bld: 149 mg/dL — ABNORMAL HIGH (ref 70–99)
Potassium: 4.4 mmol/L (ref 3.5–5.1)
Sodium: 138 mmol/L (ref 135–145)
Total Bilirubin: 0.6 mg/dL (ref 0.3–1.2)
Total Protein: 7.2 g/dL (ref 6.5–8.1)

## 2022-05-13 LAB — TROPONIN I (HIGH SENSITIVITY)
Troponin I (High Sensitivity): 46 ng/L — ABNORMAL HIGH (ref <18)
Troponin I (High Sensitivity): 203 ng/L (ref <18)

## 2022-05-13 LAB — RESP PANEL BY RT-PCR (FLU A&B, COVID) ARPGX2
Influenza A by PCR: NEGATIVE
Influenza B by PCR: NEGATIVE
SARS Coronavirus 2 by RT PCR: NEGATIVE

## 2022-05-13 LAB — LACTIC ACID, PLASMA: Lactic Acid, Venous: 2.2 mmol/L (ref 0.5–1.9)

## 2022-05-13 MED ORDER — HEPARIN SODIUM (PORCINE) 5000 UNIT/ML IJ SOLN
4000.0000 [IU] | Freq: Once | INTRAMUSCULAR | Status: AC
  Administered 2022-05-13: 4000 [IU] via INTRAVENOUS
  Filled 2022-05-13: qty 1

## 2022-05-13 MED ORDER — SODIUM CHLORIDE 0.9 % IV SOLN
100.0000 mg | Freq: Once | INTRAVENOUS | Status: AC
  Administered 2022-05-13: 100 mg via INTRAVENOUS
  Filled 2022-05-13: qty 100

## 2022-05-13 MED ORDER — IPRATROPIUM-ALBUTEROL 0.5-2.5 (3) MG/3ML IN SOLN
3.0000 mL | Freq: Once | RESPIRATORY_TRACT | Status: AC
  Administered 2022-05-13: 3 mL via RESPIRATORY_TRACT
  Filled 2022-05-13: qty 3

## 2022-05-13 MED ORDER — LACTATED RINGERS IV BOLUS
1000.0000 mL | Freq: Once | INTRAVENOUS | Status: AC
  Administered 2022-05-13: 1000 mL via INTRAVENOUS

## 2022-05-13 MED ORDER — ASPIRIN 81 MG PO CHEW
324.0000 mg | CHEWABLE_TABLET | Freq: Once | ORAL | Status: AC
  Administered 2022-05-13: 324 mg via ORAL
  Filled 2022-05-13: qty 4

## 2022-05-13 MED ORDER — HEPARIN (PORCINE) 25000 UT/250ML-% IV SOLN
950.0000 [IU]/h | INTRAVENOUS | Status: DC
  Administered 2022-05-13: 950 [IU]/h via INTRAVENOUS

## 2022-05-13 NOTE — ED Notes (Signed)
2.2 lactate acid reported to Dr Katrinka Blazing

## 2022-05-13 NOTE — ED Notes (Signed)
Dr Katrinka Blazing notified of 203 trop

## 2022-05-13 NOTE — ED Notes (Signed)
Report given to [facility] at this time.   

## 2022-05-13 NOTE — Progress Notes (Signed)
ANTICOAGULATION CONSULT NOTE - Initial Consult  Pharmacy Consult for Heparin  Indication: chest pain/ACS  Allergies  Allergen Reactions   Morphine And Related Shortness Of Breath    Patient Measurements: Height: 5\' 11"  (180.3 cm) Weight: 78.9 kg (173 lb 14.4 oz) IBW/kg (Calculated) : 75.3 Heparin Dosing Weight: 78.9 kg   Vital Signs: Temp: 97.5 F (36.4 C) (09/04 0324) Temp Source: Axillary (09/04 0150) BP: 150/66 (09/04 0400) Pulse Rate: 74 (09/04 0400)  Labs: Recent Labs    05/13/22 0142 05/13/22 0352  HGB 14.9  --   HCT 45.1  --   PLT 307  --   CREATININE 1.06  --   TROPONINIHS 46* 203*    Estimated Creatinine Clearance: 72 mL/min (by C-G formula based on SCr of 1.06 mg/dL).   Medical History: Past Medical History:  Diagnosis Date   COPD (chronic obstructive pulmonary disease) (HCC)     Medications:  (Not in a hospital admission)   Assessment: Pharmacy consulted to dose heparin in this 66 year old admitted with ACS/NSTEMI.  No prior anticoag noted.  CrCl = 72 ml/min  Goal of Therapy:  Heparin level 0.3-0.7 units/ml Monitor platelets by anticoagulation protocol: Yes   Plan:  Give 4000 units bolus x 1 Start heparin infusion at 950 units/hr Check anti-Xa level in 6 hours and daily while on heparin Continue to monitor H&H and platelets  Isaac Harrison D 05/13/2022,4:47 AM

## 2022-05-13 NOTE — ED Triage Notes (Signed)
To room 4 via [facility] from home with c/o sob Initial RA sats 80%. Hx of COPD Cpap in use by EMS  4 neb tx  2 gram Mag 125mg  SoluMedrol with improvement to 97%

## 2022-05-13 NOTE — ED Notes (Signed)
Pt taken off Bipap and placed on 4l [place] per [facility]

## 2022-05-13 NOTE — ED Provider Notes (Addendum)
[facility] Provider Note    Event Date/Time   First MD Initiated Contact with Patient 05/13/22 0136     (approximate)   History   Shortness of Breath   HPI  Isaac Harrison is a 66 y.o. male who presents to the ED for evaluation of Shortness of Breath   I reviewed pulmonary clinic visit from 6/27.  History of COPD on room air, HTN.  Recently enrolled in a cryo spray clinical trial.  43-pack-year smoker history.  Patient presents from home for evaluation of shortness of breath developing just today.  Reports minimal increased sputum production from his baseline without fevers, chest pain, syncope, domino pain or emesis.  Tried some nebulizers at home without improvement.  EMS found him hypoxic to the low 80s and placed him on CPAP.  Physical Exam   Triage Vital Signs: ED Triage Vitals  Enc Vitals Group     BP --      Pulse Rate 05/13/22 0133 91     Resp 05/13/22 0133 19     Temp --      Temp src --      SpO2 05/13/22 0133 98 %     Weight 05/13/22 0134 173 lb 14.4 oz (78.9 kg)     Height 05/13/22 0134 5\' 11"  (1.803 m)     Head Circumference --      Peak Flow --      Pain Score 05/13/22 0133 0     Pain Loc --      Pain Edu? --      Excl. in GC? --     Most recent vital signs: Vitals:   05/13/22 0324 05/13/22 0400  BP:  (!) 150/66  Pulse:  74  Resp:  (!) 25  Temp: (!) 97.5 F (36.4 C)   SpO2:  96%    General: Awake.  Sitting upright on our BiPAP.  Tachypneic to the mid 20s.  Able to answer questions and follow commands. CV:  Good peripheral perfusion.  Resp:  Mild tachycardia needed to the mid 20s.  No distress.  No tripoding.  Sitting back, but upright.  Wheezing throughout with poor airflow. Abd:  No distention.  MSK:  No deformity noted.  No swelling or edema Neuro:  No focal deficits appreciated. Other:     ED Results / Procedures / Treatments   Labs (all labs ordered are listed, but only abnormal results are  displayed) Labs Reviewed  COMPREHENSIVE METABOLIC PANEL - Abnormal; Notable for the following components:      Result Value   Glucose, Bld 149 (*)    Calcium 8.2 (*)    All other components within normal limits  LACTIC ACID, PLASMA - Abnormal; Notable for the following components:   Lactic Acid, Venous 2.2 (*)    All other components within normal limits  TROPONIN I (HIGH SENSITIVITY) - Abnormal; Notable for the following components:   Troponin I (High Sensitivity) 46 (*)    All other components within normal limits  TROPONIN I (HIGH SENSITIVITY) - Abnormal; Notable for the following components:   Troponin I (High Sensitivity) 203 (*)    All other components within normal limits  RESP PANEL BY RT-PCR (FLU A&B, COVID) ARPGX2  CULTURE, BLOOD (ROUTINE X 2)  CULTURE, BLOOD (ROUTINE X 2)  CBC WITH DIFFERENTIAL/PLATELET  APTT  PROTIME-INR    EKG Tremulous baseline makes fine detail difficult to interpret, but seems to demonstrate a sinus rhythm with a rate  of 92 bpm.  Normal axis and intervals.  No clear signs of acute ischemia.  RADIOLOGY CXR interpreted by me without evidence of acute cardiopulmonary pathology.  Official radiology report(s): DG Chest Portable 1 View  Result Date: 05/13/2022 CLINICAL DATA:  Dyspnea EXAM: PORTABLE CHEST 1 VIEW COMPARISON:  04/07/2018 FINDINGS: Mild pulmonary hyperinflation and paucity of vasculature within the lung apices bilaterally are in keeping with changes of underlying emphysema. Nodular density at the right lung base peripherally likely represents a nipple shadow. No superimposed focal pulmonary infiltrate. No pneumothorax or pleural effusion. Cardiac size within normal limits. Pulmonary vascularity is otherwise normal. No acute bone abnormality. IMPRESSION: 1. No acute cardiopulmonary disease. 2. COPD. Electronically Signed   By: Helyn Numbers M.D.   On: 05/13/2022 02:29    PROCEDURES and INTERVENTIONS:  .1-3 Lead EKG  Interpretation  Performed by: Delton Prairie, MD Authorized by: Delton Prairie, MD     Interpretation: normal     ECG rate:  80   ECG rate assessment: normal     Rhythm: sinus rhythm     Ectopy: none     Conduction: normal   .Critical Care  Performed by: Delton Prairie, MD Authorized by: Delton Prairie, MD   Critical care provider statement:    Critical care time (minutes):  30   Critical care time was exclusive of:  Separately billable procedures and treating other patients   Critical care was necessary to treat or prevent imminent or life-threatening deterioration of the following conditions:  Respiratory failure   Critical care was time spent personally by me on the following activities:  Development of treatment plan with patient or surrogate, discussions with consultants, evaluation of patient's response to treatment, examination of patient, ordering and review of laboratory studies, ordering and review of radiographic studies, ordering and performing treatments and interventions, pulse oximetry, re-evaluation of patient's condition and review of old charts   Medications  doxycycline (VIBRAMYCIN) 100 mg in sodium chloride 0.9 % 250 mL IVPB (has no administration in time range)  aspirin chewable tablet 324 mg (has no administration in time range)  heparin injection 4,000 Units (has no administration in time range)  ipratropium-albuterol (DUONEB) 0.5-2.5 (3) MG/3ML nebulizer solution 3 mL (3 mLs Nebulization Given 05/13/22 0207)  lactated ringers bolus 1,000 mL (1,000 mLs Intravenous New Bag/Given 05/13/22 0327)  ipratropium-albuterol (DUONEB) 0.5-2.5 (3) MG/3ML nebulizer solution 3 mL (3 mLs Nebulization Given 05/13/22 0346)     IMPRESSION / MDM / ASSESSMENT AND PLAN / ED COURSE  I reviewed the triage vital signs and the nursing notes.  Differential diagnosis includes, but is not limited to, COPD exacerbation, pneumonia, sepsis, PTX, ACS  {Patient presents with symptoms of an acute illness  or injury that is potentially life-threatening.  66 year old male with history of COPD presents to the ED with evidence of an exacerbation requiring BiPAP.  He is hypoxic and tachypneic.  Received steroids prehospital.  Improving clinical picture with BiPAP.  Has a mild lactic acidosis for which she receives IV fluids.  Repeat is pending.  Normal CBC and metabolic panel.  Troponin is slightly elevated, but I suspect this is secondary to his respiratory status.  Nonischemic EKG and negative COVID/flu testing.  Clinical Course as of 05/13/22 0432  Mon May 13, 2022  0334 Reassessed.  Looks well.  Had RT called to see if and get him off the BiPAP. [DS]  [id number] Dr. Daleen Squibb .  From the [facility].  We discussed patient presentation and work-up.  He  is happy to accept in transfer [DS]  0431 His second troponin jumps up more than anticipated, now greater than 200.  We will provide aspirin and start heparin. [DS]    Clinical Course User Index [DS] Delton Prairie, MD     FINAL CLINICAL IMPRESSION(S) / ED DIAGNOSES   Final diagnoses:  COPD exacerbation (HCC)  Hypoxia     Rx / DC Orders   ED Discharge Orders     None        Note:  This document was prepared using Dragon voice recognition software and may include unintentional dictation errors.   Delton Prairie, MD 05/13/22 0345    Delton Prairie, MD 05/13/22 [id number]    Delton Prairie, MD 05/13/22 [phone]

## 2022-05-18 LAB — CULTURE, BLOOD (ROUTINE X 2)
Culture: NO GROWTH
Culture: NO GROWTH
Special Requests: ADEQUATE
Special Requests: ADEQUATE

## 2024-02-04 ENCOUNTER — Encounter: Payer: Self-pay | Admitting: Pulmonary Disease

## 2024-02-12 ENCOUNTER — Other Ambulatory Visit: Payer: Self-pay

## 2024-02-12 ENCOUNTER — Encounter: Attending: Pulmonary Disease

## 2024-02-12 DIAGNOSIS — J449 Chronic obstructive pulmonary disease, unspecified: Secondary | ICD-10-CM | POA: Insufficient documentation

## 2024-02-12 NOTE — Progress Notes (Signed)
 Virtual Visit completed. Patient informed on EP and RD appointment and 6 Minute walk test. Patient also informed of patient health questionnaires on My Chart. Patient Verbalizes understanding. Visit diagnosis can be found in [facility] media. Patient [facility].

## 2024-02-16 ENCOUNTER — Encounter

## 2024-02-16 VITALS — Ht 71.25 in | Wt 158.3 lb

## 2024-02-16 DIAGNOSIS — J449 Chronic obstructive pulmonary disease, unspecified: Secondary | ICD-10-CM | POA: Diagnosis present

## 2024-02-16 NOTE — Progress Notes (Signed)
 Pulmonary Individual Treatment Plan  Patient Details  Name: Isaac Harrison MRN: [id number] Date of Birth: [birth year] Referring Provider:   Flowsheet Row Pulmonary Rehab from 02/16/2024 in [facility] Cardiac and Pulmonary Rehab  Referring Provider Dr. Kaylee Partridge, MD       Initial Encounter Date:  Flowsheet Row Pulmonary Rehab from 02/16/2024 in [facility] Cardiac and Pulmonary Rehab  Date 02/16/24       Visit Diagnosis: Stage 3 severe COPD by GOLD classification (HCC)  Patient's Home Medications on Admission:  Current Outpatient Medications:    albuterol  (PROVENTIL  HFA;VENTOLIN  HFA) 108 (90 Base) MCG/ACT inhaler, Inhale 2 puffs into the lungs every 6 (six) hours as needed for wheezing or shortness of breath., Disp: 1 Inhaler, Rfl: 0   albuterol  (PROVENTIL ) (2.5 MG/3ML) 0.083% nebulizer solution, USE 1 AMPULE BY ORAL INHALATION FOUR TIMES A DAY AS NEEDED FOR BREATHING / SHORTNESS OF BREATH, Disp: , Rfl:    albuterol  (PROVENTIL ) (2.5 MG/3ML) 0.083% nebulizer solution, Inhale into the lungs., Disp: , Rfl:    albuterol  (VENTOLIN  HFA) 108 (90 Base) MCG/ACT inhaler, 1 puff as needed Inhalation every 4 hrs, Disp: , Rfl:    amLODipine (NORVASC) 10 MG tablet, Take by mouth. (Patient not taking: Reported on 02/12/2024), Disp: , Rfl:    amLODipine-benazepril (LOTREL) 5-10 MG capsule, Take by mouth., Disp: , Rfl:    aspirin  EC 81 MG tablet, Take 81 mg by mouth., Disp: , Rfl:    atorvastatin (LIPITOR) 40 MG tablet, Take 20 mg by mouth., Disp: , Rfl:    azelastine (ASTELIN) 0.1 % nasal spray, USE 1 TO 2 SPRAYS IN INTO EACH NOSTRIL TWO TIMES A DAY AS DIRECTED, Disp: , Rfl:    azithromycin  (ZITHROMAX  Z-PAK) 250 MG tablet, Take 2 tablets (500 mg) on  Day 1,  followed by 1 tablet (250 mg) once daily on Days 2 through 5. (Patient not taking: Reported on 04/07/2018), Disp: 6 each, Rfl: 0   Budeson-Glycopyrrol-Formoterol (BREZTRI AEROSPHERE) 160-9-4.8 MCG/ACT AERO, Inhale 2 Inhalers into the lungs 2 (two) times daily.,  Disp: , Rfl:    budesonide-glycopyrrolate-formoterol (BREZTRI AEROSPHERE) 160-9-4.8 MCG/ACT AERO inhaler, 2 puffs Inhalation Twice a day, Disp: , Rfl:    Calcium Carb-Cholecalciferol 600-10 MG-MCG TABS, Take 1 tablet by mouth 2 (two) times daily., Disp: , Rfl:    cetirizine (ZYRTEC) 10 MG tablet, Take 1 tablet by mouth daily., Disp: , Rfl:    doxycycline  (MONODOX ) 100 MG capsule, Take 100 mg by mouth., Disp: , Rfl:    Dupilumab 300 MG/2ML SOAJ, Inject into the skin. (Patient not taking: Reported on 02/12/2024), Disp: , Rfl:    fluticasone (FLONASE) 50 MCG/ACT nasal spray, INSTILL 2 SPRAYS INTO EACH NOSTRIL ONCE EVERY DAY MAXIMUM 2 SPRAYS IN EACH NOSTRIL DAILY. FOR ALLERGIES, Disp: , Rfl:    fluticasone (FLONASE) 50 MCG/ACT nasal spray, 1-2 sprays Nasally Once a day, Disp: , Rfl:    guaiFENesin (MUCINEX) 600 MG 12 hr tablet, Take 1 tablet by mouth 2 (two) times daily., Disp: , Rfl:    methylPREDNISolone (MEDROL DOSEPAK) 4 MG TBPK tablet, See admin instructions., Disp: , Rfl:    predniSONE  (DELTASONE ) 20 MG tablet, Take 3 tablets (60 mg total) by mouth daily., Disp: 12 tablet, Rfl: 0   predniSONE  (DELTASONE ) 20 MG tablet, Take 3 tablets (60 mg total) by mouth daily. (Patient not taking: Reported on 02/12/2024), Disp: 12 tablet, Rfl: 0   Spacer/Aero Chamber Mouthpiece MISC, 1 Units by Does not apply route every 4 (four) hours as needed (  wheezing)., Disp: 1 each, Rfl: 0   tiotropium (SPIRIVA) 18 MCG inhalation capsule, Place 18 mcg into inhaler and inhale., Disp: , Rfl:   Past Medical History: Past Medical History:  Diagnosis Date   COPD (chronic obstructive pulmonary disease) (HCC)     Tobacco Use: Social History   Tobacco Use  Smoking Status Former   Current packs/day: 0.00   Average packs/day: 1.5 packs/day for 35.0 years (52.5 ttl pk-yrs)   Types: Cigarettes   Start date: 03/09/1986   Quit date: 03/09/2021   Years since quitting: 2.9  Smokeless Tobacco Never  Tobacco Comments   Quit   not  smoking any at all    Labs: Review Flowsheet       Latest Ref Rng & Units 04/07/2018  Labs for ITP Cardiac and Pulmonary Rehab  Bicarbonate 20.0 - 28.0 mmol/L 27.1   Acid-base deficit 0.0 - 2.0 mmol/L 0.6      Pulmonary Assessment Scores:  Pulmonary Assessment Scores     Row Name 02/16/24 1013         ADL UCSD   ADL Phase Entry     SOB Score total 59     Rest 0     Walk 2     Stairs 4     Bath 2     Dress 2     Shop 4       CAT Score   CAT Score 20       mMRC Score   mMRC Score 2              UCSD: Self-administered rating of dyspnea associated with activities of daily living (ADLs) 6-point scale (0 = "not at all" to 5 = "maximal or unable to do because of breathlessness")  Scoring Scores range from 0 to 120.  Minimally important difference is 5 units  CAT: CAT can identify the health impairment of COPD patients and is better correlated with disease progression.  CAT has a scoring range of zero to 40. The CAT score is classified into four groups of low (less than 10), medium (10 - 20), high (21-30) and very high (31-40) based on the impact level of disease on health status. A CAT score over 10 suggests significant symptoms.  A worsening CAT score could be explained by an exacerbation, poor medication adherence, poor inhaler technique, or progression of COPD or comorbid conditions.  CAT MCID is 2 points  mMRC: mMRC (Modified Medical Research Council) Dyspnea Scale is used to assess the degree of baseline functional disability in patients of respiratory disease due to dyspnea. No minimal important difference is established. A decrease in score of 1 point or greater is considered a positive change.   Pulmonary Function Assessment:  Pulmonary Function Assessment - 02/12/24 1014       Post Bronchodilator Spirometry Results   FEV1% 41 %    FEV1/FVC Ratio 43.03      Breath   Shortness of Breath Yes;Limiting activity;Panic with Shortness of Breath              Exercise Target Goals: Exercise Program Goal: Individual exercise prescription set using results from initial 6 min walk test and THRR while considering  patient's activity barriers and safety.   Exercise Prescription Goal: Initial exercise prescription builds to 30-45 minutes a day of aerobic activity, 2-3 days per week.  Home exercise guidelines will be given to patient during program as part of exercise prescription that the participant will acknowledge.  Education:  Aerobic Exercise: - Group verbal and visual presentation on the components of exercise prescription. Introduces F.I.T.T principle from ACSM for exercise prescriptions.  Reviews F.I.T.T. principles of aerobic exercise including progression. Written material given at graduation. Flowsheet Row Pulmonary Rehab from 10/18/2021 in [facility] Cardiac and Pulmonary Rehab  Date 07/05/21  Educator [facility]  Instruction Review Code 1- Verbalizes Understanding       Education: Resistance Exercise: - Group verbal and visual presentation on the components of exercise prescription. Introduces F.I.T.T principle from ACSM for exercise prescriptions  Reviews F.I.T.T. principles of resistance exercise including progression. Written material given at graduation.    Education: Exercise & Equipment Safety: - Individual verbal instruction and demonstration of equipment use and safety with use of the equipment. Flowsheet Row Pulmonary Rehab from 02/16/2024 in [facility] Cardiac and Pulmonary Rehab  Date 02/12/24  Educator [facility]  Instruction Review Code 1- Verbalizes Understanding       Education: Exercise Physiology & General Exercise Guidelines: - Group verbal and written instruction with models to review the exercise physiology of the cardiovascular system and associated critical values. Provides general exercise guidelines with specific guidelines to those with heart or lung disease.  Flowsheet Row Pulmonary Rehab from 10/18/2021 in [facility] Cardiac and Pulmonary  Rehab  Date 06/28/21  Educator [facility]  Instruction Review Code 1- Verbalizes Understanding       Education: Flexibility, Balance, Mind/Body Relaxation: - Group verbal and visual presentation with interactive activity on the components of exercise prescription. Introduces F.I.T.T principle from ACSM for exercise prescriptions. Reviews F.I.T.T. principles of flexibility and balance exercise training including progression. Also discusses the mind body connection.  Reviews various relaxation techniques to help reduce and manage stress (i.e. Deep breathing, progressive muscle relaxation, and visualization). Balance handout provided to take home. Written material given at graduation. Flowsheet Row Pulmonary Rehab from 10/18/2021 in [facility] Cardiac and Pulmonary Rehab  Date 07/19/21  Educator [facility]  Instruction Review Code 1- Verbalizes Understanding       Activity Barriers & Risk Stratification:  Activity Barriers & Cardiac Risk Stratification - 02/16/24 1023       Activity Barriers & Cardiac Risk Stratification   Activity Barriers Deconditioning;Shortness of Breath             6 Minute Walk:  6 Minute Walk     Row Name 02/16/24 1023         6 Minute Walk   Phase Initial     Distance 1145 feet     Walk Time 6 minutes     # of Rest Breaks 0     MPH 2.17     METS 3.4     RPE 15     Perceived Dyspnea  3     VO2 Peak 11.89     Symptoms No     Resting HR 72 bpm     Resting BP 112/66     Resting Oxygen Saturation  96 %     Exercise Oxygen Saturation  during 6 min walk 87 %     Max Ex. HR 103 bpm     Max Ex. BP 164/72     2 Minute Post BP 152/70       Interval HR   1 Minute HR 87     2 Minute HR 95     3 Minute HR 97     4 Minute HR 94     5 Minute HR 103     6 Minute HR 103  2 Minute Post HR 89     Interval Heart Rate? Yes       Interval Oxygen   Interval Oxygen? Yes     Baseline Oxygen Saturation % 96 %     1 Minute Oxygen Saturation % 94 %     1 Minute Liters of  Oxygen 0 L  RA     2 Minute Oxygen Saturation % 92 %     2 Minute Liters of Oxygen 0 L     3 Minute Oxygen Saturation % 89 %     3 Minute Liters of Oxygen 0 L     4 Minute Oxygen Saturation % 91 %     4 Minute Liters of Oxygen 0 L     5 Minute Oxygen Saturation % 87 %     5 Minute Liters of Oxygen 0 L     6 Minute Oxygen Saturation % 88 %     6 Minute Liters of Oxygen 0 L     2 Minute Post Oxygen Saturation % 96 %     2 Minute Post Liters of Oxygen 0 L             Oxygen Initial Assessment:  Oxygen Initial Assessment - 02/12/24 1014       Home Oxygen   Home Oxygen Device None    Sleep Oxygen Prescription None    Home Exercise Oxygen Prescription None    Home Resting Oxygen Prescription None      Initial 6 min Walk   Oxygen Used None      Program Oxygen Prescription   Program Oxygen Prescription None      Intervention   Short Term Goals To learn and understand importance of maintaining oxygen saturations>88%;To learn and exhibit compliance with exercise, home and travel O2 prescription;To learn and demonstrate proper use of respiratory medications;To learn and demonstrate proper pursed lip breathing techniques or other breathing techniques. ;To learn and understand importance of monitoring SPO2 with pulse oximeter and demonstrate accurate use of the pulse oximeter.    Long  Term Goals Verbalizes importance of monitoring SPO2 with pulse oximeter and return demonstration;Exhibits proper breathing techniques, such as pursed lip breathing or other method taught during program session;Demonstrates proper use of MDI's;Compliance with respiratory medication;Maintenance of O2 saturations>88%;Exhibits compliance with exercise, home  and travel O2 prescription             Oxygen Re-Evaluation:   Oxygen Discharge (Final Oxygen Re-Evaluation):   Initial Exercise Prescription:  Initial Exercise Prescription - 02/16/24 1000       Date of Initial Exercise RX and Referring  Provider   Date 02/16/24    Referring Provider Dr. Kaylee Partridge, MD      Oxygen   Maintain Oxygen Saturation 88% or higher      NuStep   Level 3    SPM 80    Minutes 15    METs 3.4      REL-XR   Level 2    Speed 50    Minutes 15    METs 3.4      Biostep-RELP   Level 2    SPM 50    Minutes 15    METs 3.4      Track   Laps 32    Minutes 15    METs 2.74      Prescription Details   Frequency (times per week) 2    Duration Progress to 30 minutes of continuous aerobic without  signs/symptoms of physical distress      Intensity   THRR 40-80% of Max Heartrate 103-135    Ratings of Perceived Exertion 11-13    Perceived Dyspnea 0-4      Progression   Progression Continue to progress workloads to maintain intensity without signs/symptoms of physical distress.      Resistance Training   Training Prescription Yes    Weight 4 lb    Reps 10-15             Perform Capillary Blood Glucose checks as needed.  Exercise Prescription Changes:   Exercise Prescription Changes     Row Name 02/16/24 1000             Response to Exercise   Blood Pressure (Admit) 112/66       Blood Pressure (Exercise) 164/72       Blood Pressure (Exit) 152/70       Heart Rate (Admit) 72 bpm       Heart Rate (Exercise) 103 bpm       Heart Rate (Exit) 89 bpm       Oxygen Saturation (Admit) 96 %       Oxygen Saturation (Exercise) 87 %       Oxygen Saturation (Exit) 96 %       Rating of Perceived Exertion (Exercise) 15       Perceived Dyspnea (Exercise) 3       Symptoms none       Comments Results                Exercise Comments:   Exercise Goals and Review:   Exercise Goals     Row Name 02/16/24 1022             Exercise Goals   Increase Physical Activity Yes       Intervention Provide advice, education, support and counseling about physical activity/exercise needs.;Develop an individualized exercise prescription for aerobic and resistive training based on  initial evaluation findings, risk stratification, comorbidities and participant's personal goals.       Expected Outcomes Short Term: Attend rehab on a regular basis to increase amount of physical activity.;Long Term: Add in home exercise to make exercise part of routine and to increase amount of physical activity.;Long Term: Exercising regularly at least 3-5 days a week.       Increase Strength and Stamina Yes       Intervention Provide advice, education, support and counseling about physical activity/exercise needs.;Develop an individualized exercise prescription for aerobic and resistive training based on initial evaluation findings, risk stratification, comorbidities and participant's personal goals.       Expected Outcomes Short Term: Increase workloads from initial exercise prescription for resistance, speed, and METs.;Short Term: Perform resistance training exercises routinely during rehab and add in resistance training at home;Long Term: Improve cardiorespiratory fitness, muscular endurance and strength as measured by increased METs and functional capacity ( )       Able to understand and use rate of perceived exertion (RPE) scale Yes       Intervention Provide education and explanation on how to use RPE scale       Expected Outcomes Short Term: Able to use RPE daily in rehab to express subjective intensity level;Long Term:  Able to use RPE to guide intensity level when exercising independently       Able to understand and use Dyspnea scale Yes       Intervention Provide education and explanation on how  to use Dyspnea scale       Expected Outcomes Short Term: Able to use Dyspnea scale daily in rehab to express subjective sense of shortness of breath during exertion;Long Term: Able to use Dyspnea scale to guide intensity level when exercising independently       Knowledge and understanding of Target Heart Rate Range (THRR) Yes       Intervention Provide education and explanation of THRR  including how the numbers were predicted and where they are located for reference       Expected Outcomes Long Term: Able to use THRR to govern intensity when exercising independently;Short Term: Able to state/look up THRR;Short Term: Able to use daily as guideline for intensity in rehab       Able to check pulse independently Yes       Intervention Provide education and demonstration on how to check pulse in carotid and radial arteries.;Review the importance of being able to check your own pulse for safety during independent exercise       Expected Outcomes Short Term: Able to explain why pulse checking is important during independent exercise;Long Term: Able to check pulse independently and accurately       Understanding of Exercise Prescription Yes       Intervention Provide education, explanation, and written materials on patient's individual exercise prescription       Expected Outcomes Short Term: Able to explain program exercise prescription;Long Term: Able to explain home exercise prescription to exercise independently                Exercise Goals Re-Evaluation :   Discharge Exercise Prescription (Final Exercise Prescription Changes):  Exercise Prescription Changes - 02/16/24 1000       Response to Exercise   Blood Pressure (Admit) 112/66    Blood Pressure (Exercise) 164/72    Blood Pressure (Exit) 152/70    Heart Rate (Admit) 72 bpm    Heart Rate (Exercise) 103 bpm    Heart Rate (Exit) 89 bpm    Oxygen Saturation (Admit) 96 %    Oxygen Saturation (Exercise) 87 %    Oxygen Saturation (Exit) 96 %    Rating of Perceived Exertion (Exercise) 15    Perceived Dyspnea (Exercise) 3    Symptoms none    Comments Results             Nutrition:  Target Goals: Understanding of nutrition guidelines, daily intake of sodium 1500mg , cholesterol 200mg , calories 30% from fat and 7% or less from saturated fats, daily to have 5 or more servings of fruits and  vegetables.  Education: All About Nutrition: -Group instruction provided by verbal, written material, interactive activities, discussions, models, and posters to present general guidelines for heart healthy nutrition including fat, fiber, MyPlate, the role of sodium in heart healthy nutrition, utilization of the nutrition label, and utilization of this knowledge for meal planning. Follow up email sent as well. Written material given at graduation. Flowsheet Row Pulmonary Rehab from 10/18/2021 in [facility] Cardiac and Pulmonary Rehab  Date 05/31/21  Educator [facility]  Instruction Review Code 1- Verbalizes Understanding       Biometrics:  Pre Biometrics - 02/16/24 1021       Pre Biometrics   Height 5' 11.25" (1.81 m)    Weight 158 lb 4.8 oz (71.8 kg)    Waist Circumference 36 inches    Hip Circumference 35 inches    Waist to Hip Ratio 1.03 %    BMI (Calculated) 21.92  Single Leg Stand 30 seconds              Nutrition Therapy Plan and Nutrition Goals:  Nutrition Therapy & Goals - 02/16/24 1017       Nutrition Therapy   RD appointment deferred Yes      Intervention Plan   Intervention Prescribe, educate and counsel regarding individualized specific dietary modifications aiming towards targeted core components such as weight, hypertension, lipid management, diabetes, heart failure and other comorbidities.    Expected Outcomes Short Term Goal: Understand basic principles of dietary content, such as calories, fat, sodium, cholesterol and nutrients.;Short Term Goal: A plan has been developed with personal nutrition goals set during dietitian appointment.;Long Term Goal: Adherence to prescribed nutrition plan.             Nutrition Assessments:  MEDIFICTS Score Key: >=70 Need to make dietary changes  40-70 Heart Healthy Diet <= 40 Therapeutic Level Cholesterol Diet  Flowsheet Row Pulmonary Rehab from 02/16/2024 in [facility] Cardiac and Pulmonary Rehab  Picture Your Plate Total Score on  Admission 55      Picture Your Plate Scores: <16 Unhealthy dietary pattern with much room for improvement. 41-50 Dietary pattern unlikely to meet recommendations for good health and room for improvement. 51-60 More healthful dietary pattern, with some room for improvement.  >60 Healthy dietary pattern, although there may be some specific behaviors that could be improved.   Nutrition Goals Re-Evaluation:   Nutrition Goals Discharge (Final Nutrition Goals Re-Evaluation):   Psychosocial: Target Goals: Acknowledge presence or absence of significant depression and/or stress, maximize coping skills, provide positive support system. Participant is able to verbalize types and ability to use techniques and skills needed for reducing stress and depression.   Education: Stress, Anxiety, and Depression - Group verbal and visual presentation to define topics covered.  Reviews how body is impacted by stress, anxiety, and depression.  Also discusses healthy ways to reduce stress and to treat/manage anxiety and depression.  Written material given at graduation. Flowsheet Row Pulmonary Rehab from 10/18/2021 in [facility] Cardiac and Pulmonary Rehab  Date 06/21/21  Educator [facility]  Instruction Review Code 1- [employer] Understanding       Education: Sleep Hygiene -Provides group verbal and written instruction about how sleep can affect your health.  Define sleep hygiene, discuss sleep cycles and impact of sleep habits. Review good sleep hygiene tips.    Initial Review & Psychosocial Screening:  Initial Psych Review & Screening - 02/12/24 1015       Initial Review   Current issues with None Identified      Family Dynamics   Good Support System? Yes    Comments Athena Bland can look to his wife, daughter and friends for support.      Barriers   Psychosocial barriers to participate in program The patient should benefit from training in stress management and relaxation.;There are no identifiable barriers or  psychosocial needs.      Screening Interventions   Interventions Provide feedback about the scores to participant;To provide support and resources with identified psychosocial needs;Encouraged to exercise    Expected Outcomes Short Term goal: Utilizing psychosocial counselor, staff and physician to assist with identification of specific Stressors or current issues interfering with healing process. Setting desired goal for each stressor or current issue identified.;Long Term Goal: Stressors or current issues are controlled or eliminated.;Short Term goal: Identification and review with participant of any Quality of Life or Depression concerns found by scoring the questionnaire.;Long Term goal: The participant  improves quality of Life and PHQ9 Scores as seen by post scores and/or verbalization of changes             Quality of Life Scores:  Scores of 19 and below usually indicate a poorer quality of life in these areas.  A difference of  2-3 points is a clinically meaningful difference.  A difference of 2-3 points in the total score of the Quality of Life Index has been associated with significant improvement in overall quality of life, self-image, physical symptoms, and general health in studies assessing change in quality of life.  PHQ-9: Review Flowsheet       02/16/2024 10/18/2021 06/21/2021 05/24/2021  Depression screen PHQ 2/9  Decreased Interest 1 0 1 1  Down, Depressed, Hopeless 0 0 0 0  PHQ - 2 Score 1 0 1 1  Altered sleeping 0 0 0 1  Tired, decreased energy 2 0 1 2  Change in appetite 0 0 0 0  Feeling bad or failure about yourself  0 0 0 0  Trouble concentrating 0 0 0 0  Moving slowly or fidgety/restless 1 0 1 1  Suicidal thoughts 0 0 0 0  PHQ-9 Score 4 0 3 5  Difficult doing work/chores Not difficult at all Somewhat difficult Somewhat difficult Not difficult at all   Interpretation of Total Score  Total Score Depression Severity:  1-4 = Minimal depression, 5-9 = Mild  depression, 10-14 = Moderate depression, 15-19 = Moderately severe depression, 20-27 = Severe depression   Psychosocial Evaluation and Intervention:  Psychosocial Evaluation - 02/12/24 1016       Psychosocial Evaluation & Interventions   Interventions Encouraged to exercise with the program and follow exercise prescription;Relaxation education;Stress management education    Comments Athena Bland can look to his wife, daughter and friends for support.    Expected Outcomes Short: Start [facility] to help with mood. Long: Maintain a healthy mental state    Continue Psychosocial Services  Follow up required by staff             Psychosocial Re-Evaluation:   Psychosocial Discharge (Final Psychosocial Re-Evaluation):   Education: Education Goals: Education classes will be provided on a weekly basis, covering required topics. Participant will state understanding/return demonstration of topics presented.  Learning Barriers/Preferences:  Learning Barriers/Preferences - 02/12/24 1013       Learning Barriers/Preferences   Learning Barriers Hearing    Learning Preferences None             General Pulmonary Education Topics:  Infection Prevention: - Provides verbal and written material to individual with discussion of infection control including proper hand washing and proper equipment cleaning during exercise session. Flowsheet Row Pulmonary Rehab from 02/16/2024 in [facility] Cardiac and Pulmonary Rehab  Date 02/12/24  Educator [facility]  Instruction Review Code 1- Verbalizes Understanding       Falls Prevention: - Provides verbal and written material to individual with discussion of falls prevention and safety. Flowsheet Row Pulmonary Rehab from 02/16/2024 in [facility] Cardiac and Pulmonary Rehab  Date 02/12/24  Educator [facility]  Instruction Review Code 1- Verbalizes Understanding       Chronic Lung Disease Review: - Group verbal instruction with posters, models, PowerPoint presentations and  videos,  to review new updates, new respiratory medications, new advancements in procedures and treatments. Providing information on websites and "800" numbers for continued self-education. Includes information about supplement oxygen, available portable oxygen systems, continuous and intermittent flow rates, oxygen safety, concentrators, and Medicare reimbursement for oxygen.  Explanation of Pulmonary Drugs, including class, frequency, complications, importance of spacers, rinsing mouth after steroid MDI's, and proper cleaning methods for nebulizers. Review of basic lung anatomy and physiology related to function, structure, and complications of lung disease. Review of risk factors. Discussion about methods for diagnosing sleep apnea and types of masks and machines for OSA. Includes a review of the use of types of environmental controls: home humidity, furnaces, filters, dust mite/pet prevention, HEPA vacuums. Discussion about weather changes, air quality and the benefits of nasal washing. Instruction on Warning signs, infection symptoms, calling MD promptly, preventive modes, and value of vaccinations. Review of effective airway clearance, coughing and/or vibration techniques. Emphasizing that all should Create an Action Plan. Written material given at graduation. Flowsheet Row Pulmonary Rehab from 02/16/2024 in [facility] Cardiac and Pulmonary Rehab  Education need identified 02/16/24       AED/CPR: - Group verbal and written instruction with the use of models to demonstrate the basic use of the AED with the basic ABC's of resuscitation.    Anatomy and Cardiac Procedures: - Group verbal and visual presentation and models provide information about basic cardiac anatomy and function. Reviews the testing methods done to diagnose heart disease and the outcomes of the test results. Describes the treatment choices: Medical Management, Angioplasty, or Coronary Bypass Surgery for treating various heart conditions  including Myocardial Infarction, Angina, Valve Disease, and Cardiac Arrhythmias.  Written material given at graduation.   Medication Safety: - Group verbal and visual instruction to review commonly prescribed medications for heart and lung disease. Reviews the medication, class of the drug, and side effects. Includes the steps to properly store meds and maintain the prescription regimen.  Written material given at graduation. Flowsheet Row Pulmonary Rehab from 10/18/2021 in [facility] Cardiac and Pulmonary Rehab  Date 09/27/21  Educator [facility]  Instruction Review Code 1- Verbalizes Understanding       Other: -Provides group and verbal instruction on various topics (see comments)   Knowledge Questionnaire Score:  Knowledge Questionnaire Score - 02/16/24 1016       Knowledge Questionnaire Score   Pre Score 17/18              Core Components/Risk Factors/Patient Goals at Admission:  Personal Goals and Risk Factors at Admission - 02/12/24 1014       Core Components/Risk Factors/Patient Goals on Admission    Weight Management Yes;Weight Maintenance    Intervention Weight Management: Develop a combined nutrition and exercise program designed to reach desired caloric intake, while maintaining appropriate intake of nutrient and fiber, sodium and fats, and appropriate energy expenditure required for the weight goal.;Weight Management: Provide education and appropriate resources to help participant work on and attain dietary goals.;Weight Management/Obesity: Establish reasonable short term and long term weight goals.    Expected Outcomes Short Term: Continue to assess and modify interventions until short term weight is achieved;Weight Maintenance: Understanding of the daily nutrition guidelines, which includes 25-35% calories from fat, 7% or less cal from saturated fats, less than 200mg  cholesterol, less than 1.5gm of sodium, & 5 or more servings of fruits and vegetables daily;Understanding  recommendations for meals to include 15-35% energy as protein, 25-35% energy from fat, 35-60% energy from carbohydrates, less than 200mg  of dietary cholesterol, 20-35 gm of total fiber daily;Understanding of distribution of calorie intake throughout the day with the consumption of 4-5 meals/snacks    Improve shortness of breath with ADL's Yes    Intervention Provide education, individualized exercise plan and daily activity instruction  to help decrease symptoms of SOB with activities of daily living.    Expected Outcomes Short Term: Improve cardiorespiratory fitness to achieve a reduction of symptoms when performing ADLs;Long Term: Be able to perform more ADLs without symptoms or delay the onset of symptoms    Hypertension Yes    Intervention Provide education on lifestyle modifcations including regular physical activity/exercise, weight management, moderate sodium restriction and increased consumption of fresh fruit, vegetables, and low fat dairy, alcohol moderation, and smoking cessation.;Monitor prescription use compliance.    Expected Outcomes Short Term: Continued assessment and intervention until BP is < 140/22mm HG in hypertensive participants. < 130/72mm HG in hypertensive participants with diabetes, heart failure or chronic kidney disease.;Long Term: Maintenance of blood pressure at goal levels.             Education:Diabetes - Individual verbal and written instruction to review signs/symptoms of diabetes, desired ranges of glucose level fasting, after meals and with exercise. Acknowledge that pre and post exercise glucose checks will be done for 3 sessions at entry of program.   Know Your Numbers and Heart Failure: - Group verbal and visual instruction to discuss disease risk factors for cardiac and pulmonary disease and treatment options.  Reviews associated critical values for Overweight/Obesity, Hypertension, Cholesterol, and Diabetes.  Discusses basics of heart failure:  signs/symptoms and treatments.  Introduces Heart Failure Zone chart for action plan for heart failure.  Written material given at graduation. Flowsheet Row Pulmonary Rehab from 10/18/2021 in [facility] Cardiac and Pulmonary Rehab  Date 10/11/21  Educator [facility]  Instruction Review Code 1- Verbalizes Understanding       Core Components/Risk Factors/Patient Goals Review:    Core Components/Risk Factors/Patient Goals at Discharge (Final Review):    ITP Comments:  ITP Comments     Row Name 02/12/24 1020 02/16/24 1011         ITP Comments Virtual Visit completed. Patient informed on EP and RD appointment and 6 Minute walk test. Patient also informed of patient health questionnaires on My Chart. Patient Verbalizes understanding. Visit diagnosis can be found in [facility] media. [facility]. Completed and gym orientation for pulmonary rehab. Initial ITP created and sent for review to Dr. Faud Aleskerov, Medical Director.               Comments: Initial ITP

## 2024-02-16 NOTE — Patient Instructions (Signed)
 Patient Instructions  Patient Details  Name: Isaac Harrison MRN: [id number] Date of Birth: 01-22-1956 Referring Provider:  Kaylee Partridge, DO  Below are your personal goals for exercise, nutrition, and risk factors. Our goal is to help you stay on track towards obtaining and maintaining these goals. We will be discussing your progress on these goals with you throughout the program.  Initial Exercise Prescription:  Initial Exercise Prescription - 02/16/24 1000       Date of Initial Exercise RX and Referring Provider   Date 02/16/24    Referring Provider Dr. Kaylee Partridge, MD      Oxygen   Maintain Oxygen Saturation 88% or higher      NuStep   Level 3    SPM 80    Minutes 15    METs 3.4      REL-XR   Level 2    Speed 50    Minutes 15    METs 3.4      Biostep-RELP   Level 2    SPM 50    Minutes 15    METs 3.4      Track   Laps 32    Minutes 15    METs 2.74      Prescription Details   Frequency (times per week) 2    Duration Progress to 30 minutes of continuous aerobic without signs/symptoms of physical distress      Intensity   THRR 40-80% of Max Heartrate 103-135    Ratings of Perceived Exertion 11-13    Perceived Dyspnea 0-4      Progression   Progression Continue to progress workloads to maintain intensity without signs/symptoms of physical distress.      Resistance Training   Training Prescription Yes    Weight 4 lb    Reps 10-15             Exercise Goals: Frequency: Be able to perform aerobic exercise two to three times per week in program working toward 2-5 days per week of home exercise.  Intensity: Work with a perceived exertion of 11 (fairly light) - 15 (hard) while following your exercise prescription.  We will make changes to your prescription with you as you progress through the program.   Duration: Be able to do 30 to 45 minutes of continuous aerobic exercise in addition to a 5 minute warm-up and a 5 minute cool-down routine.   Nutrition  Goals: Your personal nutrition goals will be established when you do your nutrition analysis with the dietician.  The following are general nutrition guidelines to follow: Cholesterol < 200mg /day Sodium < 1500mg /day Fiber: Men over 50 yrs - 30 grams per day  Personal Goals:  Personal Goals and Risk Factors at Admission - 02/12/24 1014       Core Components/Risk Factors/Patient Goals on Admission    Weight Management Yes;Weight Maintenance    Intervention Weight Management: Develop a combined nutrition and exercise program designed to reach desired caloric intake, while maintaining appropriate intake of nutrient and fiber, sodium and fats, and appropriate energy expenditure required for the weight goal.;Weight Management: Provide education and appropriate resources to help participant work on and attain dietary goals.;Weight Management/Obesity: Establish reasonable short term and long term weight goals.    Expected Outcomes Short Term: Continue to assess and modify interventions until short term weight is achieved;Weight Maintenance: Understanding of the daily nutrition guidelines, which includes 25-35% calories from fat, 7% or less cal from saturated fats, less than 200mg  cholesterol, less  than 1.5gm of sodium, & 5 or more servings of fruits and vegetables daily;Understanding recommendations for meals to include 15-35% energy as protein, 25-35% energy from fat, 35-60% energy from carbohydrates, less than 200mg  of dietary cholesterol, 20-35 gm of total fiber daily;Understanding of distribution of calorie intake throughout the day with the consumption of 4-5 meals/snacks    Improve shortness of breath with ADL's Yes    Intervention Provide education, individualized exercise plan and daily activity instruction to help decrease symptoms of SOB with activities of daily living.    Expected Outcomes Short Term: Improve cardiorespiratory fitness to achieve a reduction of symptoms when performing ADLs;Long  Term: Be able to perform more ADLs without symptoms or delay the onset of symptoms    Hypertension Yes    Intervention Provide education on lifestyle modifcations including regular physical activity/exercise, weight management, moderate sodium restriction and increased consumption of fresh fruit, vegetables, and low fat dairy, alcohol moderation, and smoking cessation.;Monitor prescription use compliance.    Expected Outcomes Short Term: Continued assessment and intervention until BP is < 140/57mm HG in hypertensive participants. < 130/9mm HG in hypertensive participants with diabetes, heart failure or chronic kidney disease.;Long Term: Maintenance of blood pressure at goal levels.             Exercise Goals and Review:  Exercise Goals     Row Name 02/16/24 1022             Exercise Goals   Increase Physical Activity Yes       Intervention Provide advice, education, support and counseling about physical activity/exercise needs.;Develop an individualized exercise prescription for aerobic and resistive training based on initial evaluation findings, risk stratification, comorbidities and participant's personal goals.       Expected Outcomes Short Term: Attend rehab on a regular basis to increase amount of physical activity.;Long Term: Add in home exercise to make exercise part of routine and to increase amount of physical activity.;Long Term: Exercising regularly at least 3-5 days a week.       Increase Strength and Stamina Yes       Intervention Provide advice, education, support and counseling about physical activity/exercise needs.;Develop an individualized exercise prescription for aerobic and resistive training based on initial evaluation findings, risk stratification, comorbidities and participant's personal goals.       Expected Outcomes Short Term: Increase workloads from initial exercise prescription for resistance, speed, and METs.;Short Term: Perform resistance training exercises  routinely during rehab and add in resistance training at home;Long Term: Improve cardiorespiratory fitness, muscular endurance and strength as measured by increased METs and functional capacity ( )       Able to understand and use rate of perceived exertion (RPE) scale Yes       Intervention Provide education and explanation on how to use RPE scale       Expected Outcomes Short Term: Able to use RPE daily in rehab to express subjective intensity level;Long Term:  Able to use RPE to guide intensity level when exercising independently       Able to understand and use Dyspnea scale Yes       Intervention Provide education and explanation on how to use Dyspnea scale       Expected Outcomes Short Term: Able to use Dyspnea scale daily in rehab to express subjective sense of shortness of breath during exertion;Long Term: Able to use Dyspnea scale to guide intensity level when exercising independently       Knowledge and understanding of Target  Heart Rate Range (THRR) Yes       Intervention Provide education and explanation of THRR including how the numbers were predicted and where they are located for reference       Expected Outcomes Long Term: Able to use THRR to govern intensity when exercising independently;Short Term: Able to state/look up THRR;Short Term: Able to use daily as guideline for intensity in rehab       Able to check pulse independently Yes       Intervention Provide education and demonstration on how to check pulse in carotid and radial arteries.;Review the importance of being able to check your own pulse for safety during independent exercise       Expected Outcomes Short Term: Able to explain why pulse checking is important during independent exercise;Long Term: Able to check pulse independently and accurately       Understanding of Exercise Prescription Yes       Intervention Provide education, explanation, and written materials on patient's individual exercise prescription        Expected Outcomes Short Term: Able to explain program exercise prescription;Long Term: Able to explain home exercise prescription to exercise independently

## 2024-02-19 ENCOUNTER — Encounter

## 2024-02-24 ENCOUNTER — Encounter

## 2024-02-26 ENCOUNTER — Encounter

## 2024-03-02 ENCOUNTER — Encounter

## 2024-03-04 ENCOUNTER — Encounter

## 2024-03-09 ENCOUNTER — Encounter: Attending: Pulmonary Disease

## 2024-03-09 DIAGNOSIS — J449 Chronic obstructive pulmonary disease, unspecified: Secondary | ICD-10-CM | POA: Insufficient documentation

## 2024-03-09 DIAGNOSIS — R0609 Other forms of dyspnea: Secondary | ICD-10-CM | POA: Insufficient documentation

## 2024-03-09 NOTE — Progress Notes (Signed)
 Daily Session Note  Patient Details  Name: Isaac Harrison MRN: [id number] Date of Birth: 09-14-1955 Referring Provider:   Flowsheet Row Pulmonary Rehab from 02/16/2024 in [facility] Cardiac and Pulmonary Rehab  Referring Provider Dr. Terrye Ruth, MD    Encounter Date: 03/09/2024  Check In:  Session Check In - 03/09/24 0734       Check-In   Supervising physician immediately available to respond to emergencies See telemetry face sheet for immediately available ER MD    Location [facility]-Cardiac & Pulmonary Rehab    Staff Present Burnard Davenport RN,BSN,MPA;Jason Elnor RDN,LDN;Margaret Best, MS, Exercise Physiologist    Virtual Visit No    Medication changes reported     No    Fall or balance concerns reported    No    Warm-up and Cool-down Performed on first and last piece of equipment    Resistance Training Performed Yes    VAD Patient? No    PAD/SET Patient? No      Pain Assessment   Currently in Pain? No/denies             Social History   Tobacco Use  Smoking Status Former   Current packs/day: 0.00   Average packs/day: 1.5 packs/day for 35.0 years (52.5 ttl pk-yrs)   Types: Cigarettes   Start date: 03/09/1986   Quit date: 03/09/2021   Years since quitting: 3.0  Smokeless Tobacco Never  Tobacco Comments   Quit   not smoking any at all    Goals Met:  Independence with exercise equipment Exercise tolerated well No report of concerns or symptoms today Strength training completed today  Goals Unmet:  Not Applicable  Comments: First full day of exercise!  Patient was oriented to gym and equipment including functions, settings, policies, and procedures.  Patient's individual exercise prescription and treatment plan were reviewed.  All starting workloads were established based on the results of the 6 minute walk test done at initial orientation visit.  The plan for exercise progression was also introduced and progression will be customized based on patient's performance and  goals.     Dr. Oneil Pinal is Medical Director for [facility] Cardiac Rehabilitation.  Dr. Fuad Aleskerov is Medical Director for [facility] Pulmonary Rehabilitation.

## 2024-03-10 ENCOUNTER — Encounter: Payer: Self-pay | Admitting: *Deleted

## 2024-03-10 DIAGNOSIS — J449 Chronic obstructive pulmonary disease, unspecified: Secondary | ICD-10-CM

## 2024-03-10 NOTE — Progress Notes (Signed)
 Pulmonary Individual Treatment Plan  Patient Details  Name: Isaac Harrison MRN: [id number] Date of Birth: 1956/02/05 Referring Provider:   Flowsheet Row Pulmonary Rehab from 02/16/2024 in [facility] Cardiac and Pulmonary Rehab  Referring Provider Dr. Terrye Ruth, MD    Initial Encounter Date:  Flowsheet Row Pulmonary Rehab from 02/16/2024 in [facility] Cardiac and Pulmonary Rehab  Date 02/16/24    Visit Diagnosis: Stage 3 severe COPD by GOLD classification (HCC)  Patient's Home Medications on Admission:  Current Outpatient Medications:    albuterol  (PROVENTIL  HFA;VENTOLIN  HFA) 108 (90 Base) MCG/ACT inhaler, Inhale 2 puffs into the lungs every 6 (six) hours as needed for wheezing or shortness of breath., Disp: 1 Inhaler, Rfl: 0   albuterol  (PROVENTIL ) (2.5 MG/3ML) 0.083% nebulizer solution, USE 1 AMPULE BY ORAL INHALATION FOUR TIMES A DAY AS NEEDED FOR BREATHING / SHORTNESS OF BREATH, Disp: , Rfl:    albuterol  (PROVENTIL ) (2.5 MG/3ML) 0.083% nebulizer solution, Inhale into the lungs., Disp: , Rfl:    albuterol  (VENTOLIN  HFA) 108 (90 Base) MCG/ACT inhaler, 1 puff as needed Inhalation every 4 hrs, Disp: , Rfl:    amLODipine (NORVASC) 10 MG tablet, Take by mouth. (Patient not taking: Reported on 02/12/2024), Disp: , Rfl:    amLODipine-benazepril (LOTREL) 5-10 MG capsule, Take by mouth., Disp: , Rfl:    aspirin  EC 81 MG tablet, Take 81 mg by mouth., Disp: , Rfl:    atorvastatin (LIPITOR) 40 MG tablet, Take 20 mg by mouth., Disp: , Rfl:    azelastine (ASTELIN) 0.1 % nasal spray, USE 1 TO 2 SPRAYS IN INTO EACH NOSTRIL TWO TIMES A DAY AS DIRECTED, Disp: , Rfl:    azithromycin  (ZITHROMAX  Z-PAK) 250 MG tablet, Take 2 tablets (500 mg) on  Day 1,  followed by 1 tablet (250 mg) once daily on Days 2 through 5. (Patient not taking: Reported on 04/07/2018), Disp: 6 each, Rfl: 0   Budeson-Glycopyrrol-Formoterol (BREZTRI AEROSPHERE) 160-9-4.8 MCG/ACT AERO, Inhale 2 Inhalers into the lungs 2 (two) times daily., Disp: , Rfl:     budesonide-glycopyrrolate-formoterol (BREZTRI AEROSPHERE) 160-9-4.8 MCG/ACT AERO inhaler, 2 puffs Inhalation Twice a day, Disp: , Rfl:    Calcium Carb-Cholecalciferol 600-10 MG-MCG TABS, Take 1 tablet by mouth 2 (two) times daily., Disp: , Rfl:    cetirizine (ZYRTEC) 10 MG tablet, Take 1 tablet by mouth daily., Disp: , Rfl:    doxycycline  (MONODOX ) 100 MG capsule, Take 100 mg by mouth., Disp: , Rfl:    Dupilumab 300 MG/2ML SOAJ, Inject into the skin. (Patient not taking: Reported on 02/12/2024), Disp: , Rfl:    fluticasone (FLONASE) 50 MCG/ACT nasal spray, INSTILL 2 SPRAYS INTO EACH NOSTRIL ONCE EVERY DAY MAXIMUM 2 SPRAYS IN EACH NOSTRIL DAILY. FOR ALLERGIES, Disp: , Rfl:    fluticasone (FLONASE) 50 MCG/ACT nasal spray, 1-2 sprays Nasally Once a day, Disp: , Rfl:    guaiFENesin (MUCINEX) 600 MG 12 hr tablet, Take 1 tablet by mouth 2 (two) times daily., Disp: , Rfl:    methylPREDNISolone (MEDROL DOSEPAK) 4 MG TBPK tablet, See admin instructions., Disp: , Rfl:    predniSONE  (DELTASONE ) 20 MG tablet, Take 3 tablets (60 mg total) by mouth daily., Disp: 12 tablet, Rfl: 0   predniSONE  (DELTASONE ) 20 MG tablet, Take 3 tablets (60 mg total) by mouth daily. (Patient not taking: Reported on 02/12/2024), Disp: 12 tablet, Rfl: 0   Spacer/Aero Chamber Mouthpiece MISC, 1 Units by Does not apply route every 4 (four) hours as needed (wheezing)., Disp: 1 each, Rfl: 0  tiotropium (SPIRIVA) 18 MCG inhalation capsule, Place 18 mcg into inhaler and inhale., Disp: , Rfl:   Past Medical History: Past Medical History:  Diagnosis Date   COPD (chronic obstructive pulmonary disease) (HCC)     Tobacco Use: Social History   Tobacco Use  Smoking Status Former   Current packs/day: 0.00   Average packs/day: 1.5 packs/day for 35.0 years (52.5 ttl pk-yrs)   Types: Cigarettes   Start date: 03/09/1986   Quit date: 03/09/2021   Years since quitting: 3.0  Smokeless Tobacco Never  Tobacco Comments   Quit   not smoking any at  all    Labs: Review Flowsheet       Latest Ref Rng & Units 04/07/2018  Labs for ITP Cardiac and Pulmonary Rehab  Bicarbonate 20.0 - 28.0 mmol/L 27.1   Acid-base deficit 0.0 - 2.0 mmol/L 0.6      Pulmonary Assessment Scores:  Pulmonary Assessment Scores     Row Name 02/16/24 1013         ADL UCSD   ADL Phase Entry     SOB Score total 59     Rest 0     Walk 2     Stairs 4     Bath 2     Dress 2     Shop 4       CAT Score   CAT Score 20       mMRC Score   mMRC Score 2        UCSD: Self-administered rating of dyspnea associated with activities of daily living (ADLs) 6-point scale (0 = not at all to 5 = maximal or unable to do because of breathlessness)  Scoring Scores range from 0 to 120.  Minimally important difference is 5 units  CAT: CAT can identify the health impairment of COPD patients and is better correlated with disease progression.  CAT has a scoring range of zero to 40. The CAT score is classified into four groups of low (less than 10), medium (10 - 20), high (21-30) and very high (31-40) based on the impact level of disease on health status. A CAT score over 10 suggests significant symptoms.  A worsening CAT score could be explained by an exacerbation, poor medication adherence, poor inhaler technique, or progression of COPD or comorbid conditions.  CAT MCID is 2 points  mMRC: mMRC (Modified Medical Research Council) Dyspnea Scale is used to assess the degree of baseline functional disability in patients of respiratory disease due to dyspnea. No minimal important difference is established. A decrease in score of 1 point or greater is considered a positive change.   Pulmonary Function Assessment:  Pulmonary Function Assessment - 02/12/24 1014       Post Bronchodilator Spirometry Results   FEV1% 41 %    FEV1/FVC Ratio 43.03      Breath   Shortness of Breath Yes;Limiting activity;Panic with Shortness of Breath          Exercise Target  Goals: Exercise Program Goal: Individual exercise prescription set using results from initial 6 min walk test and THRR while considering  patient's activity barriers and safety.   Exercise Prescription Goal: Initial exercise prescription builds to 30-45 minutes a day of aerobic activity, 2-3 days per week.  Home exercise guidelines will be given to patient during program as part of exercise prescription that the participant will acknowledge.  Education: Aerobic Exercise: - Group verbal and visual presentation on the components of exercise prescription. Introduces F.I.T.T principle  from ACSM for exercise prescriptions.  Reviews F.I.T.T. principles of aerobic exercise including progression. Written material given at graduation. Flowsheet Row Pulmonary Rehab from 10/18/2021 in [facility] Cardiac and Pulmonary Rehab  Date 07/05/21  Educator [facility]  Instruction Review Code 1- Verbalizes Understanding    Education: Resistance Exercise: - Group verbal and visual presentation on the components of exercise prescription. Introduces F.I.T.T principle from ACSM for exercise prescriptions  Reviews F.I.T.T. principles of resistance exercise including progression. Written material given at graduation.    Education: Exercise & Equipment Safety: - Individual verbal instruction and demonstration of equipment use and safety with use of the equipment. Flowsheet Row Pulmonary Rehab from 02/16/2024 in [facility] Cardiac and Pulmonary Rehab  Date 02/12/24  Educator [facility]  Instruction Review Code 1- Verbalizes Understanding    Education: Exercise Physiology & General Exercise Guidelines: - Group verbal and written instruction with models to review the exercise physiology of the cardiovascular system and associated critical values. Provides general exercise guidelines with specific guidelines to those with heart or lung disease.  Flowsheet Row Pulmonary Rehab from 10/18/2021 in [facility] Cardiac and Pulmonary Rehab  Date 06/28/21  Educator  [facility]  Instruction Review Code 1- Verbalizes Understanding    Education: Flexibility, Balance, Mind/Body Relaxation: - Group verbal and visual presentation with interactive activity on the components of exercise prescription. Introduces F.I.T.T principle from ACSM for exercise prescriptions. Reviews F.I.T.T. principles of flexibility and balance exercise training including progression. Also discusses the mind body connection.  Reviews various relaxation techniques to help reduce and manage stress (i.e. Deep breathing, progressive muscle relaxation, and visualization). Balance handout provided to take home. Written material given at graduation. Flowsheet Row Pulmonary Rehab from 10/18/2021 in [facility] Cardiac and Pulmonary Rehab  Date 07/19/21  Educator [facility]  Instruction Review Code 1- Verbalizes Understanding    Activity Barriers & Risk Stratification:  Activity Barriers & Cardiac Risk Stratification - 02/16/24 1023       Activity Barriers & Cardiac Risk Stratification   Activity Barriers Deconditioning;Shortness of Breath          6 Minute Walk:  6 Minute Walk     Row Name 02/16/24 1023         6 Minute Walk   Phase Initial     Distance 1145 feet     Walk Time 6 minutes     # of Rest Breaks 0     MPH 2.17     METS 3.4     RPE 15     Perceived Dyspnea  3     VO2 Peak 11.89     Symptoms No     Resting HR 72 bpm     Resting BP 112/66     Resting Oxygen Saturation  96 %     Exercise Oxygen Saturation  during 6 min walk 87 %     Max Ex. HR 103 bpm     Max Ex. BP 164/72     2 Minute Post BP 152/70       Interval HR   1 Minute HR 87     2 Minute HR 95     3 Minute HR 97     4 Minute HR 94     5 Minute HR 103     6 Minute HR 103     2 Minute Post HR 89     Interval Heart Rate? Yes       Interval Oxygen   Interval Oxygen? Yes  Baseline Oxygen Saturation % 96 %     1 Minute Oxygen Saturation % 94 %     1 Minute Liters of Oxygen 0 L  RA     2 Minute Oxygen Saturation %  92 %     2 Minute Liters of Oxygen 0 L     3 Minute Oxygen Saturation % 89 %     3 Minute Liters of Oxygen 0 L     4 Minute Oxygen Saturation % 91 %     4 Minute Liters of Oxygen 0 L     5 Minute Oxygen Saturation % 87 %     5 Minute Liters of Oxygen 0 L     6 Minute Oxygen Saturation % 88 %     6 Minute Liters of Oxygen 0 L     2 Minute Post Oxygen Saturation % 96 %     2 Minute Post Liters of Oxygen 0 L       Oxygen Initial Assessment:  Oxygen Initial Assessment - 02/12/24 1014       Home Oxygen   Home Oxygen Device None    Sleep Oxygen Prescription None    Home Exercise Oxygen Prescription None    Home Resting Oxygen Prescription None      Initial 6 min Walk   Oxygen Used None      Program Oxygen Prescription   Program Oxygen Prescription None      Intervention   Short Term Goals To learn and understand importance of maintaining oxygen saturations>88%;To learn and exhibit compliance with exercise, home and travel O2 prescription;To learn and demonstrate proper use of respiratory medications;To learn and demonstrate proper pursed lip breathing techniques or other breathing techniques. ;To learn and understand importance of monitoring SPO2 with pulse oximeter and demonstrate accurate use of the pulse oximeter.    Long  Term Goals Verbalizes importance of monitoring SPO2 with pulse oximeter and return demonstration;Exhibits proper breathing techniques, such as pursed lip breathing or other method taught during program session;Demonstrates proper use of MDI's;Compliance with respiratory medication;Maintenance of O2 saturations>88%;Exhibits compliance with exercise, home  and travel O2 prescription          Oxygen Re-Evaluation:  Oxygen Re-Evaluation     Row Name 03/09/24 0737             Program Oxygen Prescription   Program Oxygen Prescription None         Home Oxygen   Home Oxygen Device None       Sleep Oxygen Prescription None       Home Exercise Oxygen  Prescription None       Home Resting Oxygen Prescription None       Compliance with Home Oxygen Use Yes         Goals/Expected Outcomes   Short Term Goals To learn and understand importance of maintaining oxygen saturations>88%;To learn and exhibit compliance with exercise, home and travel O2 prescription;To learn and demonstrate proper use of respiratory medications;To learn and demonstrate proper pursed lip breathing techniques or other breathing techniques. ;To learn and understand importance of monitoring SPO2 with pulse oximeter and demonstrate accurate use of the pulse oximeter.       Long  Term Goals Verbalizes importance of monitoring SPO2 with pulse oximeter and return demonstration;Exhibits proper breathing techniques, such as pursed lip breathing or other method taught during program session;Demonstrates proper use of MDI's;Compliance with respiratory medication;Maintenance of O2 saturations>88%;Exhibits compliance with exercise, home  and travel O2  prescription       Comments Reviewed PLB technique with pt.  Talked about how it works and it's importance in maintaining their exercise saturations.       Goals/Expected Outcomes Short: Become more profiecient at using PLB. Long: Become independent at using PLB.          Oxygen Discharge (Final Oxygen Re-Evaluation):  Oxygen Re-Evaluation - 03/09/24 0737       Program Oxygen Prescription   Program Oxygen Prescription None      Home Oxygen   Home Oxygen Device None    Sleep Oxygen Prescription None    Home Exercise Oxygen Prescription None    Home Resting Oxygen Prescription None    Compliance with Home Oxygen Use Yes      Goals/Expected Outcomes   Short Term Goals To learn and understand importance of maintaining oxygen saturations>88%;To learn and exhibit compliance with exercise, home and travel O2 prescription;To learn and demonstrate proper use of respiratory medications;To learn and demonstrate proper pursed lip breathing  techniques or other breathing techniques. ;To learn and understand importance of monitoring SPO2 with pulse oximeter and demonstrate accurate use of the pulse oximeter.    Long  Term Goals Verbalizes importance of monitoring SPO2 with pulse oximeter and return demonstration;Exhibits proper breathing techniques, such as pursed lip breathing or other method taught during program session;Demonstrates proper use of MDI's;Compliance with respiratory medication;Maintenance of O2 saturations>88%;Exhibits compliance with exercise, home  and travel O2 prescription    Comments Reviewed PLB technique with pt.  Talked about how it works and it's importance in maintaining their exercise saturations.    Goals/Expected Outcomes Short: Become more profiecient at using PLB. Long: Become independent at using PLB.          Initial Exercise Prescription:  Initial Exercise Prescription - 02/16/24 1000       Date of Initial Exercise RX and Referring Provider   Date 02/16/24    Referring Provider Dr. Terrye Ruth, MD      Oxygen   Maintain Oxygen Saturation 88% or higher      NuStep   Level 3    SPM 80    Minutes 15    METs 3.4      REL-XR   Level 2    Speed 50    Minutes 15    METs 3.4      Biostep-RELP   Level 2    SPM 50    Minutes 15    METs 3.4      Track   Laps 32    Minutes 15    METs 2.74      Prescription Details   Frequency (times per week) 2    Duration Progress to 30 minutes of continuous aerobic without signs/symptoms of physical distress      Intensity   THRR 40-80% of Max Heartrate 103-135    Ratings of Perceived Exertion 11-13    Perceived Dyspnea 0-4      Progression   Progression Continue to progress workloads to maintain intensity without signs/symptoms of physical distress.      Resistance Training   Training Prescription Yes    Weight 4 lb    Reps 10-15          Perform Capillary Blood Glucose checks as needed.  Exercise Prescription Changes:    Exercise Prescription Changes     Row Name 02/16/24 1000             Response to Exercise  Blood Pressure (Admit) 112/66       Blood Pressure (Exercise) 164/72       Blood Pressure (Exit) 152/70       Heart Rate (Admit) 72 bpm       Heart Rate (Exercise) 103 bpm       Heart Rate (Exit) 89 bpm       Oxygen Saturation (Admit) 96 %       Oxygen Saturation (Exercise) 87 %       Oxygen Saturation (Exit) 96 %       Rating of Perceived Exertion (Exercise) 15       Perceived Dyspnea (Exercise) 3       Symptoms none       Comments Results          Exercise Comments:   Exercise Comments     Row Name 03/09/24 0736           Exercise Comments First full day of exercise!  Patient was oriented to gym and equipment including functions, settings, policies, and procedures.  Patient's individual exercise prescription and treatment plan were reviewed.  All starting workloads were established based on the results of the 6 minute walk test done at initial orientation visit.  The plan for exercise progression was also introduced and progression will be customized based on patient's performance and goals.          Exercise Goals and Review:   Exercise Goals     Row Name 02/16/24 1022             Exercise Goals   Increase Physical Activity Yes       Intervention Provide advice, education, support and counseling about physical activity/exercise needs.;Develop an individualized exercise prescription for aerobic and resistive training based on initial evaluation findings, risk stratification, comorbidities and participant's personal goals.       Expected Outcomes Short Term: Attend rehab on a regular basis to increase amount of physical activity.;Long Term: Add in home exercise to make exercise part of routine and to increase amount of physical activity.;Long Term: Exercising regularly at least 3-5 days a week.       Increase Strength and Stamina Yes       Intervention Provide advice,  education, support and counseling about physical activity/exercise needs.;Develop an individualized exercise prescription for aerobic and resistive training based on initial evaluation findings, risk stratification, comorbidities and participant's personal goals.       Expected Outcomes Short Term: Increase workloads from initial exercise prescription for resistance, speed, and METs.;Short Term: Perform resistance training exercises routinely during rehab and add in resistance training at home;Long Term: Improve cardiorespiratory fitness, muscular endurance and strength as measured by increased METs and functional capacity ( )       Able to understand and use rate of perceived exertion (RPE) scale Yes       Intervention Provide education and explanation on how to use RPE scale       Expected Outcomes Short Term: Able to use RPE daily in rehab to express subjective intensity level;Long Term:  Able to use RPE to guide intensity level when exercising independently       Able to understand and use Dyspnea scale Yes       Intervention Provide education and explanation on how to use Dyspnea scale       Expected Outcomes Short Term: Able to use Dyspnea scale daily in rehab to express subjective sense of shortness of breath during exertion;Long Term: Able  to use Dyspnea scale to guide intensity level when exercising independently       Knowledge and understanding of Target Heart Rate Range (THRR) Yes       Intervention Provide education and explanation of THRR including how the numbers were predicted and where they are located for reference       Expected Outcomes Long Term: Able to use THRR to govern intensity when exercising independently;Short Term: Able to state/look up THRR;Short Term: Able to use daily as guideline for intensity in rehab       Able to check pulse independently Yes       Intervention Provide education and demonstration on how to check pulse in carotid and radial arteries.;Review the  importance of being able to check your own pulse for safety during independent exercise       Expected Outcomes Short Term: Able to explain why pulse checking is important during independent exercise;Long Term: Able to check pulse independently and accurately       Understanding of Exercise Prescription Yes       Intervention Provide education, explanation, and written materials on patient's individual exercise prescription       Expected Outcomes Short Term: Able to explain program exercise prescription;Long Term: Able to explain home exercise prescription to exercise independently          Exercise Goals Re-Evaluation :  Exercise Goals Re-Evaluation     Row Name 03/09/24 0736             Exercise Goal Re-Evaluation   Exercise Goals Review Increase Physical Activity;Able to understand and use rate of perceived exertion (RPE) scale;Knowledge and understanding of Target Heart Rate Range (THRR);Understanding of Exercise Prescription;Increase Strength and Stamina;Able to understand and use Dyspnea scale;Able to check pulse independently       Comments Reviewed RPE and dyspnea scale, THR and program prescription with pt today.  Pt voiced understanding and was given a copy of goals to take home.       Expected Outcomes Short: Use RPE daily to regulate intensity. Long: Follow program prescription in THR.          Discharge Exercise Prescription (Final Exercise Prescription Changes):  Exercise Prescription Changes - 02/16/24 1000       Response to Exercise   Blood Pressure (Admit) 112/66    Blood Pressure (Exercise) 164/72    Blood Pressure (Exit) 152/70    Heart Rate (Admit) 72 bpm    Heart Rate (Exercise) 103 bpm    Heart Rate (Exit) 89 bpm    Oxygen Saturation (Admit) 96 %    Oxygen Saturation (Exercise) 87 %    Oxygen Saturation (Exit) 96 %    Rating of Perceived Exertion (Exercise) 15    Perceived Dyspnea (Exercise) 3    Symptoms none    Comments Results           Nutrition:  Target Goals: Understanding of nutrition guidelines, daily intake of sodium 1500mg , cholesterol 200mg , calories 30% from fat and 7% or less from saturated fats, daily to have 5 or more servings of fruits and vegetables.  Education: All About Nutrition: -Group instruction provided by verbal, written material, interactive activities, discussions, models, and posters to present general guidelines for heart healthy nutrition including fat, fiber, MyPlate, the role of sodium in heart healthy nutrition, utilization of the nutrition label, and utilization of this knowledge for meal planning. Follow up email sent as well. Written material given at graduation. Flowsheet Row Pulmonary Rehab from  10/18/2021 in [facility] Cardiac and Pulmonary Rehab  Date 05/31/21  Educator [facility]  Instruction Review Code 1- Verbalizes Understanding    Biometrics:  Pre Biometrics - 02/16/24 1021       Pre Biometrics   Height 5' 11.25 (1.81 m)    Weight 158 lb 4.8 oz (71.8 kg)    Waist Circumference 36 inches    Hip Circumference 35 inches    Waist to Hip Ratio 1.03 %    BMI (Calculated) 21.92    Single Leg Stand 30 seconds           Nutrition Therapy Plan and Nutrition Goals:  Nutrition Therapy & Goals - 02/16/24 1017       Nutrition Therapy   RD appointment deferred Yes      Intervention Plan   Intervention Prescribe, educate and counsel regarding individualized specific dietary modifications aiming towards targeted core components such as weight, hypertension, lipid management, diabetes, heart failure and other comorbidities.    Expected Outcomes Short Term Goal: Understand basic principles of dietary content, such as calories, fat, sodium, cholesterol and nutrients.;Short Term Goal: A plan has been developed with personal nutrition goals set during dietitian appointment.;Long Term Goal: Adherence to prescribed nutrition plan.          Nutrition Assessments:  MEDIFICTS Score Key: >=70  Need to make dietary changes  40-70 Heart Healthy Diet <= 40 Therapeutic Level Cholesterol Diet  Flowsheet Row Pulmonary Rehab from 02/16/2024 in [facility] Cardiac and Pulmonary Rehab  Picture Your Plate Total Score on Admission 55   Picture Your Plate Scores: <59 Unhealthy dietary pattern with much room for improvement. 41-50 Dietary pattern unlikely to meet recommendations for good health and room for improvement. 51-60 More healthful dietary pattern, with some room for improvement.  >60 Healthy dietary pattern, although there may be some specific behaviors that could be improved.   Nutrition Goals Re-Evaluation:   Nutrition Goals Discharge (Final Nutrition Goals Re-Evaluation):   Psychosocial: Target Goals: Acknowledge presence or absence of significant depression and/or stress, maximize coping skills, provide positive support system. Participant is able to verbalize types and ability to use techniques and skills needed for reducing stress and depression.   Education: Stress, Anxiety, and Depression - Group verbal and visual presentation to define topics covered.  Reviews how body is impacted by stress, anxiety, and depression.  Also discusses healthy ways to reduce stress and to treat/manage anxiety and depression.  Written material given at graduation. Flowsheet Row Pulmonary Rehab from 10/18/2021 in [facility] Cardiac and Pulmonary Rehab  Date 06/21/21  Educator [facility]  Instruction Review Code 1- [employer] Understanding    Education: Sleep Hygiene -Provides group verbal and written instruction about how sleep can affect your health.  Define sleep hygiene, discuss sleep cycles and impact of sleep habits. Review good sleep hygiene tips.    Initial Review & Psychosocial Screening:  Initial Psych Review & Screening - 02/12/24 1015       Initial Review   Current issues with None Identified      Family Dynamics   Good Support System? Yes    Comments Garrel can look to his wife, daughter and  friends for support.      Barriers   Psychosocial barriers to participate in program The patient should benefit from training in stress management and relaxation.;There are no identifiable barriers or psychosocial needs.      Screening Interventions   Interventions Provide feedback about the scores to participant;To provide support and resources with identified psychosocial  needs;Encouraged to exercise    Expected Outcomes Short Term goal: Utilizing psychosocial counselor, staff and physician to assist with identification of specific Stressors or current issues interfering with healing process. Setting desired goal for each stressor or current issue identified.;Long Term Goal: Stressors or current issues are controlled or eliminated.;Short Term goal: Identification and review with participant of any Quality of Life or Depression concerns found by scoring the questionnaire.;Long Term goal: The participant improves quality of Life and PHQ9 Scores as seen by post scores and/or verbalization of changes          Quality of Life Scores:  Scores of 19 and below usually indicate a poorer quality of life in these areas.  A difference of  2-3 points is a clinically meaningful difference.  A difference of 2-3 points in the total score of the Quality of Life Index has been associated with significant improvement in overall quality of life, self-image, physical symptoms, and general health in studies assessing change in quality of life.  PHQ-9: Review Flowsheet       02/16/2024 10/18/2021 06/21/2021 05/24/2021  Depression screen PHQ 2/9  Decreased Interest 1 0 1 1  Down, Depressed, Hopeless 0 0 0 0  PHQ - 2 Score 1 0 1 1  Altered sleeping 0 0 0 1  Tired, decreased energy 2 0 1 2  Change in appetite 0 0 0 0  Feeling bad or failure about yourself  0 0 0 0  Trouble concentrating 0 0 0 0  Moving slowly or fidgety/restless 1 0 1 1  Suicidal thoughts 0 0 0 0  PHQ-9 Score 4 0 3 5  Difficult doing  work/chores Not difficult at all Somewhat difficult Somewhat difficult Not difficult at all   Interpretation of Total Score  Total Score Depression Severity:  1-4 = Minimal depression, 5-9 = Mild depression, 10-14 = Moderate depression, 15-19 = Moderately severe depression, 20-27 = Severe depression   Psychosocial Evaluation and Intervention:  Psychosocial Evaluation - 02/12/24 1016       Psychosocial Evaluation & Interventions   Interventions Encouraged to exercise with the program and follow exercise prescription;Relaxation education;Stress management education    Comments Garrel can look to his wife, daughter and friends for support.    Expected Outcomes Short: Start [facility] to help with mood. Long: Maintain a healthy mental state    Continue Psychosocial Services  Follow up required by staff          Psychosocial Re-Evaluation:   Psychosocial Discharge (Final Psychosocial Re-Evaluation):   Education: Education Goals: Education classes will be provided on a weekly basis, covering required topics. Participant will state understanding/return demonstration of topics presented.  Learning Barriers/Preferences:  Learning Barriers/Preferences - 02/12/24 1013       Learning Barriers/Preferences   Learning Barriers Hearing    Learning Preferences None          General Pulmonary Education Topics:  Infection Prevention: - Provides verbal and written material to individual with discussion of infection control including proper hand washing and proper equipment cleaning during exercise session. Flowsheet Row Pulmonary Rehab from 02/16/2024 in [facility] Cardiac and Pulmonary Rehab  Date 02/12/24  Educator [facility]  Instruction Review Code 1- Verbalizes Understanding    Falls Prevention: - Provides verbal and written material to individual with discussion of falls prevention and safety. Flowsheet Row Pulmonary Rehab from 02/16/2024 in [facility] Cardiac and Pulmonary Rehab  Date 02/12/24   Educator [facility]  Instruction Review Code 1- Verbalizes Understanding    Chronic  Lung Disease Review: - Group verbal instruction with posters, models, PowerPoint presentations and videos,  to review new updates, new respiratory medications, new advancements in procedures and treatments. Providing information on websites and 800 numbers for continued self-education. Includes information about supplement oxygen, available portable oxygen systems, continuous and intermittent flow rates, oxygen safety, concentrators, and Medicare reimbursement for oxygen. Explanation of Pulmonary Drugs, including class, frequency, complications, importance of spacers, rinsing mouth after steroid MDI's, and proper cleaning methods for nebulizers. Review of basic lung anatomy and physiology related to function, structure, and complications of lung disease. Review of risk factors. Discussion about methods for diagnosing sleep apnea and types of masks and machines for OSA. Includes a review of the use of types of environmental controls: home humidity, furnaces, filters, dust mite/pet prevention, HEPA vacuums. Discussion about weather changes, air quality and the benefits of nasal washing. Instruction on Warning signs, infection symptoms, calling MD promptly, preventive modes, and value of vaccinations. Review of effective airway clearance, coughing and/or vibration techniques. Emphasizing that all should Create an Action Plan. Written material given at graduation. Flowsheet Row Pulmonary Rehab from 02/16/2024 in [facility] Cardiac and Pulmonary Rehab  Education need identified 02/16/24    AED/CPR: - Group verbal and written instruction with the use of models to demonstrate the basic use of the AED with the basic ABC's of resuscitation.    Anatomy and Cardiac Procedures: - Group verbal and visual presentation and models provide information about basic cardiac anatomy and function. Reviews the testing methods done to diagnose heart  disease and the outcomes of the test results. Describes the treatment choices: Medical Management, Angioplasty, or Coronary Bypass Surgery for treating various heart conditions including Myocardial Infarction, Angina, Valve Disease, and Cardiac Arrhythmias.  Written material given at graduation.   Medication Safety: - Group verbal and visual instruction to review commonly prescribed medications for heart and lung disease. Reviews the medication, class of the drug, and side effects. Includes the steps to properly store meds and maintain the prescription regimen.  Written material given at graduation. Flowsheet Row Pulmonary Rehab from 10/18/2021 in [facility] Cardiac and Pulmonary Rehab  Date 09/27/21  Educator [facility]  Instruction Review Code 1- Verbalizes Understanding    Other: -Provides group and verbal instruction on various topics (see comments)   Knowledge Questionnaire Score:  Knowledge Questionnaire Score - 02/16/24 1016       Knowledge Questionnaire Score   Pre Score 17/18           Core Components/Risk Factors/Patient Goals at Admission:  Personal Goals and Risk Factors at Admission - 02/12/24 1014       Core Components/Risk Factors/Patient Goals on Admission    Weight Management Yes;Weight Maintenance    Intervention Weight Management: Develop a combined nutrition and exercise program designed to reach desired caloric intake, while maintaining appropriate intake of nutrient and fiber, sodium and fats, and appropriate energy expenditure required for the weight goal.;Weight Management: Provide education and appropriate resources to help participant work on and attain dietary goals.;Weight Management/Obesity: Establish reasonable short term and long term weight goals.    Expected Outcomes Short Term: Continue to assess and modify interventions until short term weight is achieved;Weight Maintenance: Understanding of the daily nutrition guidelines, which includes 25-35% calories from fat,  7% or less cal from saturated fats, less than 200mg  cholesterol, less than 1.5gm of sodium, & 5 or more servings of fruits and vegetables daily;Understanding recommendations for meals to include 15-35% energy as protein, 25-35% energy from fat, 35-60% energy  from carbohydrates, less than 200mg  of dietary cholesterol, 20-35 gm of total fiber daily;Understanding of distribution of calorie intake throughout the day with the consumption of 4-5 meals/snacks    Improve shortness of breath with ADL's Yes    Intervention Provide education, individualized exercise plan and daily activity instruction to help decrease symptoms of SOB with activities of daily living.    Expected Outcomes Short Term: Improve cardiorespiratory fitness to achieve a reduction of symptoms when performing ADLs;Long Term: Be able to perform more ADLs without symptoms or delay the onset of symptoms    Hypertension Yes    Intervention Provide education on lifestyle modifcations including regular physical activity/exercise, weight management, moderate sodium restriction and increased consumption of fresh fruit, vegetables, and low fat dairy, alcohol moderation, and smoking cessation.;Monitor prescription use compliance.    Expected Outcomes Short Term: Continued assessment and intervention until BP is < 140/46mm HG in hypertensive participants. < 130/22mm HG in hypertensive participants with diabetes, heart failure or chronic kidney disease.;Long Term: Maintenance of blood pressure at goal levels.          Education:Diabetes - Individual verbal and written instruction to review signs/symptoms of diabetes, desired ranges of glucose level fasting, after meals and with exercise. Acknowledge that pre and post exercise glucose checks will be done for 3 sessions at entry of program.   Know Your Numbers and Heart Failure: - Group verbal and visual instruction to discuss disease risk factors for cardiac and pulmonary disease and treatment  options.  Reviews associated critical values for Overweight/Obesity, Hypertension, Cholesterol, and Diabetes.  Discusses basics of heart failure: signs/symptoms and treatments.  Introduces Heart Failure Zone chart for action plan for heart failure.  Written material given at graduation. Flowsheet Row Pulmonary Rehab from 10/18/2021 in [facility] Cardiac and Pulmonary Rehab  Date 10/11/21  Educator [facility]  Instruction Review Code 1- Verbalizes Understanding    Core Components/Risk Factors/Patient Goals Review:    Core Components/Risk Factors/Patient Goals at Discharge (Final Review):    ITP Comments:  ITP Comments     Row Name 02/12/24 1020 02/16/24 1011 03/09/24 0736 03/10/24 1046     ITP Comments Virtual Visit completed. Patient informed on EP and RD appointment and 6 Minute walk test. Patient also informed of patient health questionnaires on My Chart. Patient Verbalizes understanding. Visit diagnosis can be found in [facility] media. [facility]. Completed and gym orientation for pulmonary rehab. Initial ITP created and sent for review to Dr. Faud Aleskerov, Medical Director. First full day of exercise!  Patient was oriented to gym and equipment including functions, settings, policies, and procedures.  Patient's individual exercise prescription and treatment plan were reviewed.  All starting workloads were established based on the results of the 6 minute walk test done at initial orientation visit.  The plan for exercise progression was also introduced and progression will be customized based on patient's performance and goals. 30 Day review completed. Medical Director ITP review done, changes made as directed, and signed approval by Medical Director.       Comments: 30 day review

## 2024-03-11 ENCOUNTER — Encounter: Admitting: *Deleted

## 2024-03-11 DIAGNOSIS — J449 Chronic obstructive pulmonary disease, unspecified: Secondary | ICD-10-CM

## 2024-03-11 NOTE — Progress Notes (Signed)
 Daily Session Note  Patient Details  Name: EGOR FULLILOVE MRN: [id number] Date of Birth: 1956/09/01 Referring Provider:   Flowsheet Row Pulmonary Rehab from 02/16/2024 in [facility] Cardiac and Pulmonary Rehab  Referring Provider Dr. Terrye Ruth, MD    Encounter Date: 03/11/2024  Check In:  Session Check In - 03/11/24 0739       Check-In   Supervising physician immediately available to respond to emergencies See telemetry face sheet for immediately available ER MD    Location [facility]-Cardiac & Pulmonary Rehab    Staff Present Devaughn Jaeger, BS, Exercise Physiologist;Horton Ellithorpe, RN, BSN, CCRP;Joseph Hood RCP,RRT,BSRT;Jason Elnor RDN,LDN    Virtual Visit No    Medication changes reported     No    Fall or balance concerns reported    No    Warm-up and Cool-down Performed on first and last piece of equipment    Resistance Training Performed Yes    VAD Patient? No    PAD/SET Patient? No      Pain Assessment   Currently in Pain? No/denies             Social History   Tobacco Use  Smoking Status Former   Current packs/day: 0.00   Average packs/day: 1.5 packs/day for 35.0 years (52.5 ttl pk-yrs)   Types: Cigarettes   Start date: 03/09/1986   Quit date: 03/09/2021   Years since quitting: 3.0  Smokeless Tobacco Never  Tobacco Comments   Quit   not smoking any at all    Goals Met:  Proper associated with RPD/PD & O2 Sat Independence with exercise equipment Exercise tolerated well No report of concerns or symptoms today  Goals Unmet:  Not Applicable  Comments: Pt able to follow exercise prescription today without complaint.  Will continue to monitor for progression.    Dr. Oneil Pinal is Medical Director for [facility] Cardiac Rehabilitation.  Dr. Fuad Aleskerov is Medical Director for [facility] Pulmonary Rehabilitation.

## 2024-03-16 ENCOUNTER — Encounter

## 2024-03-16 DIAGNOSIS — J449 Chronic obstructive pulmonary disease, unspecified: Secondary | ICD-10-CM | POA: Diagnosis not present

## 2024-03-16 NOTE — Progress Notes (Signed)
 Daily Session Note  Patient Details  Name: Isaac Harrison MRN: [id number] Date of Birth: 03-Oct-1955 Referring Provider:   Flowsheet Row Pulmonary Rehab from 02/16/2024 in [facility] Cardiac and Pulmonary Rehab  Referring Provider Dr. Terrye Ruth, MD    Encounter Date: 03/16/2024  Check In:  Session Check In - 03/16/24 0714       Check-In   Supervising physician immediately available to respond to emergencies See telemetry face sheet for immediately available ER MD    Location [facility]-Cardiac & Pulmonary Rehab    Staff Present Burnard Davenport [facility] Dyane BS, ACSM CEP, Exercise Physiologist;Margaret Best, MS, Exercise Physiologist;Jason Elnor RDN,LDN    Virtual Visit No    Medication changes reported     No    Fall or balance concerns reported    No    Warm-up and Cool-down Performed on first and last piece of equipment    Resistance Training Performed Yes    VAD Patient? No    PAD/SET Patient? No      Pain Assessment   Currently in Pain? No/denies             Social History   Tobacco Use  Smoking Status Former   Current packs/day: 0.00   Average packs/day: 1.5 packs/day for 35.0 years (52.5 ttl pk-yrs)   Types: Cigarettes   Start date: 03/09/1986   Quit date: 03/09/2021   Years since quitting: 3.0  Smokeless Tobacco Never  Tobacco Comments   Quit   not smoking any at all    Goals Met:  Independence with exercise equipment Exercise tolerated well No report of concerns or symptoms today Strength training completed today  Goals Unmet:  Not Applicable  Comments: Pt able to follow exercise prescription today without complaint.  Will continue to monitor for progression.    Dr. Oneil Pinal is Medical Director for [facility] Cardiac Rehabilitation.  Dr. Fuad Aleskerov is Medical Director for [facility] Pulmonary Rehabilitation.

## 2024-03-18 ENCOUNTER — Encounter: Admitting: *Deleted

## 2024-03-18 DIAGNOSIS — J449 Chronic obstructive pulmonary disease, unspecified: Secondary | ICD-10-CM | POA: Diagnosis not present

## 2024-03-18 NOTE — Progress Notes (Signed)
 Daily Session Note  Patient Details  Name: Isaac Harrison MRN: [id number] Date of Birth: 1956-05-23 Referring Provider:   Flowsheet Row Pulmonary Rehab from 02/16/2024 in [facility] Cardiac and Pulmonary Rehab  Referring Provider Dr. Terrye Ruth, MD    Encounter Date: 03/18/2024  Check In:  Session Check In - 03/18/24 0749       Check-In   Supervising physician immediately available to respond to emergencies See telemetry face sheet for immediately available ER MD    Location [facility]-Cardiac & Pulmonary Rehab    Staff Present Fairy Plater RCP,RRT,BSRT;Laureen Delores, BS, RRT, CPFT;Ronan Dion, RN, BSN, CCRP    Virtual Visit No    Medication changes reported     No    Fall or balance concerns reported    No    Warm-up and Cool-down Performed on first and last piece of equipment    Resistance Training Performed Yes    VAD Patient? No    PAD/SET Patient? No      Pain Assessment   Currently in Pain? No/denies             Social History   Tobacco Use  Smoking Status Former   Current packs/day: 0.00   Average packs/day: 1.5 packs/day for 35.0 years (52.5 ttl pk-yrs)   Types: Cigarettes   Start date: 03/09/1986   Quit date: 03/09/2021   Years since quitting: 3.0  Smokeless Tobacco Never  Tobacco Comments   Quit   not smoking any at all    Goals Met:  Proper associated with RPD/PD & O2 Sat Independence with exercise equipment Exercise tolerated well No report of concerns or symptoms today  Goals Unmet:  Not Applicable  Comments: Pt able to follow exercise prescription today without complaint.  Will continue to monitor for progression.    Dr. Oneil Pinal is Medical Director for [facility] Cardiac Rehabilitation.  Dr. Fuad Aleskerov is Medical Director for [facility] Pulmonary Rehabilitation.

## 2024-03-23 ENCOUNTER — Encounter

## 2024-03-23 DIAGNOSIS — J449 Chronic obstructive pulmonary disease, unspecified: Secondary | ICD-10-CM

## 2024-03-23 NOTE — Progress Notes (Signed)
 Daily Session Note  Patient Details  Name: MILIANO COTTEN MRN: [id number] Date of Birth: 1956-08-27 Referring Provider:   Flowsheet Row Pulmonary Rehab from 02/16/2024 in [facility] Cardiac and Pulmonary Rehab  Referring Provider Dr. Terrye Ruth, MD    Encounter Date: 03/23/2024  Check In:  Session Check In - 03/23/24 0719       Check-In   Supervising physician immediately available to respond to emergencies See telemetry face sheet for immediately available ER MD    Location [facility]-Cardiac & Pulmonary Rehab    Staff Present Burnard Davenport RN,BSN,MPA;Margaret Best, MS, Exercise Physiologist;Jason Elnor RDN,LDN;Laureen Delores, BS, RRT, CPFT    Virtual Visit No    Medication changes reported     No    Fall or balance concerns reported    No    Warm-up and Cool-down Performed on first and last piece of equipment    Resistance Training Performed Yes    VAD Patient? No    PAD/SET Patient? No      Pain Assessment   Currently in Pain? No/denies             Social History   Tobacco Use  Smoking Status Former   Current packs/day: 0.00   Average packs/day: 1.5 packs/day for 35.0 years (52.5 ttl pk-yrs)   Types: Cigarettes   Start date: 03/09/1986   Quit date: 03/09/2021   Years since quitting: 3.0  Smokeless Tobacco Never  Tobacco Comments   Quit   not smoking any at all    Goals Met:  Independence with exercise equipment Exercise tolerated well No report of concerns or symptoms today Strength training completed today  Goals Unmet:  Not Applicable  Comments: Pt able to follow exercise prescription today without complaint.  Will continue to monitor for progression.    Dr. Oneil Pinal is Medical Director for [facility] Cardiac Rehabilitation.  Dr. Fuad Aleskerov is Medical Director for [facility] Pulmonary Rehabilitation.

## 2024-03-25 ENCOUNTER — Encounter: Admitting: *Deleted

## 2024-03-25 DIAGNOSIS — J449 Chronic obstructive pulmonary disease, unspecified: Secondary | ICD-10-CM

## 2024-03-25 NOTE — Progress Notes (Signed)
 Daily Session Note  Patient Details  Name: Isaac Harrison MRN: [id number] Date of Birth: March 07, 1956 Referring Provider:   Flowsheet Row Pulmonary Rehab from 02/16/2024 in [facility] Cardiac and Pulmonary Rehab  Referring Provider Dr. Terrye Ruth, MD    Encounter Date: 03/25/2024  Check In:  Session Check In - 03/25/24 0746       Check-In   Supervising physician immediately available to respond to emergencies See telemetry face sheet for immediately available ER MD    Location [facility]-Cardiac & Pulmonary Rehab    Staff Present Othel Durand, RN, BSN, CCRP;Jason Elnor RDN,LDN;Joseph Rolinda RCP,RRT,BSRT    Virtual Visit No    Medication changes reported     No    Fall or balance concerns reported    No    Warm-up and Cool-down Performed on first and last piece of equipment    Resistance Training Performed Yes    VAD Patient? No    PAD/SET Patient? No      Pain Assessment   Currently in Pain? No/denies             Social History   Tobacco Use  Smoking Status Former   Current packs/day: 0.00   Average packs/day: 1.5 packs/day for 35.0 years (52.5 ttl pk-yrs)   Types: Cigarettes   Start date: 03/09/1986   Quit date: 03/09/2021   Years since quitting: 3.0  Smokeless Tobacco Never  Tobacco Comments   Quit   not smoking any at all    Goals Met:  Proper associated with RPD/PD & O2 Sat Independence with exercise equipment Exercise tolerated well No report of concerns or symptoms today  Goals Unmet:  Not Applicable  Comments: Pt able to follow exercise prescription today without complaint.  Will continue to monitor for progression.    Dr. Oneil Pinal is Medical Director for [facility] Cardiac Rehabilitation.  Dr. Fuad Aleskerov is Medical Director for [facility] Pulmonary Rehabilitation.

## 2024-03-30 ENCOUNTER — Encounter

## 2024-04-01 ENCOUNTER — Encounter

## 2024-04-02 ENCOUNTER — Ambulatory Visit

## 2024-04-06 ENCOUNTER — Encounter

## 2024-04-07 DIAGNOSIS — J449 Chronic obstructive pulmonary disease, unspecified: Secondary | ICD-10-CM

## 2024-04-07 NOTE — Progress Notes (Signed)
 Pulmonary Individual Treatment Plan  Patient Details  Name: Isaac Harrison MRN: [id number] Date of Birth: 1956-05-17 Referring Provider:   Flowsheet Row Pulmonary Rehab from 02/16/2024 in [facility] Cardiac and Pulmonary Rehab  Referring Provider Dr. Terrye Ruth, MD    Initial Encounter Date:  Flowsheet Row Pulmonary Rehab from 02/16/2024 in [facility] Cardiac and Pulmonary Rehab  Date 02/16/24    Visit Diagnosis: Stage 3 severe COPD by GOLD classification (HCC)  Patient's Home Medications on Admission:  Current Outpatient Medications:    albuterol  (PROVENTIL  HFA;VENTOLIN  HFA) 108 (90 Base) MCG/ACT inhaler, Inhale 2 puffs into the lungs every 6 (six) hours as needed for wheezing or shortness of breath., Disp: 1 Inhaler, Rfl: 0   albuterol  (PROVENTIL ) (2.5 MG/3ML) 0.083% nebulizer solution, USE 1 AMPULE BY ORAL INHALATION FOUR TIMES A DAY AS NEEDED FOR BREATHING / SHORTNESS OF BREATH, Disp: , Rfl:    albuterol  (PROVENTIL ) (2.5 MG/3ML) 0.083% nebulizer solution, Inhale into the lungs., Disp: , Rfl:    albuterol  (VENTOLIN  HFA) 108 (90 Base) MCG/ACT inhaler, 1 puff as needed Inhalation every 4 hrs, Disp: , Rfl:    amLODipine (NORVASC) 10 MG tablet, Take by mouth. (Patient not taking: Reported on 02/12/2024), Disp: , Rfl:    amLODipine-benazepril (LOTREL) 5-10 MG capsule, Take by mouth., Disp: , Rfl:    aspirin  EC 81 MG tablet, Take 81 mg by mouth., Disp: , Rfl:    atorvastatin (LIPITOR) 40 MG tablet, Take 20 mg by mouth., Disp: , Rfl:    azelastine (ASTELIN) 0.1 % nasal spray, USE 1 TO 2 SPRAYS IN INTO EACH NOSTRIL TWO TIMES A DAY AS DIRECTED, Disp: , Rfl:    azithromycin  (ZITHROMAX  Z-PAK) 250 MG tablet, Take 2 tablets (500 mg) on  Day 1,  followed by 1 tablet (250 mg) once daily on Days 2 through 5. (Patient not taking: Reported on 04/07/2018), Disp: 6 each, Rfl: 0   Budeson-Glycopyrrol-Formoterol (BREZTRI AEROSPHERE) 160-9-4.8 MCG/ACT AERO, Inhale 2 Inhalers into the lungs 2 (two) times daily., Disp: , Rfl:     budesonide-glycopyrrolate-formoterol (BREZTRI AEROSPHERE) 160-9-4.8 MCG/ACT AERO inhaler, 2 puffs Inhalation Twice a day, Disp: , Rfl:    Calcium Carb-Cholecalciferol 600-10 MG-MCG TABS, Take 1 tablet by mouth 2 (two) times daily., Disp: , Rfl:    cetirizine (ZYRTEC) 10 MG tablet, Take 1 tablet by mouth daily., Disp: , Rfl:    doxycycline  (MONODOX ) 100 MG capsule, Take 100 mg by mouth., Disp: , Rfl:    Dupilumab 300 MG/2ML SOAJ, Inject into the skin. (Patient not taking: Reported on 02/12/2024), Disp: , Rfl:    fluticasone (FLONASE) 50 MCG/ACT nasal spray, INSTILL 2 SPRAYS INTO EACH NOSTRIL ONCE EVERY DAY MAXIMUM 2 SPRAYS IN EACH NOSTRIL DAILY. FOR ALLERGIES, Disp: , Rfl:    fluticasone (FLONASE) 50 MCG/ACT nasal spray, 1-2 sprays Nasally Once a day, Disp: , Rfl:    guaiFENesin (MUCINEX) 600 MG 12 hr tablet, Take 1 tablet by mouth 2 (two) times daily., Disp: , Rfl:    methylPREDNISolone (MEDROL DOSEPAK) 4 MG TBPK tablet, See admin instructions., Disp: , Rfl:    predniSONE  (DELTASONE ) 20 MG tablet, Take 3 tablets (60 mg total) by mouth daily., Disp: 12 tablet, Rfl: 0   predniSONE  (DELTASONE ) 20 MG tablet, Take 3 tablets (60 mg total) by mouth daily. (Patient not taking: Reported on 02/12/2024), Disp: 12 tablet, Rfl: 0   Spacer/Aero Chamber Mouthpiece MISC, 1 Units by Does not apply route every 4 (four) hours as needed (wheezing)., Disp: 1 each, Rfl: 0  tiotropium (SPIRIVA) 18 MCG inhalation capsule, Place 18 mcg into inhaler and inhale., Disp: , Rfl:   Past Medical History: Past Medical History:  Diagnosis Date   COPD (chronic obstructive pulmonary disease) (HCC)     Tobacco Use: Social History   Tobacco Use  Smoking Status Former   Current packs/day: 0.00   Average packs/day: 1.5 packs/day for 35.0 years (52.5 ttl pk-yrs)   Types: Cigarettes   Start date: 03/09/1986   Quit date: 03/09/2021   Years since quitting: 3.0  Smokeless Tobacco Never  Tobacco Comments   Quit   not smoking any at  all    Labs: Review Flowsheet       Latest Ref Rng & Units 04/07/2018  Labs for ITP Cardiac and Pulmonary Rehab  Bicarbonate 20.0 - 28.0 mmol/L 27.1   Acid-base deficit 0.0 - 2.0 mmol/L 0.6      Pulmonary Assessment Scores:  Pulmonary Assessment Scores     Row Name 02/16/24 1013         ADL UCSD   ADL Phase Entry     SOB Score total 59     Rest 0     Walk 2     Stairs 4     Bath 2     Dress 2     Shop 4       CAT Score   CAT Score 20       mMRC Score   mMRC Score 2        UCSD: Self-administered rating of dyspnea associated with activities of daily living (ADLs) 6-point scale (0 = not at all to 5 = maximal or unable to do because of breathlessness)  Scoring Scores range from 0 to 120.  Minimally important difference is 5 units  CAT: CAT can identify the health impairment of COPD patients and is better correlated with disease progression.  CAT has a scoring range of zero to 40. The CAT score is classified into four groups of low (less than 10), medium (10 - 20), high (21-30) and very high (31-40) based on the impact level of disease on health status. A CAT score over 10 suggests significant symptoms.  A worsening CAT score could be explained by an exacerbation, poor medication adherence, poor inhaler technique, or progression of COPD or comorbid conditions.  CAT MCID is 2 points  mMRC: mMRC (Modified Medical Research Council) Dyspnea Scale is used to assess the degree of baseline functional disability in patients of respiratory disease due to dyspnea. No minimal important difference is established. A decrease in score of 1 point or greater is considered a positive change.   Pulmonary Function Assessment:  Pulmonary Function Assessment - 02/12/24 1014       Post Bronchodilator Spirometry Results   FEV1% 41 %    FEV1/FVC Ratio 43.03      Breath   Shortness of Breath Yes;Limiting activity;Panic with Shortness of Breath          Exercise Target  Goals: Exercise Program Goal: Individual exercise prescription set using results from initial 6 min walk test and THRR while considering  patient's activity barriers and safety.   Exercise Prescription Goal: Initial exercise prescription builds to 30-45 minutes a day of aerobic activity, 2-3 days per week.  Home exercise guidelines will be given to patient during program as part of exercise prescription that the participant will acknowledge.  Education: Aerobic Exercise: - Group verbal and visual presentation on the components of exercise prescription. Introduces F.I.T.T principle  from ACSM for exercise prescriptions.  Reviews F.I.T.T. principles of aerobic exercise including progression. Written material given at graduation. Flowsheet Row Pulmonary Rehab from 10/18/2021 in [facility] Cardiac and Pulmonary Rehab  Date 07/05/21  Educator [facility]  Instruction Review Code 1- Verbalizes Understanding    Education: Resistance Exercise: - Group verbal and visual presentation on the components of exercise prescription. Introduces F.I.T.T principle from ACSM for exercise prescriptions  Reviews F.I.T.T. principles of resistance exercise including progression. Written material given at graduation.    Education: Exercise & Equipment Safety: - Individual verbal instruction and demonstration of equipment use and safety with use of the equipment. Flowsheet Row Pulmonary Rehab from 02/16/2024 in [facility] Cardiac and Pulmonary Rehab  Date 02/12/24  Educator [facility]  Instruction Review Code 1- Verbalizes Understanding    Education: Exercise Physiology & General Exercise Guidelines: - Group verbal and written instruction with models to review the exercise physiology of the cardiovascular system and associated critical values. Provides general exercise guidelines with specific guidelines to those with heart or lung disease.  Flowsheet Row Pulmonary Rehab from 10/18/2021 in [facility] Cardiac and Pulmonary Rehab  Date 06/28/21  Educator  [facility]  Instruction Review Code 1- Verbalizes Understanding    Education: Flexibility, Balance, Mind/Body Relaxation: - Group verbal and visual presentation with interactive activity on the components of exercise prescription. Introduces F.I.T.T principle from ACSM for exercise prescriptions. Reviews F.I.T.T. principles of flexibility and balance exercise training including progression. Also discusses the mind body connection.  Reviews various relaxation techniques to help reduce and manage stress (i.e. Deep breathing, progressive muscle relaxation, and visualization). Balance handout provided to take home. Written material given at graduation. Flowsheet Row Pulmonary Rehab from 10/18/2021 in [facility] Cardiac and Pulmonary Rehab  Date 07/19/21  Educator [facility]  Instruction Review Code 1- Verbalizes Understanding    Activity Barriers & Risk Stratification:  Activity Barriers & Cardiac Risk Stratification - 02/16/24 1023       Activity Barriers & Cardiac Risk Stratification   Activity Barriers Deconditioning;Shortness of Breath          6 Minute Walk:  6 Minute Walk     Row Name 02/16/24 1023         6 Minute Walk   Phase Initial     Distance 1145 feet     Walk Time 6 minutes     # of Rest Breaks 0     MPH 2.17     METS 3.4     RPE 15     Perceived Dyspnea  3     VO2 Peak 11.89     Symptoms No     Resting HR 72 bpm     Resting BP 112/66     Resting Oxygen Saturation  96 %     Exercise Oxygen Saturation  during 6 min walk 87 %     Max Ex. HR 103 bpm     Max Ex. BP 164/72     2 Minute Post BP 152/70       Interval HR   1 Minute HR 87     2 Minute HR 95     3 Minute HR 97     4 Minute HR 94     5 Minute HR 103     6 Minute HR 103     2 Minute Post HR 89     Interval Heart Rate? Yes       Interval Oxygen   Interval Oxygen? Yes  Baseline Oxygen Saturation % 96 %     1 Minute Oxygen Saturation % 94 %     1 Minute Liters of Oxygen 0 L  RA     2 Minute Oxygen Saturation %  92 %     2 Minute Liters of Oxygen 0 L     3 Minute Oxygen Saturation % 89 %     3 Minute Liters of Oxygen 0 L     4 Minute Oxygen Saturation % 91 %     4 Minute Liters of Oxygen 0 L     5 Minute Oxygen Saturation % 87 %     5 Minute Liters of Oxygen 0 L     6 Minute Oxygen Saturation % 88 %     6 Minute Liters of Oxygen 0 L     2 Minute Post Oxygen Saturation % 96 %     2 Minute Post Liters of Oxygen 0 L       Oxygen Initial Assessment:  Oxygen Initial Assessment - 02/12/24 1014       Home Oxygen   Home Oxygen Device None    Sleep Oxygen Prescription None    Home Exercise Oxygen Prescription None    Home Resting Oxygen Prescription None      Initial 6 min Walk   Oxygen Used None      Program Oxygen Prescription   Program Oxygen Prescription None      Intervention   Short Term Goals To learn and understand importance of maintaining oxygen saturations>88%;To learn and exhibit compliance with exercise, home and travel O2 prescription;To learn and demonstrate proper use of respiratory medications;To learn and demonstrate proper pursed lip breathing techniques or other breathing techniques. ;To learn and understand importance of monitoring SPO2 with pulse oximeter and demonstrate accurate use of the pulse oximeter.    Long  Term Goals Verbalizes importance of monitoring SPO2 with pulse oximeter and return demonstration;Exhibits proper breathing techniques, such as pursed lip breathing or other method taught during program session;Demonstrates proper use of MDI's;Compliance with respiratory medication;Maintenance of O2 saturations>88%;Exhibits compliance with exercise, home  and travel O2 prescription          Oxygen Re-Evaluation:  Oxygen Re-Evaluation     Row Name 03/09/24 0737 03/18/24 0744           Program Oxygen Prescription   Program Oxygen Prescription None None        Home Oxygen   Home Oxygen Device None None      Sleep Oxygen Prescription None None      Home  Exercise Oxygen Prescription None None      Home Resting Oxygen Prescription None None      Compliance with Home Oxygen Use Yes --        Goals/Expected Outcomes   Short Term Goals To learn and understand importance of maintaining oxygen saturations>88%;To learn and exhibit compliance with exercise, home and travel O2 prescription;To learn and demonstrate proper use of respiratory medications;To learn and demonstrate proper pursed lip breathing techniques or other breathing techniques. ;To learn and understand importance of monitoring SPO2 with pulse oximeter and demonstrate accurate use of the pulse oximeter. To learn and demonstrate proper pursed lip breathing techniques or other breathing techniques.       Long  Term Goals Verbalizes importance of monitoring SPO2 with pulse oximeter and return demonstration;Exhibits proper breathing techniques, such as pursed lip breathing or other method taught during program session;Demonstrates proper use of MDI's;Compliance with  respiratory medication;Maintenance of O2 saturations>88%;Exhibits compliance with exercise, home  and travel O2 prescription Exhibits proper breathing techniques, such as pursed lip breathing or other method taught during program session      Comments Reviewed PLB technique with pt.  Talked about how it works and it's importance in maintaining their exercise saturations. Informed patient how to perform the Pursed Lipped breathing technique. Told patient to Inhale through the nose and out the mouth with pursed lips to keep their airways open, help oxygenate them better, practice when at rest or doing strenuous activity. Patient Verbalizes understanding of technique and will work on and be reiterated during [facility].      Goals/Expected Outcomes Short: Become more profiecient at using PLB. Long: Become independent at using PLB. Short: use PLB with exertion. Long: use PLB on exertion proficiently and independently.         Oxygen Discharge  (Final Oxygen Re-Evaluation):  Oxygen Re-Evaluation - 03/18/24 0744       Program Oxygen Prescription   Program Oxygen Prescription None      Home Oxygen   Home Oxygen Device None    Sleep Oxygen Prescription None    Home Exercise Oxygen Prescription None    Home Resting Oxygen Prescription None      Goals/Expected Outcomes   Short Term Goals To learn and demonstrate proper pursed lip breathing techniques or other breathing techniques.     Long  Term Goals Exhibits proper breathing techniques, such as pursed lip breathing or other method taught during program session    Comments Informed patient how to perform the Pursed Lipped breathing technique. Told patient to Inhale through the nose and out the mouth with pursed lips to keep their airways open, help oxygenate them better, practice when at rest or doing strenuous activity. Patient Verbalizes understanding of technique and will work on and be reiterated during [facility].    Goals/Expected Outcomes Short: use PLB with exertion. Long: use PLB on exertion proficiently and independently.          Initial Exercise Prescription:  Initial Exercise Prescription - 02/16/24 1000       Date of Initial Exercise RX and Referring Provider   Date 02/16/24    Referring Provider Dr. Terrye Ruth, MD      Oxygen   Maintain Oxygen Saturation 88% or higher      NuStep   Level 3    SPM 80    Minutes 15    METs 3.4      REL-XR   Level 2    Speed 50    Minutes 15    METs 3.4      Biostep-RELP   Level 2    SPM 50    Minutes 15    METs 3.4      Track   Laps 32    Minutes 15    METs 2.74      Prescription Details   Frequency (times per week) 2    Duration Progress to 30 minutes of continuous aerobic without signs/symptoms of physical distress      Intensity   THRR 40-80% of Max Heartrate 103-135    Ratings of Perceived Exertion 11-13    Perceived Dyspnea 0-4      Progression   Progression Continue to progress workloads to  maintain intensity without signs/symptoms of physical distress.      Resistance Training   Training Prescription Yes    Weight 4 lb    Reps 10-15  Perform Capillary Blood Glucose checks as needed.  Exercise Prescription Changes:   Exercise Prescription Changes     Row Name 02/16/24 1000 03/16/24 0800 03/23/24 1100 04/06/24 1300       Response to Exercise   Blood Pressure (Admit) 112/66 -- 122/60 128/68    Blood Pressure (Exercise) 164/72 -- 160/80 160/88    Blood Pressure (Exit) 152/70 -- 112/70 128/70    Heart Rate (Admit) 72 bpm -- 91 bpm 81 bpm    Heart Rate (Exercise) 103 bpm -- 125 bpm 107 bpm    Heart Rate (Exit) 89 bpm -- 99 bpm 102 bpm    Oxygen Saturation (Admit) 96 % -- 94 % 91 %    Oxygen Saturation (Exercise) 87 % -- 92 % 90 %    Oxygen Saturation (Exit) 96 % -- 96 % 95 %    Rating of Perceived Exertion (Exercise) 15 -- 13 13    Perceived Dyspnea (Exercise) 3 -- 3 2    Symptoms none -- none none    Comments Results -- first 2 weeks of exercise --    Duration -- -- Progress to 30 minutes of  aerobic without signs/symptoms of physical distress Progress to 30 minutes of  aerobic without signs/symptoms of physical distress    Intensity -- -- THRR unchanged THRR unchanged      Progression   Progression -- -- Continue to progress workloads to maintain intensity without signs/symptoms of physical distress. Continue to progress workloads to maintain intensity without signs/symptoms of physical distress.    Average METs -- -- 2.43 2.9      Resistance Training   Training Prescription -- -- Yes Yes    Weight -- -- 4lb 4 lb    Reps -- -- 10-15 10-15      Interval Training   Interval Training -- -- No No      Recumbant Bike   Level -- -- -- 4    Watts -- -- -- 23    Minutes -- -- -- 15    METs -- -- -- 3.1      NuStep   Level -- -- 3 3    Minutes -- -- 15 15    METs -- -- 2.2 2.6      REL-XR   Level -- -- 3 3    Minutes -- -- 15 15    METs --  -- 3.2 3      Biostep-RELP   Level -- -- 2 --    Minutes -- -- 15 --    METs -- -- 2 --      Track   Laps -- -- 24 --    Minutes -- -- 15 --    METs -- -- 2.31 --      Home Exercise Plan   Plans to continue exercise at -- Home (comment)  Walking, pedaler, resistance bands, and handweights Home (comment)  Walking, pedaler, resistance bands, and handweights Home (comment)  Walking, pedaler, resistance bands, and handweights    Frequency -- Add 1 additional day to program exercise sessions. Add 1 additional day to program exercise sessions. Add 1 additional day to program exercise sessions.    Initial Home Exercises Provided -- 03/16/24 03/16/24 03/16/24      Oxygen   Maintain Oxygen Saturation -- -- 88% or higher 88% or higher       Exercise Comments:   Exercise Comments     Row Name 03/09/24 [phone]  Exercise Comments First full day of exercise!  Patient was oriented to gym and equipment including functions, settings, policies, and procedures.  Patient's individual exercise prescription and treatment plan were reviewed.  All starting workloads were established based on the results of the 6 minute walk test done at initial orientation visit.  The plan for exercise progression was also introduced and progression will be customized based on patient's performance and goals.          Exercise Goals and Review:   Exercise Goals     Row Name 02/16/24 1022             Exercise Goals   Increase Physical Activity Yes       Intervention Provide advice, education, support and counseling about physical activity/exercise needs.;Develop an individualized exercise prescription for aerobic and resistive training based on initial evaluation findings, risk stratification, comorbidities and participant's personal goals.       Expected Outcomes Short Term: Attend rehab on a regular basis to increase amount of physical activity.;Long Term: Add in home exercise to make exercise part of  routine and to increase amount of physical activity.;Long Term: Exercising regularly at least 3-5 days a week.       Increase Strength and Stamina Yes       Intervention Provide advice, education, support and counseling about physical activity/exercise needs.;Develop an individualized exercise prescription for aerobic and resistive training based on initial evaluation findings, risk stratification, comorbidities and participant's personal goals.       Expected Outcomes Short Term: Increase workloads from initial exercise prescription for resistance, speed, and METs.;Short Term: Perform resistance training exercises routinely during rehab and add in resistance training at home;Long Term: Improve cardiorespiratory fitness, muscular endurance and strength as measured by increased METs and functional capacity ( )       Able to understand and use rate of perceived exertion (RPE) scale Yes       Intervention Provide education and explanation on how to use RPE scale       Expected Outcomes Short Term: Able to use RPE daily in rehab to express subjective intensity level;Long Term:  Able to use RPE to guide intensity level when exercising independently       Able to understand and use Dyspnea scale Yes       Intervention Provide education and explanation on how to use Dyspnea scale       Expected Outcomes Short Term: Able to use Dyspnea scale daily in rehab to express subjective sense of shortness of breath during exertion;Long Term: Able to use Dyspnea scale to guide intensity level when exercising independently       Knowledge and understanding of Target Heart Rate Range (THRR) Yes       Intervention Provide education and explanation of THRR including how the numbers were predicted and where they are located for reference       Expected Outcomes Long Term: Able to use THRR to govern intensity when exercising independently;Short Term: Able to state/look up THRR;Short Term: Able to use daily as guideline for  intensity in rehab       Able to check pulse independently Yes       Intervention Provide education and demonstration on how to check pulse in carotid and radial arteries.;Review the importance of being able to check your own pulse for safety during independent exercise       Expected Outcomes Short Term: Able to explain why pulse checking is important during independent exercise;Long Term: Able  to check pulse independently and accurately       Understanding of Exercise Prescription Yes       Intervention Provide education, explanation, and written materials on patient's individual exercise prescription       Expected Outcomes Short Term: Able to explain program exercise prescription;Long Term: Able to explain home exercise prescription to exercise independently          Exercise Goals Re-Evaluation :  Exercise Goals Re-Evaluation     Row Name 03/09/24 0736 03/16/24 0811 03/23/24 1117 04/06/24 1318       Exercise Goal Re-Evaluation   Exercise Goals Review Increase Physical Activity;Able to understand and use rate of perceived exertion (RPE) scale;Knowledge and understanding of Target Heart Rate Range (THRR);Understanding of Exercise Prescription;Increase Strength and Stamina;Able to understand and use Dyspnea scale;Able to check pulse independently Increase Physical Activity;Able to understand and use Dyspnea scale;Understanding of Exercise Prescription;Increase Strength and Stamina;Knowledge and understanding of Target Heart Rate Range (THRR);Able to understand and use rate of perceived exertion (RPE) scale;Able to check pulse independently Increase Physical Activity;Increase Strength and Stamina;Understanding of Exercise Prescription Increase Physical Activity;Increase Strength and Stamina;Understanding of Exercise Prescription    Comments Reviewed RPE and dyspnea scale, THR and program prescription with pt today.  Pt voiced understanding and was given a copy of goals to take home. Reviewed home  exercise with pt today from 7:50 to 7:59am.  Pt plans to walk outside, use his pedaler, and use handweights and resistance bands for exercise. He plans to add 1 additional day at home to start and progress to 2-3 days when he feels good based on his breathing.  Reviewed THR, pulse, RPE, sign and symptoms, pulse oximetery and when to call 911 or MD.  Also discussed weather considerations and indoor options.  Pt voiced understanding. Garrel is doing wel in rehab. He was able to attend his first 4 sessions of the program during this review period. During his first few sessions he was able to increase to level 3 on the XR and walk 24 laps on the track. We will continue to monitor his progress in the program. Garrel is doing well in rehab and has only attended two sessions since the last review. He continues to work at level 3 on the T4 nustep and the XR. He also began using the recumbent bike and did well at level 4. We will continue to monitor his progress in the program.    Expected Outcomes Short: Use RPE daily to regulate intensity. Long: Follow program prescription in THR. Short: Add 1 additional day of exercise at home. Long: Continue to exercise independently at home Short: Continue to follow exercise prescription. Long: Continue exercise to improve strength and stamina. Short: Attend rehab more consistently. Long: Continue exercise to improve strength and stamina.       Discharge Exercise Prescription (Final Exercise Prescription Changes):  Exercise Prescription Changes - 04/06/24 1300       Response to Exercise   Blood Pressure (Admit) 128/68    Blood Pressure (Exercise) 160/88    Blood Pressure (Exit) 128/70    Heart Rate (Admit) 81 bpm    Heart Rate (Exercise) 107 bpm    Heart Rate (Exit) 102 bpm    Oxygen Saturation (Admit) 91 %    Oxygen Saturation (Exercise) 90 %    Oxygen Saturation (Exit) 95 %    Rating of Perceived Exertion (Exercise) 13    Perceived Dyspnea (Exercise) 2    Symptoms  none  Duration Progress to 30 minutes of  aerobic without signs/symptoms of physical distress    Intensity THRR unchanged      Progression   Progression Continue to progress workloads to maintain intensity without signs/symptoms of physical distress.    Average METs 2.9      Resistance Training   Training Prescription Yes    Weight 4 lb    Reps 10-15      Interval Training   Interval Training No      Recumbant Bike   Level 4    Watts 23    Minutes 15    METs 3.1      NuStep   Level 3    Minutes 15    METs 2.6      REL-XR   Level 3    Minutes 15    METs 3      Home Exercise Plan   Plans to continue exercise at Home (comment)   Walking, pedaler, resistance bands, and handweights   Frequency Add 1 additional day to program exercise sessions.    Initial Home Exercises Provided 03/16/24      Oxygen   Maintain Oxygen Saturation 88% or higher          Nutrition:  Target Goals: Understanding of nutrition guidelines, daily intake of sodium 1500mg , cholesterol 200mg , calories 30% from fat and 7% or less from saturated fats, daily to have 5 or more servings of fruits and vegetables.  Education: All About Nutrition: -Group instruction provided by verbal, written material, interactive activities, discussions, models, and posters to present general guidelines for heart healthy nutrition including fat, fiber, MyPlate, the role of sodium in heart healthy nutrition, utilization of the nutrition label, and utilization of this knowledge for meal planning. Follow up email sent as well. Written material given at graduation. Flowsheet Row Pulmonary Rehab from 10/18/2021 in [facility] Cardiac and Pulmonary Rehab  Date 05/31/21  Educator [facility]  Instruction Review Code 1- Verbalizes Understanding    Biometrics:  Pre Biometrics - 02/16/24 1021       Pre Biometrics   Height 5' 11.25 (1.81 m)    Weight 158 lb 4.8 oz (71.8 kg)    Waist Circumference 36 inches    Hip Circumference 35  inches    Waist to Hip Ratio 1.03 %    BMI (Calculated) 21.92    Single Leg Stand 30 seconds           Nutrition Therapy Plan and Nutrition Goals:  Nutrition Therapy & Goals - 02/16/24 1017       Nutrition Therapy   RD appointment deferred Yes      Intervention Plan   Intervention Prescribe, educate and counsel regarding individualized specific dietary modifications aiming towards targeted core components such as weight, hypertension, lipid management, diabetes, heart failure and other comorbidities.    Expected Outcomes Short Term Goal: Understand basic principles of dietary content, such as calories, fat, sodium, cholesterol and nutrients.;Short Term Goal: A plan has been developed with personal nutrition goals set during dietitian appointment.;Long Term Goal: Adherence to prescribed nutrition plan.          Nutrition Assessments:  MEDIFICTS Score Key: >=70 Need to make dietary changes  40-70 Heart Healthy Diet <= 40 Therapeutic Level Cholesterol Diet  Flowsheet Row Pulmonary Rehab from 02/16/2024 in [facility] Cardiac and Pulmonary Rehab  Picture Your Plate Total Score on Admission 55   Picture Your Plate Scores: <59 Unhealthy dietary pattern with much room for improvement. 41-50 Dietary  pattern unlikely to meet recommendations for good health and room for improvement. 51-60 More healthful dietary pattern, with some room for improvement.  >60 Healthy dietary pattern, although there may be some specific behaviors that could be improved.   Nutrition Goals Re-Evaluation:  Nutrition Goals Re-Evaluation     Row Name 03/18/24 0746             Goals   Comment Patient was informed on why it is important to maintain a balanced diet when dealing with Respiratory issues. Explained that it takes a lot of energy to breath and when they are short of breath often they will need to have a good diet to help keep up with the calories they are expending for breathing.       Expected  Outcome Short: Choose and plan snacks accordingly to patients caloric intake to improve breathing. Long: Maintain a diet independently that meets their caloric intake to aid in daily shortness of breath.          Nutrition Goals Discharge (Final Nutrition Goals Re-Evaluation):  Nutrition Goals Re-Evaluation - 03/18/24 0746       Goals   Comment Patient was informed on why it is important to maintain a balanced diet when dealing with Respiratory issues. Explained that it takes a lot of energy to breath and when they are short of breath often they will need to have a good diet to help keep up with the calories they are expending for breathing.    Expected Outcome Short: Choose and plan snacks accordingly to patients caloric intake to improve breathing. Long: Maintain a diet independently that meets their caloric intake to aid in daily shortness of breath.          Psychosocial: Target Goals: Acknowledge presence or absence of significant depression and/or stress, maximize coping skills, provide positive support system. Participant is able to verbalize types and ability to use techniques and skills needed for reducing stress and depression.   Education: Stress, Anxiety, and Depression - Group verbal and visual presentation to define topics covered.  Reviews how body is impacted by stress, anxiety, and depression.  Also discusses healthy ways to reduce stress and to treat/manage anxiety and depression.  Written material given at graduation. Flowsheet Row Pulmonary Rehab from 10/18/2021 in [facility] Cardiac and Pulmonary Rehab  Date 06/21/21  Educator [facility]  Instruction Review Code 1- [employer] Understanding    Education: Sleep Hygiene -Provides group verbal and written instruction about how sleep can affect your health.  Define sleep hygiene, discuss sleep cycles and impact of sleep habits. Review good sleep hygiene tips.    Initial Review & Psychosocial Screening:  Initial Psych Review &  Screening - 02/12/24 1015       Initial Review   Current issues with None Identified      Family Dynamics   Good Support System? Yes    Comments Garrel can look to his wife, daughter and friends for support.      Barriers   Psychosocial barriers to participate in program The patient should benefit from training in stress management and relaxation.;There are no identifiable barriers or psychosocial needs.      Screening Interventions   Interventions Provide feedback about the scores to participant;To provide support and resources with identified psychosocial needs;Encouraged to exercise    Expected Outcomes Short Term goal: Utilizing psychosocial counselor, staff and physician to assist with identification of specific Stressors or current issues interfering with healing process. Setting desired goal for each stressor  or current issue identified.;Long Term Goal: Stressors or current issues are controlled or eliminated.;Short Term goal: Identification and review with participant of any Quality of Life or Depression concerns found by scoring the questionnaire.;Long Term goal: The participant improves quality of Life and PHQ9 Scores as seen by post scores and/or verbalization of changes          Quality of Life Scores:  Scores of 19 and below usually indicate a poorer quality of life in these areas.  A difference of  2-3 points is a clinically meaningful difference.  A difference of 2-3 points in the total score of the Quality of Life Index has been associated with significant improvement in overall quality of life, self-image, physical symptoms, and general health in studies assessing change in quality of life.  PHQ-9: Review Flowsheet       02/16/2024 10/18/2021 06/21/2021 05/24/2021  Depression screen PHQ 2/9  Decreased Interest 1 0 1 1  Down, Depressed, Hopeless 0 0 0 0  PHQ - 2 Score 1 0 1 1  Altered sleeping 0 0 0 1  Tired, decreased energy 2 0 1 2  Change in appetite 0 0 0 0  Feeling  bad or failure about yourself  0 0 0 0  Trouble concentrating 0 0 0 0  Moving slowly or fidgety/restless 1 0 1 1  Suicidal thoughts 0 0 0 0  PHQ-9 Score 4 0 3 5  Difficult doing work/chores Not difficult at all Somewhat difficult Somewhat difficult Not difficult at all   Interpretation of Total Score  Total Score Depression Severity:  1-4 = Minimal depression, 5-9 = Mild depression, 10-14 = Moderate depression, 15-19 = Moderately severe depression, 20-27 = Severe depression   Psychosocial Evaluation and Intervention:  Psychosocial Evaluation - 02/12/24 1016       Psychosocial Evaluation & Interventions   Interventions Encouraged to exercise with the program and follow exercise prescription;Relaxation education;Stress management education    Comments Garrel can look to his wife, daughter and friends for support.    Expected Outcomes Short: Start [facility] to help with mood. Long: Maintain a healthy mental state    Continue Psychosocial Services  Follow up required by staff          Psychosocial Re-Evaluation:  Psychosocial Re-Evaluation     Row Name 03/18/24 [id number]             Psychosocial Re-Evaluation   Current issues with None Identified       Comments Patient reports no issues with their current mental states, sleep, stress, depression or anxiety. Will follow up with patient in a few weeks for any changes.       Expected Outcomes Short: Continue to exercise regularly to support mental health and notify staff of any changes. Long: maintain mental health and well being through teaching of rehab or prescribed medications independently.       Interventions Encouraged to attend Pulmonary Rehabilitation for the exercise       Continue Psychosocial Services  Follow up required by staff          Psychosocial Discharge (Final Psychosocial Re-Evaluation):  Psychosocial Re-Evaluation - 03/18/24 0747       Psychosocial Re-Evaluation   Current issues with None Identified     Comments Patient reports no issues with their current mental states, sleep, stress, depression or anxiety. Will follow up with patient in a few weeks for any changes.    Expected Outcomes Short: Continue to exercise regularly to support mental  health and notify staff of any changes. Long: maintain mental health and well being through teaching of rehab or prescribed medications independently.    Interventions Encouraged to attend Pulmonary Rehabilitation for the exercise    Continue Psychosocial Services  Follow up required by staff          Education: Education Goals: Education classes will be provided on a weekly basis, covering required topics. Participant will state understanding/return demonstration of topics presented.  Learning Barriers/Preferences:  Learning Barriers/Preferences - 02/12/24 1013       Learning Barriers/Preferences   Learning Barriers Hearing    Learning Preferences None          General Pulmonary Education Topics:  Infection Prevention: - Provides verbal and written material to individual with discussion of infection control including proper hand washing and proper equipment cleaning during exercise session. Flowsheet Row Pulmonary Rehab from 02/16/2024 in [facility] Cardiac and Pulmonary Rehab  Date 02/12/24  Educator [facility]  Instruction Review Code 1- Verbalizes Understanding    Falls Prevention: - Provides verbal and written material to individual with discussion of falls prevention and safety. Flowsheet Row Pulmonary Rehab from 02/16/2024 in [facility] Cardiac and Pulmonary Rehab  Date 02/12/24  Educator [facility]  Instruction Review Code 1- Verbalizes Understanding    Chronic Lung Disease Review: - Group verbal instruction with posters, models, PowerPoint presentations and videos,  to review new updates, new respiratory medications, new advancements in procedures and treatments. Providing information on websites and 800 numbers for continued self-education. Includes  information about supplement oxygen, available portable oxygen systems, continuous and intermittent flow rates, oxygen safety, concentrators, and Medicare reimbursement for oxygen. Explanation of Pulmonary Drugs, including class, frequency, complications, importance of spacers, rinsing mouth after steroid MDI's, and proper cleaning methods for nebulizers. Review of basic lung anatomy and physiology related to function, structure, and complications of lung disease. Review of risk factors. Discussion about methods for diagnosing sleep apnea and types of masks and machines for OSA. Includes a review of the use of types of environmental controls: home humidity, furnaces, filters, dust mite/pet prevention, HEPA vacuums. Discussion about weather changes, air quality and the benefits of nasal washing. Instruction on Warning signs, infection symptoms, calling MD promptly, preventive modes, and value of vaccinations. Review of effective airway clearance, coughing and/or vibration techniques. Emphasizing that all should Create an Action Plan. Written material given at graduation. Flowsheet Row Pulmonary Rehab from 02/16/2024 in [facility] Cardiac and Pulmonary Rehab  Education need identified 02/16/24    AED/CPR: - Group verbal and written instruction with the use of models to demonstrate the basic use of the AED with the basic ABC's of resuscitation.    Anatomy and Cardiac Procedures: - Group verbal and visual presentation and models provide information about basic cardiac anatomy and function. Reviews the testing methods done to diagnose heart disease and the outcomes of the test results. Describes the treatment choices: Medical Management, Angioplasty, or Coronary Bypass Surgery for treating various heart conditions including Myocardial Infarction, Angina, Valve Disease, and Cardiac Arrhythmias.  Written material given at graduation.   Medication Safety: - Group verbal and visual instruction to review commonly  prescribed medications for heart and lung disease. Reviews the medication, class of the drug, and side effects. Includes the steps to properly store meds and maintain the prescription regimen.  Written material given at graduation. Flowsheet Row [facility]  Date 09/27/21  Educator [facility]  Instruction Review Code 1- [employer]  Other: -Provides group and verbal instruction on various topics (see comments)   Knowledge Questionnaire Score:  Knowledge Questionnaire Score - 02/16/24 1016       Knowledge Questionnaire Score   Pre Score 17/18           Core Components/Risk Factors/Patient Goals at Admission:  Personal Goals and Risk Factors at Admission - 02/12/24 1014       Core Components/Risk Factors/Patient Goals on Admission    Weight Management Yes;Weight Maintenance    Intervention Weight Management: Develop a combined nutrition and exercise program designed to reach desired caloric intake, while maintaining appropriate intake of nutrient and fiber, sodium and fats, and appropriate energy expenditure required for the weight goal.;Weight Management: Provide education and appropriate resources to help participant work on and attain dietary goals.;Weight Management/Obesity: Establish reasonable short term and long term weight goals.    Expected Outcomes Short Term: Continue to assess and modify interventions until short term weight is achieved;Weight Maintenance: Understanding of the daily nutrition guidelines, which includes 25-35% calories from fat, 7% or less cal from saturated fats, less than 200mg  cholesterol, less than 1.5gm of sodium, & 5 or more servings of fruits and vegetables daily;Understanding recommendations for meals to include 15-35% energy as protein, 25-35% energy from fat, 35-60% energy from carbohydrates, less than 200mg  of dietary cholesterol, 20-35 gm of total fiber daily;Understanding of distribution  of calorie intake throughout the day with the consumption of 4-5 meals/snacks    Improve shortness of breath with ADL's Yes    Intervention Provide education, individualized exercise plan and daily activity instruction to help decrease symptoms of SOB with activities of daily living.    Expected Outcomes Short Term: Improve cardiorespiratory fitness to achieve a reduction of symptoms when performing ADLs;Long Term: Be able to perform more ADLs without symptoms or delay the onset of symptoms    Hypertension Yes    Intervention Provide education on lifestyle modifcations including regular physical activity/exercise, weight management, moderate sodium restriction and increased consumption of fresh fruit, vegetables, and low fat dairy, alcohol moderation, and smoking cessation.;Monitor prescription use compliance.    Expected Outcomes Short Term: Continued assessment and intervention until BP is < 140/30mm HG in hypertensive participants. < 130/85mm HG in hypertensive participants with diabetes, heart failure or chronic kidney disease.;Long Term: Maintenance of blood pressure at goal levels.          Education:Diabetes - Individual verbal and written instruction to review signs/symptoms of diabetes, desired ranges of glucose level fasting, after meals and with exercise. Acknowledge that pre and post exercise glucose checks will be done for 3 sessions at entry of program.   Know Your Numbers and Heart Failure: - Group verbal and visual instruction to discuss disease risk factors for cardiac and pulmonary disease and treatment options.  Reviews associated critical values for Overweight/Obesity, Hypertension, Cholesterol, and Diabetes.  Discusses basics of heart failure: signs/symptoms and treatments.  Introduces Heart Failure Zone chart for action plan for heart failure.  Written material given at graduation. Flowsheet Row Pulmonary Rehab from 10/18/2021 in [facility] Cardiac and Pulmonary Rehab  Date 10/11/21   Educator [facility]  Instruction Review Code 1- Verbalizes Understanding    Core Components/Risk Factors/Patient Goals Review:   Goals and Risk Factor Review     Row Name 03/18/24 0745             Core Components/Risk Factors/Patient Goals Review   Personal Goals Review Improve shortness of breath with ADL's       Review  Spoke to patient about their shortness of breath and what they can do to improve. Patient has been informed of breathing techniques when starting the program. Patient is informed to tell staff if they have had any med changes and that certain meds they are taking or not taking can be causing shortness of breath.       Expected Outcomes Short: Attend [facility] regularly to improve shortness of breath with ADL's. Long: maintain independence with ADL's          Core Components/Risk Factors/Patient Goals at Discharge (Final Review):   Goals and Risk Factor Review - 03/18/24 0745       Core Components/Risk Factors/Patient Goals Review   Personal Goals Review Improve shortness of breath with ADL's    Review Spoke to patient about their shortness of breath and what they can do to improve. Patient has been informed of breathing techniques when starting the program. Patient is informed to tell staff if they have had any med changes and that certain meds they are taking or not taking can be causing shortness of breath.    Expected Outcomes Short: Attend [facility] regularly to improve shortness of breath with ADL's. Long: maintain independence with ADL's          ITP Comments:  ITP Comments     Row Name 02/12/24 1020 02/16/24 1011 03/09/24 0736 03/10/24 1046 04/07/24 1002   ITP Comments Virtual Visit completed. Patient informed on EP and RD appointment and 6 Minute walk test. Patient also informed of patient health questionnaires on My Chart. Patient Verbalizes understanding. Visit diagnosis can be found in [facility] media. Patient [facility]. Completed and gym orientation for pulmonary  rehab. Initial ITP created and sent for review to Dr. Faud Aleskerov, Medical Director. First full day of exercise!  Patient was oriented to gym and equipment including functions, settings, policies, and procedures.  Patient's individual exercise prescription and treatment plan were reviewed.  All starting workloads were established based on the results of the 6 minute walk test done at initial orientation visit.  The plan for exercise progression was also introduced and progression will be customized based on patient's performance and goals. 30 Day review completed. Medical Director ITP review done, changes made as directed, and signed approval by Medical Director. 30 Day review completed. Medical Director ITP review done, changes made as directed, and signed approval by Medical Director.      Comments: 30 day review

## 2024-04-08 ENCOUNTER — Encounter

## 2024-04-13 ENCOUNTER — Encounter

## 2024-04-15 ENCOUNTER — Encounter

## 2024-04-20 ENCOUNTER — Encounter: Attending: Pulmonary Disease

## 2024-04-20 DIAGNOSIS — J449 Chronic obstructive pulmonary disease, unspecified: Secondary | ICD-10-CM | POA: Diagnosis present

## 2024-04-20 DIAGNOSIS — R0609 Other forms of dyspnea: Secondary | ICD-10-CM | POA: Diagnosis present

## 2024-04-20 NOTE — Progress Notes (Signed)
 Daily Session Note  Patient Details  Name: Isaac Harrison MRN: [id number] Date of Birth: Jul 22, 1956 Referring Provider:   Flowsheet Row Pulmonary Rehab from 02/16/2024 in [facility] Cardiac and Pulmonary Rehab  Referring Provider Dr. Terrye Ruth, MD    Encounter Date: 04/20/2024  Check In:  Session Check In - 04/20/24 0720       Check-In   Supervising physician immediately available to respond to emergencies See telemetry face sheet for immediately available ER MD    Location [facility]-Cardiac & Pulmonary Rehab    Staff Present Burnard Davenport RN,BSN,MPA;Jason Elnor RDN,LDN;Margaret Best, MS, Exercise Physiologist    Virtual Visit No    Medication changes reported     No    Fall or balance concerns reported    No    Warm-up and Cool-down Performed on first and last piece of equipment    Resistance Training Performed Yes    VAD Patient? No    PAD/SET Patient? No      Pain Assessment   Currently in Pain? No/denies             Social History   Tobacco Use  Smoking Status Former   Current packs/day: 0.00   Average packs/day: 1.5 packs/day for 35.0 years (52.5 ttl pk-yrs)   Types: Cigarettes   Start date: 03/09/1986   Quit date: 03/09/2021   Years since quitting: 3.1  Smokeless Tobacco Never  Tobacco Comments   Quit   not smoking any at all    Goals Met:  Proper associated with RPD/PD & O2 Sat Independence with exercise equipment Using PLB without cueing & demonstrates good technique Exercise tolerated well No report of concerns or symptoms today Strength training completed today  Goals Unmet:  Not Applicable  Comments: Pt able to follow exercise prescription today without complaint.  Will continue to monitor for progression.    Dr. Oneil Pinal is Medical Director for [facility] Cardiac Rehabilitation.  Dr. Fuad Aleskerov is Medical Director for [facility] Pulmonary Rehabilitation.

## 2024-04-22 ENCOUNTER — Encounter

## 2024-04-27 ENCOUNTER — Encounter

## 2024-04-28 ENCOUNTER — Encounter: Payer: Self-pay | Admitting: *Deleted

## 2024-04-28 DIAGNOSIS — J449 Chronic obstructive pulmonary disease, unspecified: Secondary | ICD-10-CM

## 2024-04-28 NOTE — Progress Notes (Signed)
 Early Discharge Summary  [name] [birth year]  Ozell contacted staff requesting to be discharged at this time. With the heat and humidity, he feels like this is not the best time for him to participate and wants to start when the weather has changed and it is easier for him to breathe. He will contact his doctor at the [facility] to inform them of this decision and request a new referral when ready to start. He compelted 8 of 36 sessions.    6 Minute Walk     Row Name 02/16/24 1023         6 Minute Walk   Phase Initial     Distance 1145 feet     Walk Time 6 minutes     # of Rest Breaks 0     MPH 2.17     METS 3.4     RPE 15     Perceived Dyspnea  3     VO2 Peak 11.89     Symptoms No     Resting HR 72 bpm     Resting BP 112/66     Resting Oxygen Saturation  96 %     Exercise Oxygen Saturation  during 6 min walk 87 %     Max Ex. HR 103 bpm     Max Ex. BP 164/72     2 Minute Post BP 152/70       Interval HR   1 Minute HR 87     2 Minute HR 95     3 Minute HR 97     4 Minute HR 94     5 Minute HR 103     6 Minute HR 103     2 Minute Post HR 89     Interval Heart Rate? Yes       Interval Oxygen   Interval Oxygen? Yes     Baseline Oxygen Saturation % 96 %     1 Minute Oxygen Saturation % 94 %     1 Minute Liters of Oxygen 0 L  RA     2 Minute Oxygen Saturation % 92 %     2 Minute Liters of Oxygen 0 L     3 Minute Oxygen Saturation % 89 %     3 Minute Liters of Oxygen 0 L     4 Minute Oxygen Saturation % 91 %     4 Minute Liters of Oxygen 0 L     5 Minute Oxygen Saturation % 87 %     5 Minute Liters of Oxygen 0 L     6 Minute Oxygen Saturation % 88 %     6 Minute Liters of Oxygen 0 L     2 Minute Post Oxygen Saturation % 96 %     2 Minute Post Liters of Oxygen 0 L

## 2024-04-28 NOTE — Progress Notes (Signed)
 Pulmonary Individual Treatment Plan  Patient Details  Name: IZAC FAULKENBERRY MRN: [id number] Date of Birth: 25-Aug-1956 Referring Provider:   Flowsheet Row Pulmonary Rehab from 02/16/2024 in [facility] Cardiac and Pulmonary Rehab  Referring Provider Dr. Terrye Ruth, MD    Initial Encounter Date:  Flowsheet Row Pulmonary Rehab from 02/16/2024 in [facility] Cardiac and Pulmonary Rehab  Date 02/16/24    Visit Diagnosis: Stage 3 severe COPD by GOLD classification (HCC)  Patient's Home Medications on Admission:  Current Outpatient Medications:    albuterol  (PROVENTIL  HFA;VENTOLIN  HFA) 108 (90 Base) MCG/ACT inhaler, Inhale 2 puffs into the lungs every 6 (six) hours as needed for wheezing or shortness of breath., Disp: 1 Inhaler, Rfl: 0   albuterol  (PROVENTIL ) (2.5 MG/3ML) 0.083% nebulizer solution, USE 1 AMPULE BY ORAL INHALATION FOUR TIMES A DAY AS NEEDED FOR BREATHING / SHORTNESS OF BREATH, Disp: , Rfl:    albuterol  (PROVENTIL ) (2.5 MG/3ML) 0.083% nebulizer solution, Inhale into the lungs., Disp: , Rfl:    albuterol  (VENTOLIN  HFA) 108 (90 Base) MCG/ACT inhaler, 1 puff as needed Inhalation every 4 hrs, Disp: , Rfl:    amLODipine (NORVASC) 10 MG tablet, Take by mouth. (Patient not taking: Reported on 02/12/2024), Disp: , Rfl:    amLODipine-benazepril (LOTREL) 5-10 MG capsule, Take by mouth., Disp: , Rfl:    aspirin  EC 81 MG tablet, Take 81 mg by mouth., Disp: , Rfl:    atorvastatin (LIPITOR) 40 MG tablet, Take 20 mg by mouth., Disp: , Rfl:    azelastine (ASTELIN) 0.1 % nasal spray, USE 1 TO 2 SPRAYS IN INTO EACH NOSTRIL TWO TIMES A DAY AS DIRECTED, Disp: , Rfl:    azithromycin  (ZITHROMAX  Z-PAK) 250 MG tablet, Take 2 tablets (500 mg) on  Day 1,  followed by 1 tablet (250 mg) once daily on Days 2 through 5. (Patient not taking: Reported on 04/07/2018), Disp: 6 each, Rfl: 0   Budeson-Glycopyrrol-Formoterol (BREZTRI AEROSPHERE) 160-9-4.8 MCG/ACT AERO, Inhale 2 Inhalers into the lungs 2 (two) times daily., Disp: , Rfl:     budesonide-glycopyrrolate-formoterol (BREZTRI AEROSPHERE) 160-9-4.8 MCG/ACT AERO inhaler, 2 puffs Inhalation Twice a day, Disp: , Rfl:    Calcium Carb-Cholecalciferol 600-10 MG-MCG TABS, Take 1 tablet by mouth 2 (two) times daily., Disp: , Rfl:    cetirizine (ZYRTEC) 10 MG tablet, Take 1 tablet by mouth daily., Disp: , Rfl:    doxycycline  (MONODOX ) 100 MG capsule, Take 100 mg by mouth., Disp: , Rfl:    Dupilumab 300 MG/2ML SOAJ, Inject into the skin. (Patient not taking: Reported on 02/12/2024), Disp: , Rfl:    fluticasone (FLONASE) 50 MCG/ACT nasal spray, INSTILL 2 SPRAYS INTO EACH NOSTRIL ONCE EVERY DAY MAXIMUM 2 SPRAYS IN EACH NOSTRIL DAILY. FOR ALLERGIES, Disp: , Rfl:    fluticasone (FLONASE) 50 MCG/ACT nasal spray, 1-2 sprays Nasally Once a day, Disp: , Rfl:    guaiFENesin (MUCINEX) 600 MG 12 hr tablet, Take 1 tablet by mouth 2 (two) times daily., Disp: , Rfl:    methylPREDNISolone (MEDROL DOSEPAK) 4 MG TBPK tablet, See admin instructions., Disp: , Rfl:    predniSONE  (DELTASONE ) 20 MG tablet, Take 3 tablets (60 mg total) by mouth daily., Disp: 12 tablet, Rfl: 0   predniSONE  (DELTASONE ) 20 MG tablet, Take 3 tablets (60 mg total) by mouth daily. (Patient not taking: Reported on 02/12/2024), Disp: 12 tablet, Rfl: 0   Spacer/Aero Chamber Mouthpiece MISC, 1 Units by Does not apply route every 4 (four) hours as needed (wheezing)., Disp: 1 each, Rfl: 0  tiotropium (SPIRIVA) 18 MCG inhalation capsule, Place 18 mcg into inhaler and inhale., Disp: , Rfl:   Past Medical History: Past Medical History:  Diagnosis Date   COPD (chronic obstructive pulmonary disease) (HCC)     Tobacco Use: Social History   Tobacco Use  Smoking Status Former   Current packs/day: 0.00   Average packs/day: 1.5 packs/day for 35.0 years (52.5 ttl pk-yrs)   Types: Cigarettes   Start date: 03/09/1986   Quit date: 03/09/2021   Years since quitting: 3.1  Smokeless Tobacco Never  Tobacco Comments   Quit   not smoking any at  all    Labs: Review Flowsheet       Latest Ref Rng & Units 04/07/2018  Labs for ITP Cardiac and Pulmonary Rehab  Bicarbonate 20.0 - 28.0 mmol/L 27.1   Acid-base deficit 0.0 - 2.0 mmol/L 0.6      Pulmonary Assessment Scores:  Pulmonary Assessment Scores     Row Name 02/16/24 1013         ADL UCSD   ADL Phase Entry     SOB Score total 59     Rest 0     Walk 2     Stairs 4     Bath 2     Dress 2     Shop 4       CAT Score   CAT Score 20       mMRC Score   mMRC Score 2        UCSD: Self-administered rating of dyspnea associated with activities of daily living (ADLs) 6-point scale (0 = not at all to 5 = maximal or unable to do because of breathlessness)  Scoring Scores range from 0 to 120.  Minimally important difference is 5 units  CAT: CAT can identify the health impairment of COPD patients and is better correlated with disease progression.  CAT has a scoring range of zero to 40. The CAT score is classified into four groups of low (less than 10), medium (10 - 20), high (21-30) and very high (31-40) based on the impact level of disease on health status. A CAT score over 10 suggests significant symptoms.  A worsening CAT score could be explained by an exacerbation, poor medication adherence, poor inhaler technique, or progression of COPD or comorbid conditions.  CAT MCID is 2 points  mMRC: mMRC (Modified Medical Research Council) Dyspnea Scale is used to assess the degree of baseline functional disability in patients of respiratory disease due to dyspnea. No minimal important difference is established. A decrease in score of 1 point or greater is considered a positive change.   Pulmonary Function Assessment:  Pulmonary Function Assessment - 02/12/24 1014       Post Bronchodilator Spirometry Results   FEV1% 41 %    FEV1/FVC Ratio 43.03      Breath   Shortness of Breath Yes;Limiting activity;Panic with Shortness of Breath          Exercise Target  Goals: Exercise Program Goal: Individual exercise prescription set using results from initial 6 min walk test and THRR while considering  patient's activity barriers and safety.   Exercise Prescription Goal: Initial exercise prescription builds to 30-45 minutes a day of aerobic activity, 2-3 days per week.  Home exercise guidelines will be given to patient during program as part of exercise prescription that the participant will acknowledge.  Education: Aerobic Exercise: - Group verbal and visual presentation on the components of exercise prescription. Introduces F.I.T.T principle  from ACSM for exercise prescriptions.  Reviews F.I.T.T. principles of aerobic exercise including progression. Written material provided at class time. Flowsheet Row Pulmonary Rehab from 10/18/2021 in [facility] Cardiac and Pulmonary Rehab  Date 07/05/21  Educator [facility]  Instruction Review Code 1- Verbalizes Understanding    Education: Resistance Exercise: - Group verbal and visual presentation on the components of exercise prescription. Introduces F.I.T.T principle from ACSM for exercise prescriptions  Reviews F.I.T.T. principles of resistance exercise including progression. Written material provided at class time.    Education: Exercise & Equipment Safety: - Individual verbal instruction and demonstration of equipment use and safety with use of the equipment. Flowsheet Row Pulmonary Rehab from 02/16/2024 in [facility] Cardiac and Pulmonary Rehab  Date 02/12/24  Educator [facility]  Instruction Review Code 1- Verbalizes Understanding    Education: Exercise Physiology & General Exercise Guidelines: - Group verbal and written instruction with models to review the exercise physiology of the cardiovascular system and associated critical values. Provides general exercise guidelines with specific guidelines to those with heart or lung disease.  Flowsheet Row Pulmonary Rehab from 10/18/2021 in [facility] Cardiac and Pulmonary Rehab  Date 06/28/21   Educator [facility]  Instruction Review Code 1- Verbalizes Understanding    Education: Flexibility, Balance, Mind/Body Relaxation: - Group verbal and visual presentation with interactive activity on the components of exercise prescription. Introduces F.I.T.T principle from ACSM for exercise prescriptions. Reviews F.I.T.T. principles of flexibility and balance exercise training including progression. Also discusses the mind body connection.  Reviews various relaxation techniques to help reduce and manage stress (i.e. Deep breathing, progressive muscle relaxation, and visualization). Balance handout provided to take home. Written material provided at class time. Flowsheet Row Pulmonary Rehab from 10/18/2021 in [facility] Cardiac and Pulmonary Rehab  Date 07/19/21  Educator [facility]  Instruction Review Code 1- Verbalizes Understanding    Activity Barriers & Risk Stratification:  Activity Barriers & Cardiac Risk Stratification - 02/16/24 1023       Activity Barriers & Cardiac Risk Stratification   Activity Barriers Deconditioning;Shortness of Breath          6 Minute Walk:  6 Minute Walk     Row Name 02/16/24 1023         6 Minute Walk   Phase Initial     Distance 1145 feet     Walk Time 6 minutes     # of Rest Breaks 0     MPH 2.17     METS 3.4     RPE 15     Perceived Dyspnea  3     VO2 Peak 11.89     Symptoms No     Resting HR 72 bpm     Resting BP 112/66     Resting Oxygen Saturation  96 %     Exercise Oxygen Saturation  during 6 min walk 87 %     Max Ex. HR 103 bpm     Max Ex. BP 164/72     2 Minute Post BP 152/70       Interval HR   1 Minute HR 87     2 Minute HR 95     3 Minute HR 97     4 Minute HR 94     5 Minute HR 103     6 Minute HR 103     2 Minute Post HR 89     Interval Heart Rate? Yes       Interval Oxygen   Interval Oxygen? Yes  Baseline Oxygen Saturation % 96 %     1 Minute Oxygen Saturation % 94 %     1 Minute Liters of Oxygen 0 L  RA     2 Minute Oxygen  Saturation % 92 %     2 Minute Liters of Oxygen 0 L     3 Minute Oxygen Saturation % 89 %     3 Minute Liters of Oxygen 0 L     4 Minute Oxygen Saturation % 91 %     4 Minute Liters of Oxygen 0 L     5 Minute Oxygen Saturation % 87 %     5 Minute Liters of Oxygen 0 L     6 Minute Oxygen Saturation % 88 %     6 Minute Liters of Oxygen 0 L     2 Minute Post Oxygen Saturation % 96 %     2 Minute Post Liters of Oxygen 0 L       Oxygen Initial Assessment:  Oxygen Initial Assessment - 02/12/24 1014       Home Oxygen   Home Oxygen Device None    Sleep Oxygen Prescription None    Home Exercise Oxygen Prescription None    Home Resting Oxygen Prescription None      Initial 6 min Walk   Oxygen Used None      Program Oxygen Prescription   Program Oxygen Prescription None      Intervention   Short Term Goals To learn and understand importance of maintaining oxygen saturations>88%;To learn and exhibit compliance with exercise, home and travel O2 prescription;To learn and demonstrate proper use of respiratory medications;To learn and demonstrate proper pursed lip breathing techniques or other breathing techniques. ;To learn and understand importance of monitoring SPO2 with pulse oximeter and demonstrate accurate use of the pulse oximeter.    Long  Term Goals Verbalizes importance of monitoring SPO2 with pulse oximeter and return demonstration;Exhibits proper breathing techniques, such as pursed lip breathing or other method taught during program session;Demonstrates proper use of MDI's;Compliance with respiratory medication;Maintenance of O2 saturations>88%;Exhibits compliance with exercise, home  and travel O2 prescription          Oxygen Re-Evaluation:  Oxygen Re-Evaluation     Row Name 03/09/24 0737 03/18/24 0744           Program Oxygen Prescription   Program Oxygen Prescription None None        Home Oxygen   Home Oxygen Device None None      Sleep Oxygen Prescription None  None      Home Exercise Oxygen Prescription None None      Home Resting Oxygen Prescription None None      Compliance with Home Oxygen Use Yes --        Goals/Expected Outcomes   Short Term Goals To learn and understand importance of maintaining oxygen saturations>88%;To learn and exhibit compliance with exercise, home and travel O2 prescription;To learn and demonstrate proper use of respiratory medications;To learn and demonstrate proper pursed lip breathing techniques or other breathing techniques. ;To learn and understand importance of monitoring SPO2 with pulse oximeter and demonstrate accurate use of the pulse oximeter. To learn and demonstrate proper pursed lip breathing techniques or other breathing techniques.       Long  Term Goals Verbalizes importance of monitoring SPO2 with pulse oximeter and return demonstration;Exhibits proper breathing techniques, such as pursed lip breathing or other method taught during program session;Demonstrates proper use of MDI's;Compliance with  respiratory medication;Maintenance of O2 saturations>88%;Exhibits compliance with exercise, home  and travel O2 prescription Exhibits proper breathing techniques, such as pursed lip breathing or other method taught during program session      Comments Reviewed PLB technique with pt.  Talked about how it works and it's importance in maintaining their exercise saturations. Informed patient how to perform the Pursed Lipped breathing technique. Told patient to Inhale through the nose and out the mouth with pursed lips to keep their airways open, help oxygenate them better, practice when at rest or doing strenuous activity. Patient Verbalizes understanding of technique and will work on and be reiterated during [facility].      Goals/Expected Outcomes Short: Become more profiecient at using PLB. Long: Become independent at using PLB. Short: use PLB with exertion. Long: use PLB on exertion proficiently and independently.          Oxygen Discharge (Final Oxygen Re-Evaluation):  Oxygen Re-Evaluation - 03/18/24 0744       Program Oxygen Prescription   Program Oxygen Prescription None      Home Oxygen   Home Oxygen Device None    Sleep Oxygen Prescription None    Home Exercise Oxygen Prescription None    Home Resting Oxygen Prescription None      Goals/Expected Outcomes   Short Term Goals To learn and demonstrate proper pursed lip breathing techniques or other breathing techniques.     Long  Term Goals Exhibits proper breathing techniques, such as pursed lip breathing or other method taught during program session    Comments Informed patient how to perform the Pursed Lipped breathing technique. Told patient to Inhale through the nose and out the mouth with pursed lips to keep their airways open, help oxygenate them better, practice when at rest or doing strenuous activity. Patient Verbalizes understanding of technique and will work on and be reiterated during [facility].    Goals/Expected Outcomes Short: use PLB with exertion. Long: use PLB on exertion proficiently and independently.          Initial Exercise Prescription:  Initial Exercise Prescription - 02/16/24 1000       Date of Initial Exercise RX and Referring Provider   Date 02/16/24    Referring Provider Dr. Terrye Ruth, MD      Oxygen   Maintain Oxygen Saturation 88% or higher      NuStep   Level 3    SPM 80    Minutes 15    METs 3.4      REL-XR   Level 2    Speed 50    Minutes 15    METs 3.4      Biostep-RELP   Level 2    SPM 50    Minutes 15    METs 3.4      Track   Laps 32    Minutes 15    METs 2.74      Prescription Details   Frequency (times per week) 2    Duration Progress to 30 minutes of continuous aerobic without signs/symptoms of physical distress      Intensity   THRR 40-80% of Max Heartrate 103-135    Ratings of Perceived Exertion 11-13    Perceived Dyspnea 0-4      Progression   Progression Continue to  progress workloads to maintain intensity without signs/symptoms of physical distress.      Resistance Training   Training Prescription Yes    Weight 4 lb    Reps 10-15  Perform Capillary Blood Glucose checks as needed.  Exercise Prescription Changes:   Exercise Prescription Changes     Row Name 02/16/24 1000 03/16/24 0800 03/23/24 1100 04/06/24 1300       Response to Exercise   Blood Pressure (Admit) 112/66 -- 122/60 128/68    Blood Pressure (Exercise) 164/72 -- 160/80 160/88    Blood Pressure (Exit) 152/70 -- 112/70 128/70    Heart Rate (Admit) 72 bpm -- 91 bpm 81 bpm    Heart Rate (Exercise) 103 bpm -- 125 bpm 107 bpm    Heart Rate (Exit) 89 bpm -- 99 bpm 102 bpm    Oxygen Saturation (Admit) 96 % -- 94 % 91 %    Oxygen Saturation (Exercise) 87 % -- 92 % 90 %    Oxygen Saturation (Exit) 96 % -- 96 % 95 %    Rating of Perceived Exertion (Exercise) 15 -- 13 13    Perceived Dyspnea (Exercise) 3 -- 3 2    Symptoms none -- none none    Comments Results -- first 2 weeks of exercise --    Duration -- -- Progress to 30 minutes of  aerobic without signs/symptoms of physical distress Progress to 30 minutes of  aerobic without signs/symptoms of physical distress    Intensity -- -- THRR unchanged THRR unchanged      Progression   Progression -- -- Continue to progress workloads to maintain intensity without signs/symptoms of physical distress. Continue to progress workloads to maintain intensity without signs/symptoms of physical distress.    Average METs -- -- 2.43 2.9      Resistance Training   Training Prescription -- -- Yes Yes    Weight -- -- 4lb 4 lb    Reps -- -- 10-15 10-15      Interval Training   Interval Training -- -- No No      Recumbant Bike   Level -- -- -- 4    Watts -- -- -- 23    Minutes -- -- -- 15    METs -- -- -- 3.1      NuStep   Level -- -- 3 3    Minutes -- -- 15 15    METs -- -- 2.2 2.6      REL-XR   Level -- -- 3 3    Minutes  -- -- 15 15    METs -- -- 3.2 3      Biostep-RELP   Level -- -- 2 --    Minutes -- -- 15 --    METs -- -- 2 --      Track   Laps -- -- 24 --    Minutes -- -- 15 --    METs -- -- 2.31 --      Home Exercise Plan   Plans to continue exercise at -- Home (comment)  Walking, pedaler, resistance bands, and handweights Home (comment)  Walking, pedaler, resistance bands, and handweights Home (comment)  Walking, pedaler, resistance bands, and handweights    Frequency -- Add 1 additional day to program exercise sessions. Add 1 additional day to program exercise sessions. Add 1 additional day to program exercise sessions.    Initial Home Exercises Provided -- 03/16/24 03/16/24 03/16/24      Oxygen   Maintain Oxygen Saturation -- -- 88% or higher 88% or higher       Exercise Comments:   Exercise Comments     Row Name 03/09/24 [phone]  Exercise Comments First full day of exercise!  Patient was oriented to gym and equipment including functions, settings, policies, and procedures.  Patient's individual exercise prescription and treatment plan were reviewed.  All starting workloads were established based on the results of the 6 minute walk test done at initial orientation visit.  The plan for exercise progression was also introduced and progression will be customized based on patient's performance and goals.          Exercise Goals and Review:   Exercise Goals     Row Name 02/16/24 1022             Exercise Goals   Increase Physical Activity Yes       Intervention Provide advice, education, support and counseling about physical activity/exercise needs.;Develop an individualized exercise prescription for aerobic and resistive training based on initial evaluation findings, risk stratification, comorbidities and participant's personal goals.       Expected Outcomes Short Term: Attend rehab on a regular basis to increase amount of physical activity.;Long Term: Add in home exercise  to make exercise part of routine and to increase amount of physical activity.;Long Term: Exercising regularly at least 3-5 days a week.       Increase Strength and Stamina Yes       Intervention Provide advice, education, support and counseling about physical activity/exercise needs.;Develop an individualized exercise prescription for aerobic and resistive training based on initial evaluation findings, risk stratification, comorbidities and participant's personal goals.       Expected Outcomes Short Term: Increase workloads from initial exercise prescription for resistance, speed, and METs.;Short Term: Perform resistance training exercises routinely during rehab and add in resistance training at home;Long Term: Improve cardiorespiratory fitness, muscular endurance and strength as measured by increased METs and functional capacity ( )       Able to understand and use rate of perceived exertion (RPE) scale Yes       Intervention Provide education and explanation on how to use RPE scale       Expected Outcomes Short Term: Able to use RPE daily in rehab to express subjective intensity level;Long Term:  Able to use RPE to guide intensity level when exercising independently       Able to understand and use Dyspnea scale Yes       Intervention Provide education and explanation on how to use Dyspnea scale       Expected Outcomes Short Term: Able to use Dyspnea scale daily in rehab to express subjective sense of shortness of breath during exertion;Long Term: Able to use Dyspnea scale to guide intensity level when exercising independently       Knowledge and understanding of Target Heart Rate Range (THRR) Yes       Intervention Provide education and explanation of THRR including how the numbers were predicted and where they are located for reference       Expected Outcomes Long Term: Able to use THRR to govern intensity when exercising independently;Short Term: Able to state/look up THRR;Short Term: Able to use  daily as guideline for intensity in rehab       Able to check pulse independently Yes       Intervention Provide education and demonstration on how to check pulse in carotid and radial arteries.;Review the importance of being able to check your own pulse for safety during independent exercise       Expected Outcomes Short Term: Able to explain why pulse checking is important during independent exercise;Long Term: Able  to check pulse independently and accurately       Understanding of Exercise Prescription Yes       Intervention Provide education, explanation, and written materials on patient's individual exercise prescription       Expected Outcomes Short Term: Able to explain program exercise prescription;Long Term: Able to explain home exercise prescription to exercise independently          Exercise Goals Re-Evaluation :  Exercise Goals Re-Evaluation     Row Name 03/09/24 0736 03/16/24 0811 03/23/24 1117 04/06/24 1318 04/22/24 1406     Exercise Goal Re-Evaluation   Exercise Goals Review Increase Physical Activity;Able to understand and use rate of perceived exertion (RPE) scale;Knowledge and understanding of Target Heart Rate Range (THRR);Understanding of Exercise Prescription;Increase Strength and Stamina;Able to understand and use Dyspnea scale;Able to check pulse independently Increase Physical Activity;Able to understand and use Dyspnea scale;Understanding of Exercise Prescription;Increase Strength and Stamina;Knowledge and understanding of Target Heart Rate Range (THRR);Able to understand and use rate of perceived exertion (RPE) scale;Able to check pulse independently Increase Physical Activity;Increase Strength and Stamina;Understanding of Exercise Prescription Increase Physical Activity;Increase Strength and Stamina;Understanding of Exercise Prescription Increase Physical Activity;Increase Strength and Stamina;Understanding of Exercise Prescription   Comments Reviewed RPE and dyspnea  scale, THR and program prescription with pt today.  Pt voiced understanding and was given a copy of goals to take home. Reviewed home exercise with pt today from 7:50 to 7:59am.  Pt plans to walk outside, use his pedaler, and use handweights and resistance bands for exercise. He plans to add 1 additional day at home to start and progress to 2-3 days when he feels good based on his breathing.  Reviewed THR, pulse, RPE, sign and symptoms, pulse oximetery and when to call 911 or MD.  Also discussed weather considerations and indoor options.  Pt voiced understanding. Garrel is doing wel in rehab. He was able to attend his first 4 sessions of the program during this review period. During his first few sessions he was able to increase to level 3 on the XR and walk 24 laps on the track. We will continue to monitor his progress in the program. Garrel is doing well in rehab and has only attended two sessions since the last review. He continues to work at level 3 on the T4 nustep and the XR. He also began using the recumbent bike and did well at level 4. We will continue to monitor his progress in the program. Garrel has not attended rehab since the last review. We will continue to monitor his progress when he returns to regular attendance in the program.   Expected Outcomes Short: Use RPE daily to regulate intensity. Long: Follow program prescription in THR. Short: Add 1 additional day of exercise at home. Long: Continue to exercise independently at home Short: Continue to follow exercise prescription. Long: Continue exercise to improve strength and stamina. Short: Attend rehab more consistently. Long: Continue exercise to improve strength and stamina. Short: Return to regular attendance in rehab. Long: Graduate.      Discharge Exercise Prescription (Final Exercise Prescription Changes):  Exercise Prescription Changes - 04/06/24 1300       Response to Exercise   Blood Pressure (Admit) 128/68    Blood Pressure (Exercise)  160/88    Blood Pressure (Exit) 128/70    Heart Rate (Admit) 81 bpm    Heart Rate (Exercise) 107 bpm    Heart Rate (Exit) 102 bpm    Oxygen Saturation (Admit) 91 %  Oxygen Saturation (Exercise) 90 %    Oxygen Saturation (Exit) 95 %    Rating of Perceived Exertion (Exercise) 13    Perceived Dyspnea (Exercise) 2    Symptoms none    Duration Progress to 30 minutes of  aerobic without signs/symptoms of physical distress    Intensity THRR unchanged      Progression   Progression Continue to progress workloads to maintain intensity without signs/symptoms of physical distress.    Average METs 2.9      Resistance Training   Training Prescription Yes    Weight 4 lb    Reps 10-15      Interval Training   Interval Training No      Recumbant Bike   Level 4    Watts 23    Minutes 15    METs 3.1      NuStep   Level 3    Minutes 15    METs 2.6      REL-XR   Level 3    Minutes 15    METs 3      Home Exercise Plan   Plans to continue exercise at Home (comment)   Walking, pedaler, resistance bands, and handweights   Frequency Add 1 additional day to program exercise sessions.    Initial Home Exercises Provided 03/16/24      Oxygen   Maintain Oxygen Saturation 88% or higher          Nutrition:  Target Goals: Understanding of nutrition guidelines, daily intake of sodium 1500mg , cholesterol 200mg , calories 30% from fat and 7% or less from saturated fats, daily to have 5 or more servings of fruits and vegetables.  Education: Nutrition 1 -Group instruction provided by verbal, written material, interactive activities, discussions, models, and posters to present general guidelines for heart healthy nutrition including macronutrients, label reading, and promoting whole foods over processed counterparts. Education serves as Pensions consultant of discussion of heart healthy eating for all. Written material provided at class time.     Education: Nutrition 2 -Group instruction provided  by verbal, written material, interactive activities, discussions, models, and posters to present general guidelines for heart healthy nutrition including sodium, cholesterol, and saturated fat. Providing guidance of habit forming to improve blood pressure, cholesterol, and body weight. Written material provided at class time.     Biometrics:  Pre Biometrics - 02/16/24 1021       Pre Biometrics   Height 5' 11.25 (1.81 m)    Weight 158 lb 4.8 oz (71.8 kg)    Waist Circumference 36 inches    Hip Circumference 35 inches    Waist to Hip Ratio 1.03 %    BMI (Calculated) 21.92    Single Leg Stand 30 seconds           Nutrition Therapy Plan and Nutrition Goals:  Nutrition Therapy & Goals - 02/16/24 1017       Nutrition Therapy   RD appointment deferred Yes      Intervention Plan   Intervention Prescribe, educate and counsel regarding individualized specific dietary modifications aiming towards targeted core components such as weight, hypertension, lipid management, diabetes, heart failure and other comorbidities.    Expected Outcomes Short Term Goal: Understand basic principles of dietary content, such as calories, fat, sodium, cholesterol and nutrients.;Short Term Goal: A plan has been developed with personal nutrition goals set during dietitian appointment.;Long Term Goal: Adherence to prescribed nutrition plan.          Nutrition Assessments:  MEDIFICTS  Score Key: >=70 Need to make dietary changes  40-70 Heart Healthy Diet <= 40 Therapeutic Level Cholesterol Diet  Flowsheet Row Pulmonary Rehab from 02/16/2024 in [facility]monary Rehab  Picture Your Plate Total Score on Admission 55   Picture Your Plate Scores: <59 Unhealthy dietary pattern with much room for improvement. 41-50 Dietary pattern unlikely to meet recommendations for good health and room for improvement. 51-60 More healthful dietary pattern, with some room for improvement.  >60 Healthy dietary  pattern, although there may be some specific behaviors that could be improved.   Nutrition Goals Re-Evaluation:  Nutrition Goals Re-Evaluation     Row Name 03/18/24 0746             Goals   Comment Patient was informed on why it is important to maintain a balanced diet when dealing with Respiratory issues. Explained that it takes a lot of energy to breath and when they are short of breath often they will need to have a good diet to help keep up with the calories they are expending for breathing.       Expected Outcome Short: Choose and plan snacks accordingly to patients caloric intake to improve breathing. Long: Maintain a diet independently that meets their caloric intake to aid in daily shortness of breath.          Nutrition Goals Discharge (Final Nutrition Goals Re-Evaluation):  Nutrition Goals Re-Evaluation - 03/18/24 0746       Goals   Comment Patient was informed on why it is important to maintain a balanced diet when dealing with Respiratory issues. Explained that it takes a lot of energy to breath and when they are short of breath often they will need to have a good diet to help keep up with the calories they are expending for breathing.    Expected Outcome Short: Choose and plan snacks accordingly to patients caloric intake to improve breathing. Long: Maintain a diet independently that meets their caloric intake to aid in daily shortness of breath.          Psychosocial: Target Goals: Acknowledge presence or absence of significant depression and/or stress, maximize coping skills, provide positive support system. Participant is able to verbalize types and ability to use techniques and skills needed for reducing stress and depression.   Education: Stress, Anxiety, and Depression - Group verbal and visual presentation to define topics covered.  Reviews how body is impacted by stress, anxiety, and depression.  Also discusses healthy ways to reduce stress and to treat/manage  anxiety and depression.  Written material provided at class time. Flowsheet Row Pulmonary Rehab from 10/18/2021 in [facility] Cardiac and Pulmonary Rehab  Date 06/21/21  Educator [facility]  Instruction Review Code 1- [employer] Understanding    Education: Sleep Hygiene -Provides group verbal and written instruction about how sleep can affect your health.  Define sleep hygiene, discuss sleep cycles and impact of sleep habits. Review good sleep hygiene tips.    Initial Review & Psychosocial Screening:  Initial Psych Review & Screening - 02/12/24 1015       Initial Review   Current issues with None Identified      Family Dynamics   Good Support System? Yes    Comments Garrel can look to his wife, daughter and friends for support.      Barriers   Psychosocial barriers to participate in program The patient should benefit from training in stress management and relaxation.;There are no identifiable barriers or psychosocial needs.  Screening Interventions   Interventions Provide feedback about the scores to participant;To provide support and resources with identified psychosocial needs;Encouraged to exercise    Expected Outcomes Short Term goal: Utilizing psychosocial counselor, staff and physician to assist with identification of specific Stressors or current issues interfering with healing process. Setting desired goal for each stressor or current issue identified.;Long Term Goal: Stressors or current issues are controlled or eliminated.;Short Term goal: Identification and review with participant of any Quality of Life or Depression concerns found by scoring the questionnaire.;Long Term goal: The participant improves quality of Life and PHQ9 Scores as seen by post scores and/or verbalization of changes          Quality of Life Scores:  Scores of 19 and below usually indicate a poorer quality of life in these areas.  A difference of  2-3 points is a clinically meaningful difference.  A difference of  2-3 points in the total score of the Quality of Life Index has been associated with significant improvement in overall quality of life, self-image, physical symptoms, and general health in studies assessing change in quality of life.  PHQ-9: Review Flowsheet       02/16/2024 10/18/2021 06/21/2021 05/24/2021  Depression screen PHQ 2/9  Decreased Interest 1 0 1 1  Down, Depressed, Hopeless 0 0 0 0  PHQ - 2 Score 1 0 1 1  Altered sleeping 0 0 0 1  Tired, decreased energy 2 0 1 2  Change in appetite 0 0 0 0  Feeling bad or failure about yourself  0 0 0 0  Trouble concentrating 0 0 0 0  Moving slowly or fidgety/restless 1 0 1 1  Suicidal thoughts 0 0 0 0  PHQ-9 Score 4 0 3 5  Difficult doing work/chores Not difficult at all Somewhat difficult Somewhat difficult Not difficult at all   Interpretation of Total Score  Total Score Depression Severity:  1-4 = Minimal depression, 5-9 = Mild depression, 10-14 = Moderate depression, 15-19 = Moderately severe depression, 20-27 = Severe depression   Psychosocial Evaluation and Intervention:  Psychosocial Evaluation - 02/12/24 1016       Psychosocial Evaluation & Interventions   Interventions Encouraged to exercise with the program and follow exercise prescription;Relaxation education;Stress management education    Comments Garrel can look to his wife, daughter and friends for support.    Expected Outcomes Short: Start [facility] to help with mood. Long: Maintain a healthy mental state    Continue Psychosocial Services  Follow up required by staff          Psychosocial Re-Evaluation:  Psychosocial Re-Evaluation     Row Name 03/18/24 [phone]             Psychosocial Re-Evaluation   Current issues with None Identified       Comments Patient reports no issues with their current mental states, sleep, stress, depression or anxiety. Will follow up with patient in a few weeks for any changes.       Expected Outcomes Short: Continue to exercise  regularly to support mental health and notify staff of any changes. Long: maintain mental health and well being through teaching of rehab or prescribed medications independently.       Interventions Encouraged to attend Pulmonary Rehabilitation for the exercise       Continue Psychosocial Services  Follow up required by staff          Psychosocial Discharge (Final Psychosocial Re-Evaluation):  Psychosocial Re-Evaluation - 03/18/24 9252  Psychosocial Re-Evaluation   Current issues with None Identified    Comments Patient reports no issues with their current mental states, sleep, stress, depression or anxiety. Will follow up with patient in a few weeks for any changes.    Expected Outcomes Short: Continue to exercise regularly to support mental health and notify staff of any changes. Long: maintain mental health and well being through teaching of rehab or prescribed medications independently.    Interventions Encouraged to attend Pulmonary Rehabilitation for the exercise    Continue Psychosocial Services  Follow up required by staff          Education: Education Goals: Education classes will be provided on a weekly basis, covering required topics. Participant will state understanding/return demonstration of topics presented.  Learning Barriers/Preferences:  Learning Barriers/Preferences - 02/12/24 1013       Learning Barriers/Preferences   Learning Barriers Hearing    Learning Preferences None          General Pulmonary Education Topics:  Infection Prevention: - Provides verbal and written material to individual with discussion of infection control including proper hand washing and proper equipment cleaning during exercise session. Flowsheet Row Pulmonary Rehab from 02/16/2024 in [facility] Cardiac and Pulmonary Rehab  Date 02/12/24  Educator [facility]  Instruction Review Code 1- Verbalizes Understanding    Falls Prevention: - Provides verbal and written material to individual with  discussion of falls prevention and safety. Flowsheet Row Pulmonary Rehab from 02/16/2024 in [facility] Cardiac and Pulmonary Rehab  Date 02/12/24  Educator [facility]  Instruction Review Code 1- Verbalizes Understanding    Chronic Lung Disease Review: - Group verbal instruction with posters, models, PowerPoint presentations and videos,  to review new updates, new respiratory medications, new advancements in procedures and treatments. Providing information on websites and 800 numbers for continued self-education. Includes information about supplement oxygen, available portable oxygen systems, continuous and intermittent flow rates, oxygen safety, concentrators, and Medicare reimbursement for oxygen. Explanation of Pulmonary Drugs, including class, frequency, complications, importance of spacers, rinsing mouth after steroid MDI's, and proper cleaning methods for nebulizers. Review of basic lung anatomy and physiology related to function, structure, and complications of lung disease. Review of risk factors. Discussion about methods for diagnosing sleep apnea and types of masks and machines for OSA. Includes a review of the use of types of environmental controls: home humidity, furnaces, filters, dust mite/pet prevention, HEPA vacuums. Discussion about weather changes, air quality and the benefits of nasal washing. Instruction on Warning signs, infection symptoms, calling MD promptly, preventive modes, and value of vaccinations. Review of effective airway clearance, coughing and/or vibration techniques. Emphasizing that all should Create an Action Plan. Written material provided at class time. Flowsheet Row Pulmonary Rehab from 02/16/2024 in [facility] Cardiac and Pulmonary Rehab  Education need identified 02/16/24    AED/CPR: - Group verbal and written instruction with the use of models to demonstrate the basic use of the AED with the basic ABC's of resuscitation.    Tests and Procedures:  - Group verbal and visual  presentation and models provide information about basic cardiac anatomy and function. Reviews the testing methods done to diagnose heart disease and the outcomes of the test results. Describes the treatment choices: Medical Management, Angioplasty, or Coronary Bypass Surgery for treating various heart conditions including Myocardial Infarction, Angina, Valve Disease, and Cardiac Arrhythmias.  Written material provided at class time.   Medication Safety: - Group verbal and visual instruction to review commonly prescribed medications for heart and lung disease. Reviews the  medication, class of the drug, and side effects. Includes the steps to properly store meds and maintain the prescription regimen.  Written material given at graduation. Flowsheet Row Pulmonary Rehab from 10/18/2021 in [facility] Cardiac and Pulmonary Rehab  Date 09/27/21  Educator [facility]  Instruction Review Code 1- Verbalizes Understanding    Other: -Provides group and verbal instruction on various topics (see comments)   Knowledge Questionnaire Score:  Knowledge Questionnaire Score - 02/16/24 1016       Knowledge Questionnaire Score   Pre Score 17/18           Core Components/Risk Factors/Patient Goals at Admission:  Personal Goals and Risk Factors at Admission - 02/12/24 1014       Core Components/Risk Factors/Patient Goals on Admission    Weight Management Yes;Weight Maintenance    Intervention Weight Management: Develop a combined nutrition and exercise program designed to reach desired caloric intake, while maintaining appropriate intake of nutrient and fiber, sodium and fats, and appropriate energy expenditure required for the weight goal.;Weight Management: Provide education and appropriate resources to help participant work on and attain dietary goals.;Weight Management/Obesity: Establish reasonable short term and long term weight goals.    Expected Outcomes Short Term: Continue to assess and modify interventions until  short term weight is achieved;Weight Maintenance: Understanding of the daily nutrition guidelines, which includes 25-35% calories from fat, 7% or less cal from saturated fats, less than 200mg  cholesterol, less than 1.5gm of sodium, & 5 or more servings of fruits and vegetables daily;Understanding recommendations for meals to include 15-35% energy as protein, 25-35% energy from fat, 35-60% energy from carbohydrates, less than 200mg  of dietary cholesterol, 20-35 gm of total fiber daily;Understanding of distribution of calorie intake throughout the day with the consumption of 4-5 meals/snacks    Improve shortness of breath with ADL's Yes    Intervention Provide education, individualized exercise plan and daily activity instruction to help decrease symptoms of SOB with activities of daily living.    Expected Outcomes Short Term: Improve cardiorespiratory fitness to achieve a reduction of symptoms when performing ADLs;Long Term: Be able to perform more ADLs without symptoms or delay the onset of symptoms    Hypertension Yes    Intervention Provide education on lifestyle modifcations including regular physical activity/exercise, weight management, moderate sodium restriction and increased consumption of fresh fruit, vegetables, and low fat dairy, alcohol moderation, and smoking cessation.;Monitor prescription use compliance.    Expected Outcomes Short Term: Continued assessment and intervention until BP is < 140/63mm HG in hypertensive participants. < 130/51mm HG in hypertensive participants with diabetes, heart failure or chronic kidney disease.;Long Term: Maintenance of blood pressure at goal levels.          Education:Diabetes - Individual verbal and written instruction to review signs/symptoms of diabetes, desired ranges of glucose level fasting, after meals and with exercise. Acknowledge that pre and post exercise glucose checks will be done for 3 sessions at entry of program.   Know Your Numbers and  Heart Failure: - Group verbal and visual instruction to discuss disease risk factors for cardiac and pulmonary disease and treatment options.  Reviews associated critical values for Overweight/Obesity, Hypertension, Cholesterol, and Diabetes.  Discusses basics of heart failure: signs/symptoms and treatments.  Introduces Heart Failure Zone chart for action plan for heart failure. Written material provided at class time. Flowsheet Row Pulmonary Rehab from 10/18/2021 in [facility] Cardiac and Pulmonary Rehab  Date 10/11/21  Educator [facility]  Instruction Review Code 1- Verbalizes Understanding    Core  Components/Risk Factors/Patient Goals Review:   Goals and Risk Factor Review     Row Name 03/18/24 0745             Core Components/Risk Factors/Patient Goals Review   Personal Goals Review Improve shortness of breath with ADL's       Review Spoke to patient about their shortness of breath and what they can do to improve. Patient has been informed of breathing techniques when starting the program. Patient is informed to tell staff if they have had any med changes and that certain meds they are taking or not taking can be causing shortness of breath.       Expected Outcomes Short: Attend [facility] regularly to improve shortness of breath with ADL's. Long: maintain independence with ADL's          Core Components/Risk Factors/Patient Goals at Discharge (Final Review):   Goals and Risk Factor Review - 03/18/24 0745       Core Components/Risk Factors/Patient Goals Review   Personal Goals Review Improve shortness of breath with ADL's    Review Spoke to patient about their shortness of breath and what they can do to improve. Patient has been informed of breathing techniques when starting the program. Patient is informed to tell staff if they have had any med changes and that certain meds they are taking or not taking can be causing shortness of breath.    Expected Outcomes Short: Attend [facility] regularly to  improve shortness of breath with ADL's. Long: maintain independence with ADL's          ITP Comments:  ITP Comments     Row Name 02/12/24 1020 02/16/24 1011 03/09/24 0736 03/10/24 1046 04/07/24 1002   ITP Comments Virtual Visit completed. Patient informed on EP and RD appointment and 6 Minute walk test. Patient also informed of patient health questionnaires on My Chart. Patient Verbalizes understanding. Visit diagnosis can be found in [facility] media. [facility]. Completed and gym orientation for pulmonary rehab. Initial ITP created and sent for review to Dr. Faud Aleskerov, Medical Director. First full day of exercise!  Patient was oriented to gym and equipment including functions, settings, policies, and procedures.  Patient's individual exercise prescription and treatment plan were reviewed.  All starting workloads were established based on the results of the 6 minute walk test done at initial orientation visit.  The plan for exercise progression was also introduced and progression will be customized based on patient's performance and goals. 30 Day review completed. Medical Director ITP review done, changes made as directed, and signed approval by Medical Director. 30 Day review completed. Medical Director ITP review done, changes made as directed, and signed approval by Medical Director.    Row Name 04/28/24 1103           ITP Comments Patient contacted staff requesting to be discharged at this time. With the heat and humidity, he feels like this is not the best time for him to participate and wants to start when the weather has changed and it is easier for him to breathe. He will contact his doctor at the [facility] to inform them of this decision and request a new referral when ready to start. He compelted 8 of 36 sessions.          Comments: Early Discharge ITP

## 2024-04-29 ENCOUNTER — Encounter

## 2024-05-04 ENCOUNTER — Encounter

## 2024-05-06 ENCOUNTER — Encounter

## 2024-05-11 ENCOUNTER — Encounter

## 2024-05-13 ENCOUNTER — Encounter

## 2024-05-18 ENCOUNTER — Encounter

## 2024-05-20 ENCOUNTER — Encounter

## 2024-05-25 ENCOUNTER — Encounter

## 2024-05-27 ENCOUNTER — Encounter

## 2024-06-01 ENCOUNTER — Encounter

## 2024-06-03 ENCOUNTER — Encounter

## 2024-06-08 ENCOUNTER — Encounter

## 2024-06-10 ENCOUNTER — Encounter

## 2024-06-15 ENCOUNTER — Encounter

## 2024-06-17 ENCOUNTER — Encounter
